# Patient Record
Sex: Female | Born: 1945 | Race: Black or African American | Hispanic: No | Marital: Single | Smoking: Former smoker
Health system: Southern US, Community
[De-identification: ages and names within clinical notes are randomized; demographics above are authoritative.]

## PROBLEM LIST (undated history)

## (undated) DIAGNOSIS — F0390 Unspecified dementia without behavioral disturbance: Secondary | ICD-10-CM

## (undated) DIAGNOSIS — N200 Calculus of kidney: Secondary | ICD-10-CM

## (undated) DIAGNOSIS — F32A Depression, unspecified: Secondary | ICD-10-CM

## (undated) DIAGNOSIS — K259 Gastric ulcer, unspecified as acute or chronic, without hemorrhage or perforation: Secondary | ICD-10-CM

## (undated) DIAGNOSIS — G8929 Other chronic pain: Secondary | ICD-10-CM

## (undated) DIAGNOSIS — R519 Headache, unspecified: Secondary | ICD-10-CM

## (undated) DIAGNOSIS — G4730 Sleep apnea, unspecified: Secondary | ICD-10-CM

## (undated) DIAGNOSIS — F514 Sleep terrors [night terrors]: Secondary | ICD-10-CM

## (undated) DIAGNOSIS — F329 Major depressive disorder, single episode, unspecified: Secondary | ICD-10-CM

## (undated) HISTORY — PX: REPLACEMENT TOTAL KNEE: SUR1224

## (undated) HISTORY — PX: JOINT REPLACEMENT: SHX530

## (undated) HISTORY — PX: OTHER SURGICAL HISTORY: SHX169

## (undated) HISTORY — DX: Calculus of kidney: N20.0

## (undated) HISTORY — DX: Gastric ulcer, unspecified as acute or chronic, without hemorrhage or perforation: K25.9

## (undated) HISTORY — DX: Sleep terrors (night terrors): F51.4

## (undated) HISTORY — DX: Depression, unspecified: F32.A

## (undated) HISTORY — DX: Sleep apnea, unspecified: G47.30

## (undated) HISTORY — DX: Other chronic pain: G89.29

## (undated) HISTORY — DX: Headache, unspecified: R51.9

---
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
## 2003-04-17 ENCOUNTER — Ambulatory Visit (HOSPITAL_COMMUNITY): Payer: Self-pay | Admitting: INTERNAL MEDICINE

## 2011-10-01 DIAGNOSIS — R079 Chest pain, unspecified: Secondary | ICD-10-CM

## 2011-10-01 DIAGNOSIS — G43009 Migraine without aura, not intractable, without status migrainosus: Secondary | ICD-10-CM

## 2017-10-09 ENCOUNTER — Encounter (HOSPITAL_BASED_OUTPATIENT_CLINIC_OR_DEPARTMENT_OTHER): Payer: Self-pay | Admitting: Internal Medicine

## 2017-10-09 NOTE — Progress Notes (Signed)
FORWARDED TO GRIFFITH

## 2017-10-22 ENCOUNTER — Telehealth (HOSPITAL_BASED_OUTPATIENT_CLINIC_OR_DEPARTMENT_OTHER): Payer: Self-pay | Admitting: Internal Medicine

## 2017-11-24 ENCOUNTER — Encounter (HOSPITAL_BASED_OUTPATIENT_CLINIC_OR_DEPARTMENT_OTHER): Payer: Self-pay | Admitting: Internal Medicine

## 2017-11-24 NOTE — Progress Notes (Signed)
RSC SENT TO REFERRING OFFICE

## 2017-12-27 DIAGNOSIS — F0390 Unspecified dementia without behavioral disturbance: Secondary | ICD-10-CM

## 2018-12-07 ENCOUNTER — Inpatient Hospital Stay (HOSPITAL_COMMUNITY): Payer: Medicare Other

## 2018-12-07 ENCOUNTER — Other Ambulatory Visit: Payer: Self-pay

## 2018-12-07 ENCOUNTER — Inpatient Hospital Stay (HOSPITAL_COMMUNITY)
Admission: EM | Admit: 2018-12-07 | Discharge: 2018-12-09 | DRG: 661 | Disposition: A | Discharge status: Home / Self Care | Payer: Medicare Other | Attending: Internal Medicine | Admitting: Internal Medicine

## 2018-12-07 ENCOUNTER — Emergency Department (HOSPITAL_COMMUNITY): Payer: Medicare Other

## 2018-12-07 ENCOUNTER — Inpatient Hospital Stay (HOSPITAL_COMMUNITY): Payer: Medicare Other | Admitting: Certified Registered Nurse Anesthetist

## 2018-12-07 ENCOUNTER — Encounter (HOSPITAL_COMMUNITY)
Admission: EM | Disposition: A | Discharge status: Home / Self Care | Payer: Self-pay | Source: Home / Self Care | Attending: Internal Medicine

## 2018-12-07 ENCOUNTER — Inpatient Hospital Stay: Admit: 2018-12-07 | Payer: Medicare Other | Admitting: Urology

## 2018-12-07 ENCOUNTER — Encounter (HOSPITAL_COMMUNITY): Payer: Self-pay | Admitting: Emergency Medicine

## 2018-12-07 DIAGNOSIS — R0602 Shortness of breath: Secondary | ICD-10-CM | POA: Diagnosis present

## 2018-12-07 DIAGNOSIS — F0390 Unspecified dementia without behavioral disturbance: Secondary | ICD-10-CM | POA: Diagnosis present

## 2018-12-07 DIAGNOSIS — E119 Type 2 diabetes mellitus without complications: Secondary | ICD-10-CM | POA: Diagnosis present

## 2018-12-07 DIAGNOSIS — N200 Calculus of kidney: Secondary | ICD-10-CM

## 2018-12-07 DIAGNOSIS — N390 Urinary tract infection, site not specified: Secondary | ICD-10-CM

## 2018-12-07 DIAGNOSIS — N1330 Unspecified hydronephrosis: Secondary | ICD-10-CM | POA: Diagnosis not present

## 2018-12-07 DIAGNOSIS — N132 Hydronephrosis with renal and ureteral calculous obstruction: Secondary | ICD-10-CM | POA: Diagnosis present

## 2018-12-07 HISTORY — PX: CYSTOSCOPY WITH STENT PLACEMENT: SHX5790

## 2018-12-07 LAB — COMPREHENSIVE METABOLIC PANEL
ALT: 10 U/L (ref 0–44)
AST: 20 U/L (ref 15–41)
Albumin: 4.2 g/dL (ref 3.5–5.0)
Alkaline Phosphatase: 86 U/L (ref 38–126)
Anion gap: 13 (ref 5–15)
BUN: 8 mg/dL (ref 8–23)
CO2: 21 mmol/L — ABNORMAL LOW (ref 22–32)
Calcium: 9.5 mg/dL (ref 8.9–10.3)
Chloride: 105 mmol/L (ref 98–111)
Creatinine, Ser: 0.94 mg/dL (ref 0.44–1.00)
GFR calc non Af Amer: >60 mL/min (ref >60)
Glucose, Bld: 133 mg/dL — ABNORMAL HIGH (ref 70–99)
Potassium: 3.7 mmol/L (ref 3.5–5.1)
Sodium: 139 mmol/L (ref 135–145)
Total Bilirubin: 0.8 mg/dL (ref 0.3–1.2)
Total Protein: 7.7 g/dL (ref 6.5–8.1)

## 2018-12-07 LAB — GLUCOSE, CAPILLARY: GLUCOSE-CAPILLARY: 111 mg/dL — AB (ref 70–99)

## 2018-12-07 LAB — URINALYSIS, ROUTINE W REFLEX MICROSCOPIC
Bilirubin Urine: NEGATIVE
Glucose, UA: NEGATIVE mg/dL
Ketones, ur: 5 mg/dL — AB
Nitrite: NEGATIVE
Protein, ur: NEGATIVE mg/dL
RBC / HPF: >50 RBC/hpf — ABNORMAL HIGH (ref 0–5)
Specific Gravity, Urine: 1.014 (ref 1.005–1.030)
pH: 8 (ref 5.0–8.0)

## 2018-12-07 LAB — CBC
HCT: 41 % (ref 36.0–46.0)
Hemoglobin: 13.1 g/dL (ref 12.0–15.0)
MCH: 29.2 pg (ref 26.0–34.0)
MCHC: 32 g/dL (ref 30.0–36.0)
MCV: 91.3 fL (ref 80.0–100.0)
Platelets: 209 10*3/uL (ref 150–400)
RBC: 4.49 MIL/uL (ref 3.87–5.11)
RDW: 14.5 % (ref 11.5–15.5)
WBC: 5.3 10*3/uL (ref 4.0–10.5)
nRBC: 0 % (ref 0.0–0.2)

## 2018-12-07 LAB — I-STAT TROPONIN, ED: Troponin i, poc: 0 ng/mL (ref 0.00–0.08)

## 2018-12-07 LAB — LIPASE, BLOOD: Lipase: 37 U/L (ref 11–51)

## 2018-12-07 SURGERY — CYSTOSCOPY, WITH STENT INSERTION
Anesthesia: General | Site: Ureter | Laterality: Right

## 2018-12-07 MED ORDER — SODIUM CHLORIDE 0.9 % IV SOLN
INTRAVENOUS | Status: DC
  Administered 2018-12-08 (×2): via INTRAVENOUS

## 2018-12-07 MED ORDER — FENTANYL CITRATE (PF) 100 MCG/2ML IJ SOLN
INTRAMUSCULAR | Status: AC
  Filled 2018-12-07: qty 2

## 2018-12-07 MED ORDER — LIDOCAINE 2% (20 MG/ML) 5 ML SYRINGE
INTRAMUSCULAR | Status: DC | PRN
  Administered 2018-12-07: 100 mg via INTRAVENOUS

## 2018-12-07 MED ORDER — ONDANSETRON HCL 4 MG PO TABS: 4.0000 mg | ORAL_TABLET | Freq: Four times a day (QID) | ORAL | Status: DC | PRN

## 2018-12-07 MED ORDER — HYDROMORPHONE HCL 1 MG/ML IJ SOLN: 0.5000 mg | INTRAMUSCULAR | Status: DC | PRN

## 2018-12-07 MED ORDER — IOHEXOL 300 MG/ML  SOLN
INTRAMUSCULAR | Status: DC | PRN
  Administered 2018-12-07: 7 mL via URETHRAL

## 2018-12-07 MED ORDER — SODIUM CHLORIDE 0.9 % IV SOLN
2.0000 g | Freq: Once | INTRAVENOUS | Status: AC
  Administered 2018-12-07: 2 g via INTRAVENOUS
  Filled 2018-12-07: qty 20

## 2018-12-07 MED ORDER — OXYCODONE-ACETAMINOPHEN 5-325 MG PO TABS: 1.0000 | ORAL_TABLET | Freq: Four times a day (QID) | ORAL | Status: DC | PRN

## 2018-12-07 MED ORDER — ONDANSETRON 4 MG PO TBDP
4.0000 mg | ORAL_TABLET | Freq: Once | ORAL | Status: AC
  Administered 2018-12-07: 4 mg via ORAL
  Filled 2018-12-07: qty 1

## 2018-12-07 MED ORDER — PHENAZOPYRIDINE HCL 200 MG PO TABS: 200.0000 mg | ORAL_TABLET | Freq: Three times a day (TID) | ORAL | 0 refills | Status: DC | PRN

## 2018-12-07 MED ORDER — FENTANYL CITRATE (PF) 100 MCG/2ML IJ SOLN
INTRAMUSCULAR | Status: DC | PRN
  Administered 2018-12-07: 50 ug via INTRAVENOUS

## 2018-12-07 MED ORDER — ONDANSETRON HCL 4 MG/2ML IJ SOLN
INTRAMUSCULAR | Status: DC | PRN
  Administered 2018-12-07: 4 mg via INTRAVENOUS

## 2018-12-07 MED ORDER — PROPOFOL 10 MG/ML IV BOLUS
INTRAVENOUS | Status: DC | PRN
  Administered 2018-12-07: 130 mg via INTRAVENOUS

## 2018-12-07 MED ORDER — DEXAMETHASONE SODIUM PHOSPHATE 10 MG/ML IJ SOLN
INTRAMUSCULAR | Status: DC | PRN
  Administered 2018-12-07: 10 mg via INTRAVENOUS

## 2018-12-07 MED ORDER — FENTANYL CITRATE (PF) 100 MCG/2ML IJ SOLN
50.0000 ug | Freq: Once | INTRAMUSCULAR | Status: AC
  Administered 2018-12-07: 50 ug via SUBCUTANEOUS

## 2018-12-07 MED ORDER — IOHEXOL 300 MG/ML  SOLN
100.0000 mL | Freq: Once | INTRAMUSCULAR | Status: AC | PRN
  Administered 2018-12-07: 100 mL via INTRAVENOUS

## 2018-12-07 MED ORDER — HYDROMORPHONE HCL 1 MG/ML IJ SOLN
0.5000 mg | Freq: Once | INTRAMUSCULAR | Status: AC
  Administered 2018-12-07: 0.5 mg via INTRAVENOUS
  Filled 2018-12-07: qty 1

## 2018-12-07 MED ORDER — SODIUM CHLORIDE 0.9 % IV BOLUS
1000.0000 mL | Freq: Once | INTRAVENOUS | Status: AC
  Administered 2018-12-07: 1000 mL via INTRAVENOUS

## 2018-12-07 MED ORDER — OXYCODONE-ACETAMINOPHEN 5-325 MG PO TABS
1.0000 | ORAL_TABLET | ORAL | Status: DC | PRN
  Administered 2018-12-07: 1 via ORAL
  Filled 2018-12-07: qty 1

## 2018-12-07 MED ORDER — FENTANYL CITRATE (PF) 100 MCG/2ML IJ SOLN
25.0000 ug | Freq: Once | INTRAMUSCULAR | Status: DC
  Filled 2018-12-07: qty 2

## 2018-12-07 MED ORDER — DEXAMETHASONE SODIUM PHOSPHATE 10 MG/ML IJ SOLN
INTRAMUSCULAR | Status: AC
  Filled 2018-12-07: qty 1

## 2018-12-07 MED ORDER — LIDOCAINE 2% (20 MG/ML) 5 ML SYRINGE
INTRAMUSCULAR | Status: AC
  Filled 2018-12-07: qty 5

## 2018-12-07 MED ORDER — PROPOFOL 10 MG/ML IV BOLUS
INTRAVENOUS | Status: AC
  Filled 2018-12-07: qty 20

## 2018-12-07 MED ORDER — SODIUM CHLORIDE 0.9% FLUSH
3.0000 mL | Freq: Once | INTRAVENOUS | Status: AC
  Administered 2018-12-07: 3 mL via INTRAVENOUS

## 2018-12-07 MED ORDER — KETOROLAC TROMETHAMINE 30 MG/ML IJ SOLN
INTRAMUSCULAR | Status: AC
  Filled 2018-12-07: qty 1

## 2018-12-07 MED ORDER — ONDANSETRON HCL 4 MG/2ML IJ SOLN: 4.0000 mg | Freq: Four times a day (QID) | INTRAMUSCULAR | Status: DC | PRN

## 2018-12-07 MED ORDER — TRAMADOL HCL 50 MG PO TABS: 50.0000 mg | ORAL_TABLET | Freq: Four times a day (QID) | ORAL | 0 refills | Status: DC | PRN

## 2018-12-07 MED ORDER — ACETAMINOPHEN 650 MG RE SUPP: 650.0000 mg | Freq: Four times a day (QID) | RECTAL | Status: DC | PRN

## 2018-12-07 MED ORDER — ONDANSETRON HCL 4 MG/2ML IJ SOLN
INTRAMUSCULAR | Status: AC
  Filled 2018-12-07: qty 2

## 2018-12-07 MED ORDER — SODIUM CHLORIDE 0.9 % IR SOLN
Status: DC | PRN
  Administered 2018-12-07: 3000 mL

## 2018-12-07 MED ORDER — LACTATED RINGERS IV SOLN
INTRAVENOUS | Status: DC | PRN
  Administered 2018-12-07: 20:00:00 via INTRAVENOUS

## 2018-12-07 MED ORDER — ACETAMINOPHEN 325 MG PO TABS: 650.0000 mg | ORAL_TABLET | Freq: Four times a day (QID) | ORAL | Status: DC | PRN

## 2018-12-07 SURGICAL SUPPLY — 11 items
BAG URO CATCHER STRL LF (MISCELLANEOUS) ×2 IMPLANT
CATH URET 5FR 28IN OPEN ENDED (CATHETERS) ×2 IMPLANT
CLOTH BEACON ORANGE TIMEOUT ST (SAFETY) ×2 IMPLANT
COVER WAND RF STERILE (DRAPES) IMPLANT
GLOVE BIOGEL M STRL SZ7.5 (GLOVE) ×2 IMPLANT
GOWN STRL REUS W/TWL LRG LVL3 (GOWN DISPOSABLE) ×4 IMPLANT
GUIDEWIRE STR DUAL SENSOR (WIRE) ×2 IMPLANT
MANIFOLD NEPTUNE II (INSTRUMENTS) ×2 IMPLANT
PACK CYSTO (CUSTOM PROCEDURE TRAY) ×2 IMPLANT
STENT URET 6FRX24 CONTOUR (STENTS) ×2 IMPLANT
TUBING CONNECTING 10 (TUBING) IMPLANT

## 2018-12-07 NOTE — H&P (Signed)
History and Physical    Monica Erickson EHO:[id number] DOB: Apr 05, 1946 DOA: 12/07/2018  PCP: Patient, No Pcp Per  Patient coming from: Home.  I have personally briefly reviewed patient's old medical records available.   Chief Complaint: Right flank pain.  HPI: Monica Erickson is a 73 y.o. female with medical history significant of advanced dementia not on any treatment, no significant medical problems presenting to the emergency room with 2 days of severe right flank pain.  Patient is poor historian.  I discussed with her daughter for further history.  According to them, patient does not have any history of renal stones.  She does not have any history of abdominal pain.  Patient started complaining of right flank pain about 2 days ago, gradually worsening, severe, dull and occasionally radiating to right groin.  Patient states occasional burning of urine.  No fever or chills.  Patient was brought to the ER by her daughter today. No reported fever.  No reported nausea vomiting or any other symptoms. ED Course: Patient is hemodynamically stable.  Her blood pressures are slightly elevated.  WBC count is normal.  Patient is afebrile.  A CT scan of the abdomen and pelvis shows multiple right ureteric stone with severe hydronephrosis and inflammation on the right side.  Case was discussed with urology.  Patient was given a dose of Rocephin 2 g.  Patient is started on IV fluids.  She will be transferred to [facility] for cystoscopy and stent placement tonight.  Review of Systems: As per HPI otherwise 10 point review of systems negative.  Somehow limited with advanced dementia.   Past Medical History:  Diagnosis Date  . Diabetes mellitus without complication (HCC)     History reviewed. No pertinent surgical history.   reports that she has never smoked. She does not have any smokeless tobacco history on file. She reports that she does not drink alcohol or use drugs.  No Known Allergies  History reviewed.  No pertinent family history.  No family history of kidney stones, renal malignancies.   Prior to Admission medications   Not on File  Patient does not take any medicine at home.  Physical Exam: Vitals:   12/07/18 1315 12/07/18 1400 12/07/18 1415 12/07/18 1430  BP:  140/83 127/70 (!) 146/75  Pulse: 90 79 77 80  Resp: 20 18 19 19   Temp:      TempSrc:      SpO2: 100% 100% 94% 100%  Weight:      Height:        Constitutional: NAD, calm, comfortable Vitals:   12/07/18 1315 12/07/18 1400 12/07/18 1415 12/07/18 1430  BP:  140/83 127/70 (!) 146/75  Pulse: 90 79 77 80  Resp: 20 18 19 19   Temp:      TempSrc:      SpO2: 100% 100% 94% 100%  Weight:      Height:       Eyes: PERRL, lids and conjunctivae normal ENMT: Mucous membranes are moist. Posterior pharynx clear of any exudate or lesions.Normal dentition.  Neck: normal, supple, no masses, no thyromegaly Respiratory: clear to auscultation bilaterally, no wheezing, no crackles. Normal respiratory effort. No accessory muscle use.  Cardiovascular: Regular rate and rhythm, no murmurs / rubs / gallops. No extremity edema. 2+ pedal pulses. No carotid bruits.  Abdomen: Patient does have diffuse tenderness along the right upper quadrant and right flank with no rigidity or guarding.  No hepatosplenomegaly. Bowel sounds positive.  Musculoskeletal: no clubbing / cyanosis. No  joint deformity upper and lower extremities. Good ROM, no contractures. Normal muscle tone.  Skin: no rashes, lesions, ulcers. No induration Neurologic: CN 2-12 grossly intact. Sensation intact, DTR normal. Strength 5/5 in all 4.  Psychiatric: Normal judgment and insight. Alert and oriented x 1.  Anxious.    Labs on Admission: I have personally reviewed following labs and imaging studies  CBC: Recent Labs  Lab 12/07/18 1030  WBC 5.3  HGB 13.1  HCT 41.0  MCV 91.3  PLT 209   Basic Metabolic Panel: Recent Labs  Lab 12/07/18 1030  NA 139  K 3.7  CL 105    CO2 21*  GLUCOSE 133*  BUN 8  CREATININE 0.94  CALCIUM 9.5   GFR: Estimated Creatinine Clearance: 53.4 mL/min (by C-G formula based on SCr of 0.94 mg/dL). Liver Function Tests: Recent Labs  Lab 12/07/18 1030  AST 20  ALT 10  ALKPHOS 86  BILITOT 0.8  PROT 7.7  ALBUMIN 4.2   Recent Labs  Lab 12/07/18 1030  LIPASE 37   No results for input(s): AMMONIA in the last 168 hours. Coagulation Profile: No results for input(s): INR, PROTIME in the last 168 hours. Cardiac Enzymes: No results for input(s): CKTOTAL, CKMB, CKMBINDEX, TROPONINI in the last 168 hours. BNP (last 3 results) No results for input(s): PROBNP in the last 8760 hours. HbA1C: No results for input(s): HGBA1C in the last 72 hours. CBG: No results for input(s): GLUCAP in the last 168 hours. Lipid Profile: No results for input(s): CHOL, HDL, LDLCALC, TRIG, CHOLHDL, LDLDIRECT in the last 72 hours. Thyroid Function Tests: No results for input(s): TSH, T4TOTAL, FREET4, T3FREE, THYROIDAB in the last 72 hours. Anemia Panel: No results for input(s): VITAMINB12, FOLATE, FERRITIN, TIBC, IRON, RETICCTPCT in the last 72 hours. Urine analysis:    Component Value Date/Time   COLORURINE YELLOW 12/07/2018 1306   APPEARANCEUR HAZY (A) 12/07/2018 1306   LABSPEC 1.014 12/07/2018 1306   PHURINE 8.0 12/07/2018 1306   GLUCOSEU NEGATIVE 12/07/2018 1306   HGBUR MODERATE (A) 12/07/2018 1306   BILIRUBINUR NEGATIVE 12/07/2018 1306   KETONESUR 5 (A) 12/07/2018 1306   PROTEINUR NEGATIVE 12/07/2018 1306   NITRITE NEGATIVE 12/07/2018 1306   LEUKOCYTESUR TRACE (A) 12/07/2018 1306    Radiological Exams on Admission: Dg Chest 2 View  Result Date: 12/07/2018 CLINICAL DATA:  Severe RIGHT upper quadrant pain. Shortness of breath. EXAM: CHEST - 2 VIEW COMPARISON:  None. FINDINGS: Cardiac silhouette is mildly enlarged. Mediastinal silhouette is normal. Mild bronchitic changes. No pleural effusion or focal consolidation. LEFT lower lung  zone subpleural scarring. No pneumothorax. Soft tissue planes and included osseous structures are non suspicious. IMPRESSION: 1. Mild cardiomegaly. 2. Mild bronchitic changes without focal consolidation. Electronically Signed   By: Awilda Metro M.D.   On: 12/07/2018 13:46   Ct Abdomen Pelvis W Contrast  Result Date: 12/07/2018 CLINICAL DATA:  73 year old female with right flank and right lower quadrant pain. Initial encounter. EXAM: CT ABDOMEN AND PELVIS WITH CONTRAST TECHNIQUE: Multidetector CT imaging of the abdomen and pelvis was performed using the standard protocol following bolus administration of intravenous contrast. CONTRAST:  OMNIPAQUE IOHEXOL 300 MG/ML  SOLN COMPARISON:  None. FINDINGS: Lower chest: Basilar scarring/atelectasis. Heart size top-normal slightly enlarged. Slightly asymmetric breast parenchyma more notable on left. Hepatobiliary: Slightly enlarged liver spanning over 18.8 cm. No worrisome hepatic lesion noted. No calcified gallstone. Motion degradation limits evaluation for detection of gallbladder inflammation. Pancreas: No worrisome pancreatic mass or primary pancreatic inflammation. Spleen: No  splenic mass or enlargement. Splenule incidentally noted. Adrenals/Urinary Tract: 3 mm right ureteral vesicle obstructing stone. Additionally, 6 mm and 5 mm stone within the distal right ureter 6.2 cm proximal to the ureteral vesicle junction. Marked right-sided hydroureteronephrosis. Extravasation of urine surrounding the right kidney which appears diffusely edematous with delayed nephrogram. No worrisome renal or adrenal mass. Noncontrast filled imaging of the urinary bladder without mass identified. Stomach/Bowel: Diverticulosis most notable descending colon and sigmoid colon. No evidence of diverticulitis. Appendix not well delineated however no right lower lobe primary bowel inflammatory process noted. No obvious small bowel or gastric abnormality. Vascular/Lymphatic:  Atherosclerotic changes aorta without abdominal aortic aneurysm. No large vessel occlusion. Scattered normal size lymph nodes increased in number upper abdominal/peripancreatic region of indeterminate etiology. Reproductive: Appearance of prior hysterectomy. No worrisome adnexal lesion. Other: No free air or bowel containing hernia. Small fat containing umbilical hernia. Musculoskeletal: Mild scoliosis with superimposed degenerative changes most notable T11-12. IMPRESSION: 1. 3 mm right ureteral vesicle obstructing stone. Additionally, 6 mm and 5 mm stone within the distal right ureter 6.2 cm proximal to the ureteral vesicle junction. Marked right-sided hydroureteronephrosis. Extravasation of urine surrounding the right kidney which appears diffusely edematous with delayed nephrogram. 2. Diverticulosis most notable descending colon and sigmoid colon. No evidence of diverticulitis. Appendix not well delineated however no right lower lobe primary bowel inflammatory process noted. 3. Scattered normal size lymph nodes increased in number upper abdominal/peripancreatic region of indeterminate etiology. 4.  Aortic Atherosclerosis (ICD10-I70.0). 5. Slightly enlarged liver spanning over 18.8 cm. 6. Exam is motion degraded slightly limiting evaluation particularly in the upper abdomen. These results were called by telephone at the time of interpretation on 12/07/2018 at 3:05 pm to Dr. Kennis CarinaMICHAEL BERO , who verbally acknowledged these results. Electronically Signed   By: Lacy DuverneySteven  Olson M.D.   On: 12/07/2018 15:08    EKG: Independently reviewed. Normal sinus rhythm.  Low voltage EKG.  No acute ST-T wave changes.  Assessment/Plan Principal Problem:   Stone, kidney Active Problems:   Dementia without behavioral disturbance (HCC)   Obstructive ureteric stones with severe hydronephrosis: Agree with admission given severity of symptoms. Patient was given Rocephin 2 g after getting urine cultures and blood cultures.  I  discussed case with urology, they do not suggest to continue antibiotics. Continue IV fluids.  Keep n.p.o.  Adequate IV and oral opiates for pain relief. [facility].  Dementia without behavioral disturbances: Patient currently will controlled behavior.  She is not on any treatment.  Fall prevention.  Delirium precaution.  DVT prophylaxis: SCDs presurgery. Code Status: Full code. Family Communication: Discussed with daughter over the phone. Disposition Plan: Home in 24 to 48 hours. Consults called: Urology. Admission status: Inpatient.   Dorcas CarrowKuber Jeanene Mena MD [facility] Pager [phone]  If 7PM-7AM, please contact night-coverage [email or web address] Password [email or web address]  12/07/2018, 5:44 PM

## 2018-12-07 NOTE — ED Notes (Signed)
Please call dtr with updates [phone] crystal

## 2018-12-07 NOTE — ED Notes (Signed)
Pt keeps removing BP cuff. Stating it is too tight. Educated pt on reason for BP checks. Pt continues to removes cuff

## 2018-12-07 NOTE — Anesthesia Preprocedure Evaluation (Addendum)
Anesthesia Evaluation  Patient identified by MRN, date of birth, ID band Patient awake    Reviewed: Allergy & Precautions, H&P , NPO status , Patient's Chart, lab work & pertinent test results  Airway Mallampati: II  TM Distance: >3 FB Neck ROM: Full    Dental no notable dental hx. (+) Upper Dentures, Lower Dentures, Dental Advisory Given   Pulmonary neg pulmonary ROS,    Pulmonary exam normal breath sounds clear to auscultation       Cardiovascular negative cardio ROS   Rhythm:Regular Rate:Normal     Neuro/Psych Dementia negative neurological ROS     GI/Hepatic negative GI ROS, Neg liver ROS,   Endo/Other  diabetes  Renal/GU Renal disease  negative genitourinary   Musculoskeletal   Abdominal   Peds  Hematology negative hematology ROS (+)   Anesthesia Other Findings   Reproductive/Obstetrics negative OB ROS                            Anesthesia Physical Anesthesia Plan  ASA: II  Anesthesia Plan: General   Post-op Pain Management:    Induction: Intravenous  PONV Risk Score and Plan: 4 or greater and Ondansetron, Dexamethasone and Treatment may vary due to age or medical condition  Airway Management Planned: LMA  Additional Equipment:   Intra-op Plan:   Post-operative Plan: Extubation in OR  Informed Consent: I have reviewed the patients History and Physical, chart, labs and discussed the procedure including the risks, benefits and alternatives for the proposed anesthesia with the patient or authorized representative who has indicated his/her understanding and acceptance.     Dental advisory given  Plan Discussed with: CRNA  Anesthesia Plan Comments:         Anesthesia Quick Evaluation

## 2018-12-07 NOTE — Discharge Instructions (Signed)
DISCHARGE INSTRUCTIONS FOR KIDNEY STONE/URETERAL STENT   MEDICATIONS:  1. Resume all your other meds from home - except do not take any extra narcotic pain meds that you may have at home.    ACTIVITY:  1. No strenuous activity x 1week  2. No driving while on narcotic pain medications  3. Drink plenty of water  4. Continue to walk at home - you can still get blood clots when you are at home, so keep active, but don't over do it.  5. May return to work/school tomorrow or when you feel ready   BATHING:  1. You can shower and we recommend daily showers  2. You have a string coming from your urethra: The stent string is attached to your ureteral stent. Do not pull on this.   SIGNS/SYMPTOMS TO CALL:  Please call us if you have a fever greater than 101.5, uncontrolled nausea/vomiting, uncontrolled pain, dizziness, unable to urinate, bloody urine, chest pain, shortness of breath, leg swelling, leg pain, redness around wound, drainage from wound, or any other concerns or questions.   You can reach us at [phone].   FOLLOW-UP:  1. You will be contacted with a follow-up surgery appointment.

## 2018-12-07 NOTE — ED Triage Notes (Signed)
Onset 2-3 days ago developed general abdominal pain radiating to right flank pain. Pain 10/10 sharp tearful during triage. Denies nausea, emesis, diarrhea.

## 2018-12-07 NOTE — ED Provider Notes (Signed)
[facility] Emergency Department Provider Note MRN:  [id number]  Arrival date & time: 12/08/18     Chief Complaint   Abdominal Pain and Flank Pain   History of Present Illness   Monica Erickson is a 73 y.o. year-old female with a history of dementia, diabetes presenting to the ED with chief complaint of abdominal pain.  Pain is located in the right flank and right lower quadrant, began 2 to 3 days ago, initially mild to moderate, became severe today.  Described as 10 out of 10 in severity, sharp.  Denies recent fever, no vomiting, no diarrhea.  During the severe pain, also experienced shortness of breath, a few moments of sharp central chest pain that quickly resolved.  Denies history of kidney stone.  Review of Systems  A complete 10 system review of systems was obtained and all systems are negative except as noted in the HPI and PMH.   Patient's Health History    Past Medical History:  Diagnosis Date  . Diabetes mellitus without complication [facility](HCC)     Past Surgical History:  Procedure Laterality Date  . CYSTOSCOPY WITH STENT PLACEMENT Right 12/07/2018   Procedure: CYSTOSCOPY WITH STENT PLACEMENT;  Surgeon: Crist FatHerrick, Benjamin W, MD;  Location: [facility];  Service: Urology;  Laterality: Right;    History reviewed. No pertinent family history.  Social History   Socioeconomic History  . Marital status: Single    Spouse name: Not on file  . Number of children: Not on file  . Years of education: Not on file  . Highest education level: Not on file  Occupational History  . Not on file  Social Needs  . Financial resource strain: Not on file  . Food insecurity:    Worry: Not on file    Inability: Not on file  . Transportation needs:    Medical: Not on file    Non-medical: Not on file  Tobacco Use  . Smoking status: Never Smoker  Substance and Sexual Activity  . Alcohol use: Never    Frequency: Never  . Drug use: Never  . Sexual activity: Not on file  Lifestyle    . Physical activity:    Days per week: Not on file    Minutes per session: Not on file  . Stress: Not on file  Relationships  . Social connections:    Talks on phone: Not on file    Gets together: Not on file    Attends religious service: Not on file    Active member of club or organization: Not on file    Attends meetings of clubs or organizations: Not on file    Relationship status: Not on file  . Intimate partner violence:    Fear of current or ex partner: Not on file    Emotionally abused: Not on file    Physically abused: Not on file    Forced sexual activity: Not on file  Other Topics Concern  . Not on file  Social History Narrative  . Not on file     Physical Exam  Vital Signs and Nursing Notes reviewed Vitals:   12/07/18 2146 12/08/18 0355  BP: (!) 156/90 115/88  Pulse: (!) 54 63  Resp: 18 18  Temp: 99 F (37.2 C) 98.5 F (36.9 C)  SpO2: 99% 96%    CONSTITUTIONAL: Well-appearing, NAD NEURO:  Alert and oriented x 3, no focal deficits EYES:  eyes equal and reactive ENT/NECK:  no LAD, no JVD CARDIO: Regular  rate, well-perfused, normal S1 and S2 PULM:  CTAB no wheezing or rhonchi GI/GU:  normal bowel sounds, non-distended, mild tenderness palpation to the right lower quadrant MSK/SPINE:  No gross deformities, no edema SKIN:  no rash, atraumatic PSYCH:  Appropriate speech and behavior  Diagnostic and Interventional Summary    EKG Interpretation  Date/Time:  Tuesday December 07 2018 12:16:36 EST Ventricular Rate:  59 PR Interval:    QRS Duration: 105 QT Interval:  441 QTC Calculation: 437 R Axis:   72 Text Interpretation:  Sinus rhythm Low voltage, precordial leads Consider anterior infarct Confirmed by Kennis Carina [phone]) on 12/07/2018 12:47:10 PM      Labs Reviewed  COMPREHENSIVE METABOLIC PANEL - Abnormal; Notable for the following components:      Result Value   CO2 21 (*)    Glucose, Bld 133 (*)    All other components within normal limits   URINALYSIS, ROUTINE W REFLEX MICROSCOPIC - Abnormal; Notable for the following components:   APPearance HAZY (*)    Hgb urine dipstick MODERATE (*)    Ketones, ur 5 (*)    Leukocytes, UA TRACE (*)    RBC / HPF >50 (*)    Bacteria, UA FEW (*)    All other components within normal limits  GLUCOSE, CAPILLARY - Abnormal; Notable for the following components:   Glucose-Capillary 111 (*)    All other components within normal limits  GLUCOSE, CAPILLARY - Abnormal; Notable for the following components:   Glucose-Capillary 167 (*)    All other components within normal limits  LIPASE, BLOOD  CBC  BASIC METABOLIC PANEL  CBC  I-STAT TROPONIN, ED    DG C-Arm 1-60 Min-No Report  Final Result    CT ABDOMEN PELVIS W CONTRAST  Final Result    DG Chest 2 View  Final Result      Medications  0.9 %  sodium chloride infusion ( Intravenous New Bag/Given 12/08/18 0225)  acetaminophen (TYLENOL) tablet 650 mg ( Oral MAR Unhold 12/07/18 2140)    Or  acetaminophen (TYLENOL) suppository 650 mg ( Rectal MAR Unhold 12/07/18 2140)  ondansetron (ZOFRAN) tablet 4 mg ( Oral MAR Unhold 12/07/18 2140)    Or  ondansetron (ZOFRAN) injection 4 mg ( Intravenous MAR Unhold 12/07/18 2140)  oxyCODONE-acetaminophen (PERCOCET/ROXICET) 5-325 MG per tablet 1 tablet (has no administration in time range)  HYDROmorphone (DILAUDID) injection 0.5 mg (has no administration in time range)  sodium chloride flush (NS) 0.9 % injection 3 mL (3 mLs Intravenous Given 12/07/18 1313)  sodium chloride 0.9 % bolus 1,000 mL (0 mLs Intravenous Stopped 12/07/18 1458)  fentaNYL (SUBLIMAZE) injection 50 mcg (50 mcg Subcutaneous Given 12/07/18 1235)  ondansetron (ZOFRAN-ODT) disintegrating tablet 4 mg (4 mg Oral Given 12/07/18 1237)  iohexol (OMNIPAQUE) 300 MG/ML solution 100 mL (100 mLs Intravenous Contrast Given 12/07/18 1433)  cefTRIAXone (ROCEPHIN) 2 g in sodium chloride 0.9 % 100 mL IVPB (0 g Intravenous Stopped 12/07/18 1645)  HYDROmorphone  (DILAUDID) injection 0.5 mg (0.5 mg Intravenous Given 12/07/18 1454)  HYDROmorphone (DILAUDID) injection 0.5 mg (0.5 mg Intravenous Given 12/07/18 1850)     Procedures Critical Care Critical Care Documentation Critical care time provided by me (excluding procedures): 38 minutes  Condition necessitating critical care: Infected kidney stone with severe hydronephrosis and urine extravasation  Components of critical care management: reviewing of prior records, laboratory and imaging interpretation, frequent re-examination and reassessment of vital signs, administration of IV fluids, IV antibiotics, discussion with consulting services.    ED  Course and Medical Decision Making  I have reviewed the triage vital signs and the nursing notes.  Pertinent labs & imaging results that were available during my care of the patient were reviewed by me and considered in my medical decision making (see below for details).  Considering pyelonephritis versus kidney stone versus appendicitis versus right-sided diverticulitis in this 73 year old female with worsening right-sided abdominal/flank pain.  Labs, CT pending.  CT reveals obstructing kidney stone, extensive hydronephrosis, UA concerning for infection.  Urology consulted for possible stent or nephrostomy tube placement, admitted to hospital service for further care.  Elmer Sow M. Pilar PlateBero, MD [facility] [email or web address]  Final Clinical Impressions(s) / ED Diagnoses     ICD-10-CM   1. Kidney stone N20.0   2. SOB (shortness of breath) R06.02 DG Chest 2 View    DG Chest 2 View  3. Stone, kidney N20.0   4. Hydronephrosis, unspecified hydronephrosis type N13.30   5. Lower urinary tract infectious disease N39.0     ED Discharge Orders         Ordered    traMADol (ULTRAM) 50 MG tablet  Every 6 hours PRN     12/07/18 2134    phenazopyridine (PYRIDIUM) 200 MG tablet  3 times daily PRN     12/07/18 2134               Sabas SousBero,  M, MD 12/08/18 [id number]

## 2018-12-07 NOTE — Anesthesia Postprocedure Evaluation (Signed)
Anesthesia Post Note  Patient: Donni Borde  Procedure(s) Performed: CYSTOSCOPY WITH STENT PLACEMENT (Right Ureter)     Patient location during evaluation: PACU Anesthesia Type: General Level of consciousness: awake and alert Pain management: pain level controlled Vital Signs Assessment: post-procedure vital signs reviewed and stable Respiratory status: spontaneous breathing, nonlabored ventilation and respiratory function stable Cardiovascular status: blood pressure returned to baseline and stable Postop Assessment: no apparent nausea or vomiting Anesthetic complications: no    Last Vitals:  Vitals:   12/07/18 2125 12/07/18 2146  BP: (!) 151/78 (!) 156/90  Pulse: 69 (!) 54  Resp: 19 18  Temp: 37 C 37.2 C  SpO2: 94% 99%    Last Pain:  Vitals:   12/07/18 2146  TempSrc: Oral  PainSc:                  Thi Sisemore,W. EDMOND

## 2018-12-07 NOTE — ED Notes (Signed)
Report given to pam

## 2018-12-07 NOTE — Progress Notes (Signed)
The patient is status post right ureteral stent placement for obstructing stones.  The case was unremarkable, the patient was severely obstructed, but there was no purulence or evidence of infection behind the stones.  The patient awoke from the procedure and felt remarkably better.  Our plan is to have the patient follow-up in 7 to 10 days for completion ureteroscopy, and removal of all her stones.  It is unclear to me that the patient actually had an infection, I think that the CT scan is a reflection of severe high-grade obstruction.  There is no other objective information that would suggest infection.  Would consider treating the patient with broad-spectrum oral antibiotics for a week, but do not feel strongly that the patient needs to be in the hospital until her cultures result.

## 2018-12-07 NOTE — Transfer of Care (Signed)
Immediate Anesthesia Transfer of Care Note  Patient: Monica Erickson  Procedure(s) Performed: CYSTOSCOPY WITH STENT PLACEMENT (Right Ureter)  Patient Location: PACU  Anesthesia Type:General  Level of Consciousness: drowsy and patient cooperative  Airway & Oxygen Therapy: Patient Spontanous Breathing and Patient connected to face mask oxygen  Post-op Assessment: Report given to RN and Post -op Vital signs reviewed and stable  Post vital signs: Reviewed and stable  Last Vitals:  Vitals Value Taken Time  BP 138/81 12/07/2018  8:45 PM  Temp    Pulse 62 12/07/2018  8:47 PM  Resp 9 12/07/2018  8:47 PM  SpO2 100 % 12/07/2018  8:47 PM  Vitals shown include unvalidated device data.  Last Pain:  Vitals:   12/07/18 1908  TempSrc:   PainSc: 6          Complications: No apparent anesthesia complications

## 2018-12-07 NOTE — Op Note (Signed)
Preoperative diagnosis:  1. Obstructing right ureteral stones   Postoperative diagnosis:  1. same   Procedure:  1. Cystoscopy 2. right ureteral stent placement 3. right retrograde pyelography with interpretation   Surgeon: Crist Fat, MD  Anesthesia: General  Complications: None  Intraoperative findings:  right retrograde pyelography demonstrated a filling defect within the right ureter consistent with the patient's known calculus without other abnormalities.  EBL: Minimal  Specimens: None  Indication: Monica Erickson is a 73 y.o. patient with 3 right ureteral stones with associated obstruction, hydroureteronephrosis, and uncontrolled pain.  There was also some question of whether the patient had an infection. After reviewing the management options for treatment, he elected to proceed with the above surgical procedure(s). We have discussed the potential benefits and risks of the procedure, side effects of the proposed treatment, the likelihood of the patient achieving the goals of the procedure, and any potential problems that might occur during the procedure or recuperation. Informed consent has been obtained.  Description of procedure:  The patient was taken to the operating room and general anesthesia was induced.  The patient was placed in the dorsal lithotomy position, prepped and draped in the usual sterile fashion, and preoperative antibiotics were administered. A preoperative time-out was performed.   Cystourethroscopy was performed.  The patient's urethra was examined and was norma. The bladder was then systematically examined in its entirety. There was no evidence for any bladder tumors, stones, or other mucosal pathology.    Attention then turned to the rightureteral orifice and a ureteral catheter was used to intubate the ureteral orifice.  Omnipaque contrast was injected through the ureteral catheter and a retrograde pyelogram was performed with findings as dictated  above.  A 0.38 sensor guidewire was then advanced up the right ureter into the renal pelvis under fluoroscopic guidance.  The wire was then backloaded through the cystoscope and a ureteral stent was advance over the wire using Seldinger technique.  The stent was positioned appropriately under fluoroscopic and cystoscopic guidance.  The wire was then removed with an adequate stent curl noted in the renal pelvis as well as in the bladder.  The bladder was then emptied and the procedure ended.  The patient appeared to tolerate the procedure well and without complications.  The patient was able to be awakened and transferred to the recovery unit in satisfactory condition.    Crist Fat, M.D.

## 2018-12-07 NOTE — Consult Note (Signed)
I have been asked to see the patient by Dr. Kennis Carina, for evaluation and management of right ureteral obstructing stones.  History of present illness: 73 year old female who presented to the emergency department with ongoing right-sided pain.  The pain is been ongoing for 2 to 3 days.  She was having associated nausea and vomiting.  The pain was 10/10 associated with the diarrhea.  She was not having any fevers.  She does have a history of dementia, and her daughters taken care of her.  In the emergency department a CT scan was obtained demonstrating a 3 mm stone at the right UVJ as well as a 5 and a 6 mm stone in the mid ureter on the right with severe hydroureteronephrosis and what appears like a forniceal rupture.  The patient urine analysis was consistent with contamination, did not appear to be infected.  The patient's white blood cell count was within normal limits and there was no evidence of fever.  At the time of my interview of the patient she was unable to answer most questions because of the medication she had been given in association with her baseline dementia.  Her daughters were not present.  Review of systems: A 12 point comprehensive review of systems was obtained and is negative unless otherwise stated in the history of present illness.  Patient Active Problem List   Diagnosis Date Noted  . Stone, kidney 12/07/2018  . Dementia without behavioral disturbance (HCC) 12/07/2018    No current facility-administered medications on file prior to encounter.    No current outpatient medications on file prior to encounter.    Past Medical History:  Diagnosis Date  . Diabetes mellitus without complication (HCC)     History reviewed. No pertinent surgical history.  Social History   Tobacco Use  . Smoking status: Never Smoker  Substance Use Topics  . Alcohol use: Never    Frequency: Never  . Drug use: Never    History reviewed. No pertinent family  history.  PE: Vitals:   12/07/18 1315 12/07/18 1400 12/07/18 1415 12/07/18 1430  BP:  140/83 127/70 (!) 146/75  Pulse: 90 79 77 80  Resp: 20 18 19 19   Temp:      TempSrc:      SpO2: 100% 100% 94% 100%  Weight:      Height:       Patient appears to be in no acute distress  patient is alert and oriented x3 Atraumatic normocephalic head No cervical or supraclavicular lymphadenopathy appreciated No increased work of breathing, no audible wheezes/rhonchi Regular sinus rhythm/rate Abdomen is soft, nondistended, severe right CVA tenderness and right lower abdominal pain  Lower extremities are symmetric without appreciable edema Grossly neurologically intact No identifiable skin lesions  Recent Labs    12/07/18 1030  WBC 5.3  HGB 13.1  HCT 41.0   Recent Labs    12/07/18 1030  NA 139  K 3.7  CL 105  CO2 21*  GLUCOSE 133*  BUN 8  CREATININE 0.94  CALCIUM 9.5   No results for input(s): LABPT, INR in the last 72 hours. No results for input(s): LABURIN in the last 72 hours. No results found for this or any previous visit.  Imaging: CT scan: I have independently reviewed the scan which demonstrates a 3 mm stone at the right UVJ as well as a 5 and a 6 mm stone in the mid ureter on the right with severe hydroureteronephrosis and what appears like a forniceal rupture.  Imp: Several obstructing right mid ureteral stones and lower UVJ stone with associated forniceal rupture and severe hydroureteronephrosis.  Based on the patient's clinical information I do not think she has any evidence of infection.  She certainly is in quite a bit of pain.  Stone burden is enough that she is unlikely to pass the stones in the near future.  Recommendations: Plan is to transfer the patient to [facility] long then take the patient urgently to the operating room for cystoscopy and right ureteral stent placement.  Once the patient has recovered from the forniceal rupture and possible infection we will  plan to remove her stone in the next 7 to 10 days.  Thank you for involving me in this patient's care, Please page with any further questions or concerns. Crist Fat

## 2018-12-07 NOTE — Anesthesia Procedure Notes (Signed)
Procedure Name: LMA Insertion Date/Time: 12/07/2018 8:20 PM Performed by: Epimenio Sarin, CRNA Pre-anesthesia Checklist: Patient identified, Emergency Drugs available, Suction available, Patient being monitored and Timeout performed Patient Re-evaluated:Patient Re-evaluated prior to induction Oxygen Delivery Method: Circle system utilized Preoxygenation: Pre-oxygenation with 100% oxygen Induction Type: IV induction LMA: LMA inserted LMA Size: 4.0 Number of attempts: 1 Dental Injury: Teeth and Oropharynx as per pre-operative assessment

## 2018-12-08 ENCOUNTER — Other Ambulatory Visit: Payer: Self-pay

## 2018-12-08 ENCOUNTER — Encounter (HOSPITAL_COMMUNITY): Payer: Self-pay | Admitting: Urology

## 2018-12-08 DIAGNOSIS — N1330 Unspecified hydronephrosis: Secondary | ICD-10-CM

## 2018-12-08 LAB — GLUCOSE, CAPILLARY: Glucose-Capillary: 167 mg/dL — ABNORMAL HIGH (ref 70–99)

## 2018-12-08 MED ORDER — TRAZODONE HCL 50 MG PO TABS
25.0000 mg | ORAL_TABLET | Freq: Once | ORAL | Status: AC
  Administered 2018-12-08: 25 mg via ORAL
  Filled 2018-12-08: qty 1

## 2018-12-08 MED ORDER — SODIUM CHLORIDE 0.9 % IV SOLN
1.0000 g | Freq: Once | INTRAVENOUS | Status: AC
  Administered 2018-12-08: 1 g via INTRAVENOUS
  Filled 2018-12-08: qty 1

## 2018-12-08 NOTE — Progress Notes (Signed)
PROGRESS NOTE    Monica Erickson  ZOX:[id number] DOB: 10/09/1946 DOA: 12/07/2018 PCP: Patient, No Pcp Per    Brief Narrative: Monica SaltLoretta Lacewell is a 73 y.o. female with medical history significant of advanced dementia not on any treatment, no significant medical problems presenting to the emergency room with 2 days of severe right flank pain.  A CT scan of the abdomen and pelvis shows multiple right ureteric stone with severe hydronephrosis and inflammation on the right side.  Case was discussed with urology.  Patient was given a dose of Rocephin 2 g.  Patient is started on IV fluids.  [facility] cyst[facility] and stent placement.  She is s/p right ureteral stent for obstructing stones she will need follow-up with urology for next 7 to 10 days for completion ureteroscopy and remove overall of all her stones.  Urology recommended broad-spectrum active biotics for 1 week.  Assessment & Plan:   Principal Problem:   Stone, kidney Active Problems:   Dementia without behavioral disturbance (HCC)   Severe hydronephrosis secondary to obstruction from ureteric stones on the right S/p right  ureteral stent by urology on 12/07/2018 Continue with Rocephin and IV fluids. Continue with pain medication. Will discharge tomorrow.   DVT prophylaxis: SCDs Code Status: Full code Family Communication: Discussed with daughter over the phone Disposition Plan: (Possible discharge tomorrow  Consultants:   Urology  Procedures: Status post ureteral stent by urology Antimicrobials: Rocephin  Subjective: Pain not well controlled, spasms on the right side  Objective: Vitals:   12/07/18 2115 12/07/18 2125 12/07/18 2146 12/08/18 0355  BP: (!) 149/73 (!) 151/78 (!) 156/90 115/88  Pulse: 64 69 (!) 54 63  Resp: 17 19 18 18   Temp:  98.6 F (37 C) 99 F (37.2 C) 98.5 F (36.9 C)  TempSrc:   Oral Oral  SpO2: 97% 94% 99% 96%  Weight:   87.7 kg   Height:        Intake/Output Summary (Last  24 hours) at 12/08/2018 1400 Last data filed at 12/07/2018 2040 Gross per 24 hour  Intake 1650 ml  Output 0 ml  Net 1650 ml   Filed Weights   12/07/18 1007 12/07/18 2146  Weight: 72.6 kg 87.7 kg    Examination:  General exam: Appears calm and comfortable  Respiratory system: Clear to auscultation. Respiratory effort normal. Cardiovascular system: S1 & S2 heard, RRR. No JVD, murmurs, Gastrointestinal system: Abdomen is nondistended, soft and nontender. No organomegaly or masses felt. Normal bowel sounds heard. Central nervous system: Alert and oriented. No focal neurological deficits. Extremities: Symmetric 5 x 5 power. Skin: No rashes, lesions or ulcers Psychiatry: . Mood & affect appropriate.     Data Reviewed: I have personally reviewed following labs and imaging studies  CBC: Recent Labs  Lab 12/07/18 1030  WBC 5.3  HGB 13.1  HCT 41.0  MCV 91.3  PLT 209   Basic Metabolic Panel: Recent Labs  Lab 12/07/18 1030  NA 139  K 3.7  CL 105  CO2 21*  GLUCOSE 133*  BUN 8  CREATININE 0.94  CALCIUM 9.5   GFR: Estimated Creatinine Clearance: 62.4 mL/min (by C-G formula based on SCr of 0.94 mg/dL). Liver Function Tests: Recent Labs  Lab 12/07/18 1030  AST 20  ALT 10  ALKPHOS 86  BILITOT 0.8  PROT 7.7  ALBUMIN 4.2   Recent Labs  Lab 12/07/18 1030  LIPASE 37   No results for input(s): AMMONIA in the last 168  hours. Coagulation Profile: No results for input(s): INR, PROTIME in the last 168 hours. Cardiac Enzymes: No results for input(s): CKTOTAL, CKMB, CKMBINDEX, TROPONINI in the last 168 hours. BNP (last 3 results) No results for input(s): PROBNP in the last 8760 hours. HbA1C: No results for input(s): HGBA1C in the last 72 hours. CBG: Recent Labs  Lab 12/07/18 2050 12/08/18 0000  GLUCAP 111* 167*   Lipid Profile: No results for input(s): CHOL, HDL, LDLCALC, TRIG, CHOLHDL, LDLDIRECT in the last 72 hours. Thyroid Function Tests: No results for  input(s): TSH, T4TOTAL, FREET4, T3FREE, THYROIDAB in the last 72 hours. Anemia Panel: No results for input(s): VITAMINB12, FOLATE, FERRITIN, TIBC, IRON, RETICCTPCT in the last 72 hours. Sepsis Labs: No results for input(s): PROCALCITON, LATICACIDVEN in the last 168 hours.  No results found for this or any previous visit (from the past 240 hour(s)).       Radiology Studies: Dg Chest 2 View  Result Date: 12/07/2018 CLINICAL DATA:  Severe RIGHT upper quadrant pain. Shortness of breath. EXAM: CHEST - 2 VIEW COMPARISON:  None. FINDINGS: Cardiac silhouette is mildly enlarged. Mediastinal silhouette is normal. Mild bronchitic changes. No pleural effusion or focal consolidation. LEFT lower lung zone subpleural scarring. No pneumothorax. Soft tissue planes and included osseous structures are non suspicious. IMPRESSION: 1. Mild cardiomegaly. 2. Mild bronchitic changes without focal consolidation. Electronically Signed   By: Awilda Metroourtnay  Bloomer M.D.   On: 12/07/2018 13:46   Ct Abdomen Pelvis W Contrast  Result Date: 12/07/2018 CLINICAL DATA:  73 year old female with right flank and right lower quadrant pain. Initial encounter. EXAM: CT ABDOMEN AND PELVIS WITH CONTRAST TECHNIQUE: Multidetector CT imaging of the abdomen and pelvis was performed using the standard protocol following bolus administration of intravenous contrast. CONTRAST:  100mL OMNIPAQUE IOHEXOL 300 MG/ML  SOLN COMPARISON:  None. FINDINGS: Lower chest: Basilar scarring/atelectasis. Heart size top-normal slightly enlarged. Slightly asymmetric breast parenchyma more notable on left. Hepatobiliary: Slightly enlarged liver spanning over 18.8 cm. No worrisome hepatic lesion noted. No calcified gallstone. Motion degradation limits evaluation for detection of gallbladder inflammation. Pancreas: No worrisome pancreatic mass or primary pancreatic inflammation. Spleen: No splenic mass or enlargement. Splenule incidentally noted. Adrenals/Urinary Tract: 3  mm right ureteral vesicle obstructing stone. Additionally, 6 mm and 5 mm stone within the distal right ureter 6.2 cm proximal to the ureteral vesicle junction. Marked right-sided hydroureteronephrosis. Extravasation of urine surrounding the right kidney which appears diffusely edematous with delayed nephrogram. No worrisome renal or adrenal mass. Noncontrast filled imaging of the urinary bladder without mass identified. Stomach/Bowel: Diverticulosis most notable descending colon and sigmoid colon. No evidence of diverticulitis. Appendix not well delineated however no right lower lobe primary bowel inflammatory process noted. No obvious small bowel or gastric abnormality. Vascular/Lymphatic: Atherosclerotic changes aorta without abdominal aortic aneurysm. No large vessel occlusion. Scattered normal size lymph nodes increased in number upper abdominal/peripancreatic region of indeterminate etiology. Reproductive: Appearance of prior hysterectomy. No worrisome adnexal lesion. Other: No free air or bowel containing hernia. Small fat containing umbilical hernia. Musculoskeletal: Mild scoliosis with superimposed degenerative changes most notable T11-12. IMPRESSION: 1. 3 mm right ureteral vesicle obstructing stone. Additionally, 6 mm and 5 mm stone within the distal right ureter 6.2 cm proximal to the ureteral vesicle junction. Marked right-sided hydroureteronephrosis. Extravasation of urine surrounding the right kidney which appears diffusely edematous with delayed nephrogram. 2. Diverticulosis most notable descending colon and sigmoid colon. No evidence of diverticulitis. Appendix not well delineated however no right lower lobe primary bowel inflammatory process  noted. 3. Scattered normal size lymph nodes increased in number upper abdominal/peripancreatic region of indeterminate etiology. 4.  Aortic Atherosclerosis (ICD10-I70.0). 5. Slightly enlarged liver spanning over 18.8 cm. 6. Exam is motion degraded slightly  limiting evaluation particularly in the upper abdomen. These results were called by telephone at the time of interpretation on 12/07/2018 at 3:05 pm to Dr. Kennis Carina , who verbally acknowledged these results. Electronically Signed   By: Lacy Duverney M.D.   On: 12/07/2018 15:08   Dg C-arm 1-60 Min-no Report  Result Date: 12/07/2018 Fluoroscopy was utilized by the requesting physician.  No radiographic interpretation.        Scheduled Meds: Continuous Infusions: . sodium chloride 125 mL/hr at 12/08/18 1106     LOS: 1 day    Time spent: 25 minutes    Kathlen Mody, MD [facility] Pager [phone] If 7PM-7AM, please contact night-coverage [email or web address] Password [email or web address] 12/08/2018, 2:00 PM

## 2018-12-09 MED ORDER — AMOXICILLIN-POT CLAVULANATE 875-125 MG PO TABS: 1.0000 | ORAL_TABLET | Freq: Two times a day (BID) | ORAL | 0 refills | Status: AC

## 2018-12-10 NOTE — Discharge Summary (Signed)
Physician Discharge Summary  Monica Erickson:[id number] DOB: 1946-07-18 DOA: 12/07/2018  PCP: Patient, No Pcp Per  Admit date: 12/07/2018 Discharge date: 12/09/2018  Admitted From: Home Disposition:  Home  Recommendations for Outpatient Follow-up:  1. Follow up with PCP in 1-2 weeks 2. Please obtain BMP/CBC in one week 3. Please follow up with Urology.   Discharge Condition: stable.  CODE STATUS:full code.  Diet recommendation: Heart Healthy    Brief/Interim Summary: Monica Erickson a 73 y.o.femalewith medical history significant ofadvanced dementia not on any treatment, no significant medical problems presenting to the emergency room with 2 days of severe right flank pain. A CT scan of the abdomen and pelvis shows multiple right ureteric stone with severe hydronephrosis and inflammation on the right side. Case was discussed with urology. Patient was given a dose of Rocephin 2 g. Patient is started on IV fluids. She WAS transferred to [facility] long for cystoscopy and stent placement.  She is s/p right ureteral stent for obstructing stones she will need follow-up with urology for next 7 to 10 days for completion ureteroscopy and remove overall of all her stones.  Urology recommended broad-spectrum active biotics for 1 week.  Discharge Diagnoses:  Principal Problem:   Stone, kidney Active Problems:   Dementia without behavioral disturbance (HCC)  Severe hydronephrosis secondary to obstruction from ureteric stones on the right S/p right  ureteral stent by urology on 12/07/2018 Discharge on oral antibiotics.  Follow up with Urology as recommended in one patient.   Discharge Instructions  Discharge Instructions    Diet - low sodium heart healthy   Complete by:  As directed    Discharge instructions   Complete by:  As directed    Please follow up with Urology as recommended.     Allergies as of 12/09/2018   No Known Allergies     Medication List    TAKE these medications    amoxicillin-clavulanate 875-125 MG tablet Commonly known as:  AUGMENTIN Take 1 tablet by mouth every 12 (twelve) hours for 7 days.   phenazopyridine 200 MG tablet Commonly known as:  PYRIDIUM Take 1 tablet (200 mg total) by mouth 3 (three) times daily as needed for pain.   traMADol 50 MG tablet Commonly known as:  ULTRAM Take 1-2 tablets (50-100 mg total) by mouth every 6 (six) hours as needed for moderate pain.      Follow-up Information    Crist Fat, MD In 1 week.   Specialty:  Urology Why:  Will contact patient with f/u surgery date. Contact information: [address] [place] [address] [phone]          No Known Allergies  Consultations:  Urology.    Procedures/Studies: Dg Chest 2 View  Result Date: 12/07/2018 CLINICAL DATA:  Severe RIGHT upper quadrant pain. Shortness of breath. EXAM: CHEST - 2 VIEW COMPARISON:  None. FINDINGS: Cardiac silhouette is mildly enlarged. Mediastinal silhouette is normal. Mild bronchitic changes. No pleural effusion or focal consolidation. LEFT lower lung zone subpleural scarring. No pneumothorax. Soft tissue planes and included osseous structures are non suspicious. IMPRESSION: 1. Mild cardiomegaly. 2. Mild bronchitic changes without focal consolidation. Electronically Signed   By: Awilda Metro M.D.   On: 12/07/2018 13:46   Ct Abdomen Pelvis W Contrast  Result Date: 12/07/2018 CLINICAL DATA:  73 year old female with right flank and right lower quadrant pain. Initial encounter. EXAM: CT ABDOMEN AND PELVIS WITH CONTRAST TECHNIQUE: Multidetector CT imaging of the abdomen and pelvis was performed using  the standard protocol following bolus administration of intravenous contrast. CONTRAST:  100mL OMNIPAQUE IOHEXOL 300 MG/ML  SOLN COMPARISON:  None. FINDINGS: Lower chest: Basilar scarring/atelectasis. Heart size top-normal slightly enlarged. Slightly asymmetric breast parenchyma more notable on left. Hepatobiliary: Slightly  enlarged liver spanning over 18.8 cm. No worrisome hepatic lesion noted. No calcified gallstone. Motion degradation limits evaluation for detection of gallbladder inflammation. Pancreas: No worrisome pancreatic mass or primary pancreatic inflammation. Spleen: No splenic mass or enlargement. Splenule incidentally noted. Adrenals/Urinary Tract: 3 mm right ureteral vesicle obstructing stone. Additionally, 6 mm and 5 mm stone within the distal right ureter 6.2 cm proximal to the ureteral vesicle junction. Marked right-sided hydroureteronephrosis. Extravasation of urine surrounding the right kidney which appears diffusely edematous with delayed nephrogram. No worrisome renal or adrenal mass. Noncontrast filled imaging of the urinary bladder without mass identified. Stomach/Bowel: Diverticulosis most notable descending colon and sigmoid colon. No evidence of diverticulitis. Appendix not well delineated however no right lower lobe primary bowel inflammatory process noted. No obvious small bowel or gastric abnormality. Vascular/Lymphatic: Atherosclerotic changes aorta without abdominal aortic aneurysm. No large vessel occlusion. Scattered normal size lymph nodes increased in number upper abdominal/peripancreatic region of indeterminate etiology. Reproductive: Appearance of prior hysterectomy. No worrisome adnexal lesion. Other: No free air or bowel containing hernia. Small fat containing umbilical hernia. Musculoskeletal: Mild scoliosis with superimposed degenerative changes most notable T11-12. IMPRESSION: 1. 3 mm right ureteral vesicle obstructing stone. Additionally, 6 mm and 5 mm stone within the distal right ureter 6.2 cm proximal to the ureteral vesicle junction. Marked right-sided hydroureteronephrosis. Extravasation of urine surrounding the right kidney which appears diffusely edematous with delayed nephrogram. 2. Diverticulosis most notable descending colon and sigmoid colon. No evidence of diverticulitis. Appendix  not well delineated however no right lower lobe primary bowel inflammatory process noted. 3. Scattered normal size lymph nodes increased in number upper abdominal/peripancreatic region of indeterminate etiology. 4.  Aortic Atherosclerosis (ICD10-I70.0). 5. Slightly enlarged liver spanning over 18.8 cm. 6. Exam is motion degraded slightly limiting evaluation particularly in the upper abdomen. These results were called by telephone at the time of interpretation on 12/07/2018 at 3:05 pm to Dr. Kennis CarinaMICHAEL BERO , who verbally acknowledged these results. Electronically Signed   By: Lacy DuverneySteven  Olson M.D.   On: 12/07/2018 15:08   Dg C-arm 1-60 Min-no Report  Result Date: 12/07/2018 Fluoroscopy was utilized by the requesting physician.  No radiographic interpretation.     Subjective: No new complaints.   Discharge Exam: Vitals:   12/09/18 0643 12/09/18 1000  BP: (!) 155/87 (!) 149/85  Pulse: 71 (!) 52  Resp: 18 18  Temp: 98.4 F (36.9 C) 98.8 F (37.1 C)  SpO2: 100% 100%   Vitals:   12/08/18 1448 12/08/18 2105 12/09/18 0643 12/09/18 1000  BP: 131/70 (!) 161/92 (!) 155/87 (!) 149/85  Pulse: 76 72 71 (!) 52  Resp: (!) 22 18 18 18   Temp: 99 F (37.2 C) 99.3 F (37.4 C) 98.4 F (36.9 C) 98.8 F (37.1 C)  TempSrc: Oral Oral Oral Oral  SpO2: 99% 98% 100% 100%  Weight:      Height:        General: Pt is alert, awake, not in acute distress Cardiovascular: RRR, S1/S2 +, no rubs, no gallops Respiratory: CTA bilaterally, no wheezing, no rhonchi Abdominal: Soft, NT, ND, bowel sounds + Extremities: no edema, no cyanosis    The results of significant diagnostics from this hospitalization (including imaging, microbiology, ancillary and laboratory) are listed below for  reference.     Microbiology: No results found for this or any previous visit (from the past 240 hour(s)).   Labs: BNP (last 3 results) No results for input(s): BNP in the last 8760 hours. Basic Metabolic Panel: Recent Labs   Lab 12/07/18 1030  NA 139  K 3.7  CL 105  CO2 21*  GLUCOSE 133*  BUN 8  CREATININE 0.94  CALCIUM 9.5   Liver Function Tests: Recent Labs  Lab 12/07/18 1030  AST 20  ALT 10  ALKPHOS 86  BILITOT 0.8  PROT 7.7  ALBUMIN 4.2   Recent Labs  Lab 12/07/18 1030  LIPASE 37   No results for input(s): AMMONIA in the last 168 hours. CBC: Recent Labs  Lab 12/07/18 1030  WBC 5.3  HGB 13.1  HCT 41.0  MCV 91.3  PLT 209   Cardiac Enzymes: No results for input(s): CKTOTAL, CKMB, CKMBINDEX, TROPONINI in the last 168 hours. BNP: Invalid input(s): POCBNP CBG: Recent Labs  Lab 12/07/18 2050 12/08/18 0000  GLUCAP 111* 167*   D-Dimer No results for input(s): DDIMER in the last 72 hours. Hgb A1c No results for input(s): HGBA1C in the last 72 hours. Lipid Profile No results for input(s): CHOL, HDL, LDLCALC, TRIG, CHOLHDL, LDLDIRECT in the last 72 hours. Thyroid function studies No results for input(s): TSH, T4TOTAL, T3FREE, THYROIDAB in the last 72 hours.  Invalid input(s): FREET3 Anemia work up No results for input(s): VITAMINB12, FOLATE, FERRITIN, TIBC, IRON, RETICCTPCT in the last 72 hours. Urinalysis    Component Value Date/Time   COLORURINE YELLOW 12/07/2018 1306   APPEARANCEUR HAZY (A) 12/07/2018 1306   LABSPEC 1.014 12/07/2018 1306   PHURINE 8.0 12/07/2018 1306   GLUCOSEU NEGATIVE 12/07/2018 1306   HGBUR MODERATE (A) 12/07/2018 1306   BILIRUBINUR NEGATIVE 12/07/2018 1306   KETONESUR 5 (A) 12/07/2018 1306   PROTEINUR NEGATIVE 12/07/2018 1306   NITRITE NEGATIVE 12/07/2018 1306   LEUKOCYTESUR TRACE (A) 12/07/2018 1306   Sepsis Labs Invalid input(s): PROCALCITONIN,  WBC,  LACTICIDVEN Microbiology No results found for this or any previous visit (from the past 240 hour(s)).   Time coordinating discharge: 31 minutes  SIGNED:   Kathlen ModyVijaya Raeshawn Tafolla, MD  [facility] 12/10/2018, 8:02 AM Pager   If 7PM-7AM, please contact  night-coverage [email or web address] Password [email or web address]

## 2018-12-20 ENCOUNTER — Other Ambulatory Visit: Payer: Self-pay | Admitting: Urology

## 2018-12-27 ENCOUNTER — Other Ambulatory Visit: Payer: Self-pay

## 2018-12-27 ENCOUNTER — Encounter (HOSPITAL_BASED_OUTPATIENT_CLINIC_OR_DEPARTMENT_OTHER): Payer: Self-pay | Admitting: *Deleted

## 2018-12-27 NOTE — Progress Notes (Signed)
SPOKE WITH PATIENT DAUGHTER Monica Erickson DUE TO PATIENT WITH ALZHEIMERS NPO AFTER MIDNIGHT, ARRIVE 12-31-2018 530AM [facility]  TRAMADOL PRN, PYRIDIUM PRN DAUGHTER Monica STEVENSN OR DAUGHTER CRYSTAL Erickson DRIVER AND SIGNS CONSENTS DUE TO PATIENT WITH ALZHEIMERS, NO FORMAL POA PER DAUGHTER Monica Erickson RECORDS ON CHART/EPIC: EKG 12-07-18, CHEST XRAY 12-07-18 HAS SURGERY ORDERS IN Epic NEEDS BMET

## 2018-12-30 NOTE — Anesthesia Preprocedure Evaluation (Addendum)
Anesthesia Evaluation  Patient identified by MRN, date of birth, ID band Patient awake and Patient confused    Reviewed: Allergy & Precautions, NPO status , Patient's Chart, lab work & pertinent test results  Airway Mallampati: II  TM Distance: >3 FB Neck ROM: Full    Dental  (+) Upper Dentures, Lower Dentures   Pulmonary neg pulmonary ROS,    Pulmonary exam normal breath sounds clear to auscultation       Cardiovascular negative cardio ROS Normal cardiovascular exam Rhythm:Regular Rate:Normal     Neuro/Psych PSYCHIATRIC DISORDERS Dementia ALZHEIMERS negative neurological ROS     GI/Hepatic negative GI ROS, Neg liver ROS,   Endo/Other  negative endocrine ROS  Renal/GU negative Renal ROS     Musculoskeletal negative musculoskeletal ROS (+)   Abdominal   Peds  Hematology negative hematology ROS (+)   Anesthesia Other Findings RIGHT URETERAL STONES    Reproductive/Obstetrics                            Anesthesia Physical Anesthesia Plan  ASA: II  Anesthesia Plan: General   Post-op Pain Management:    Induction: Intravenous  PONV Risk Score and Plan: 4 or greater and Dexamethasone, Ondansetron and Treatment may vary due to age or medical condition  Airway Management Planned: LMA  Additional Equipment:   Intra-op Plan:   Post-operative Plan: Extubation in OR  Informed Consent: I have reviewed the patients History and Physical, chart, labs and discussed the procedure including the risks, benefits and alternatives for the proposed anesthesia with the patient or authorized representative who has indicated his/her understanding and acceptance.       Plan Discussed with: CRNA  Anesthesia Plan Comments:        Anesthesia Quick Evaluation

## 2018-12-31 ENCOUNTER — Ambulatory Visit (HOSPITAL_BASED_OUTPATIENT_CLINIC_OR_DEPARTMENT_OTHER): Payer: Medicare Other | Admitting: Anesthesiology

## 2018-12-31 ENCOUNTER — Ambulatory Visit (HOSPITAL_BASED_OUTPATIENT_CLINIC_OR_DEPARTMENT_OTHER)
Admission: RE | Admit: 2018-12-31 | Discharge: 2018-12-31 | Disposition: A | Discharge status: Home / Self Care | Payer: Medicare Other | Attending: Urology | Admitting: Urology

## 2018-12-31 ENCOUNTER — Encounter (HOSPITAL_BASED_OUTPATIENT_CLINIC_OR_DEPARTMENT_OTHER)
Admission: RE | Disposition: A | Discharge status: Home / Self Care | Payer: Self-pay | Source: Home / Self Care | Attending: Urology

## 2018-12-31 ENCOUNTER — Encounter (HOSPITAL_BASED_OUTPATIENT_CLINIC_OR_DEPARTMENT_OTHER): Payer: Self-pay

## 2018-12-31 DIAGNOSIS — F0280 Dementia in other diseases classified elsewhere without behavioral disturbance: Secondary | ICD-10-CM | POA: Diagnosis not present

## 2018-12-31 DIAGNOSIS — N132 Hydronephrosis with renal and ureteral calculous obstruction: Secondary | ICD-10-CM | POA: Diagnosis present

## 2018-12-31 DIAGNOSIS — E119 Type 2 diabetes mellitus without complications: Secondary | ICD-10-CM | POA: Diagnosis not present

## 2018-12-31 DIAGNOSIS — G309 Alzheimer's disease, unspecified: Secondary | ICD-10-CM | POA: Diagnosis not present

## 2018-12-31 DIAGNOSIS — I878 Other specified disorders of veins: Secondary | ICD-10-CM | POA: Diagnosis not present

## 2018-12-31 DIAGNOSIS — N200 Calculus of kidney: Secondary | ICD-10-CM

## 2018-12-31 HISTORY — DX: Unspecified dementia, unspecified severity, without behavioral disturbance, psychotic disturbance, mood disturbance, and anxiety: F03.90

## 2018-12-31 HISTORY — PX: CYSTOSCOPY/URETEROSCOPY/HOLMIUM LASER/STENT PLACEMENT: SHX6546

## 2018-12-31 LAB — BASIC METABOLIC PANEL
ANION GAP: 9 (ref 5–15)
BUN: 12 mg/dL (ref 8–23)
CO2: 24 mmol/L (ref 22–32)
Calcium: 9.3 mg/dL (ref 8.9–10.3)
Chloride: 105 mmol/L (ref 98–111)
Creatinine, Ser: 0.85 mg/dL (ref 0.44–1.00)
GFR calc Af Amer: >60 mL/min (ref >60)
GFR calc non Af Amer: >60 mL/min (ref >60)
Glucose, Bld: 100 mg/dL — ABNORMAL HIGH (ref 70–99)
POTASSIUM: 3.7 mmol/L (ref 3.5–5.1)
Sodium: 138 mmol/L (ref 135–145)

## 2018-12-31 SURGERY — CYSTOSCOPY/URETEROSCOPY/HOLMIUM LASER/STENT PLACEMENT
Anesthesia: General | Site: Ureter | Laterality: Right

## 2018-12-31 MED ORDER — KETOROLAC TROMETHAMINE 30 MG/ML IJ SOLN
INTRAMUSCULAR | Status: AC
  Filled 2018-12-31: qty 1

## 2018-12-31 MED ORDER — FENTANYL CITRATE (PF) 100 MCG/2ML IJ SOLN
25.0000 ug | INTRAMUSCULAR | Status: DC | PRN
  Filled 2018-12-31: qty 1

## 2018-12-31 MED ORDER — ACETAMINOPHEN 500 MG PO TABS
ORAL_TABLET | ORAL | Status: AC
  Filled 2018-12-31: qty 2

## 2018-12-31 MED ORDER — DEXAMETHASONE SODIUM PHOSPHATE 4 MG/ML IJ SOLN
INTRAMUSCULAR | Status: DC | PRN
  Administered 2018-12-31: 5 mg via INTRAVENOUS

## 2018-12-31 MED ORDER — ONDANSETRON HCL 4 MG/2ML IJ SOLN
INTRAMUSCULAR | Status: DC | PRN
  Administered 2018-12-31: 4 mg via INTRAVENOUS

## 2018-12-31 MED ORDER — LACTATED RINGERS IV SOLN
INTRAVENOUS | Status: DC
  Administered 2018-12-31 (×2): via INTRAVENOUS
  Filled 2018-12-31: qty 1000

## 2018-12-31 MED ORDER — FENTANYL CITRATE (PF) 100 MCG/2ML IJ SOLN
INTRAMUSCULAR | Status: DC | PRN
  Administered 2018-12-31 (×4): 25 ug via INTRAVENOUS

## 2018-12-31 MED ORDER — ONDANSETRON HCL 4 MG/2ML IJ SOLN
INTRAMUSCULAR | Status: AC
  Filled 2018-12-31: qty 2

## 2018-12-31 MED ORDER — BELLADONNA ALKALOIDS-OPIUM 16.2-60 MG RE SUPP
RECTAL | Status: AC
  Filled 2018-12-31: qty 1

## 2018-12-31 MED ORDER — TRAMADOL HCL 50 MG PO TABS: 50.0000 mg | ORAL_TABLET | Freq: Four times a day (QID) | ORAL | 0 refills | Status: DC | PRN

## 2018-12-31 MED ORDER — PHENAZOPYRIDINE HCL 200 MG PO TABS: 200.0000 mg | ORAL_TABLET | Freq: Three times a day (TID) | ORAL | 0 refills | Status: DC | PRN

## 2018-12-31 MED ORDER — DEXAMETHASONE SODIUM PHOSPHATE 10 MG/ML IJ SOLN
INTRAMUSCULAR | Status: AC
  Filled 2018-12-31: qty 1

## 2018-12-31 MED ORDER — ACETAMINOPHEN 500 MG PO TABS
1000.0000 mg | ORAL_TABLET | Freq: Once | ORAL | Status: AC
  Administered 2018-12-31: 1000 mg via ORAL
  Filled 2018-12-31: qty 2

## 2018-12-31 MED ORDER — ONDANSETRON HCL 4 MG/2ML IJ SOLN
4.0000 mg | Freq: Once | INTRAMUSCULAR | Status: DC | PRN
  Filled 2018-12-31: qty 2

## 2018-12-31 MED ORDER — KETOROLAC TROMETHAMINE 15 MG/ML IJ SOLN
15.0000 mg | Freq: Once | INTRAMUSCULAR | Status: DC | PRN
  Filled 2018-12-31: qty 1

## 2018-12-31 MED ORDER — IOHEXOL 300 MG/ML  SOLN
INTRAMUSCULAR | Status: DC | PRN
  Administered 2018-12-31: 20 mL

## 2018-12-31 MED ORDER — SODIUM CHLORIDE 0.9 % IR SOLN
Status: DC | PRN
  Administered 2018-12-31: 1 via INTRAVESICAL

## 2018-12-31 MED ORDER — LIDOCAINE 2% (20 MG/ML) 5 ML SYRINGE
INTRAMUSCULAR | Status: AC
  Filled 2018-12-31: qty 5

## 2018-12-31 MED ORDER — GENTAMICIN SULFATE 40 MG/ML IJ SOLN
440.0000 mg | INTRAVENOUS | Status: AC
  Administered 2018-12-31: 444 mg via INTRAVENOUS
  Filled 2018-12-31 (×2): qty 11

## 2018-12-31 MED ORDER — KETOROLAC TROMETHAMINE 30 MG/ML IJ SOLN
INTRAMUSCULAR | Status: DC | PRN
  Administered 2018-12-31: 30 mg via INTRAVENOUS

## 2018-12-31 MED ORDER — PROPOFOL 10 MG/ML IV BOLUS
INTRAVENOUS | Status: AC
  Filled 2018-12-31: qty 40

## 2018-12-31 MED ORDER — BELLADONNA ALKALOIDS-OPIUM 16.2-60 MG RE SUPP
RECTAL | Status: DC | PRN
  Administered 2018-12-31: 1 via RECTAL

## 2018-12-31 MED ORDER — FENTANYL CITRATE (PF) 100 MCG/2ML IJ SOLN
INTRAMUSCULAR | Status: AC
  Filled 2018-12-31: qty 2

## 2018-12-31 MED ORDER — PROPOFOL 10 MG/ML IV BOLUS
INTRAVENOUS | Status: DC | PRN
  Administered 2018-12-31: 100 mg via INTRAVENOUS
  Administered 2018-12-31: 50 mg via INTRAVENOUS

## 2018-12-31 MED ORDER — LIDOCAINE HCL (CARDIAC) PF 100 MG/5ML IV SOSY
PREFILLED_SYRINGE | INTRAVENOUS | Status: DC | PRN
  Administered 2018-12-31: 40 mg via INTRAVENOUS

## 2018-12-31 MED ORDER — GENTAMICIN SULFATE 40 MG/ML IJ SOLN
240.0000 mg | INTRAVENOUS | Status: DC
  Filled 2018-12-31: qty 6

## 2018-12-31 SURGICAL SUPPLY — 23 items
BAG DRAIN URO-CYSTO SKYTR STRL (DRAIN) ×3 IMPLANT
BASKET LASER NITINOL 1.9FR (BASKET) IMPLANT
BASKET STONE 1.7 NGAGE (UROLOGICAL SUPPLIES) ×3 IMPLANT
CATH URET 5FR 28IN OPEN ENDED (CATHETERS) ×3 IMPLANT
CATH URET DUAL LUMEN 6-10FR 50 (CATHETERS) IMPLANT
CLOTH BEACON ORANGE TIMEOUT ST (SAFETY) ×3 IMPLANT
EXTRACTOR STONE 1.7FRX115CM (UROLOGICAL SUPPLIES) IMPLANT
FIBER LASER TRAC TIP (UROLOGICAL SUPPLIES) ×3 IMPLANT
GLOVE BIO SURGEON STRL SZ7.5 (GLOVE) ×3 IMPLANT
GOWN STRL REUS W/TWL XL LVL3 (GOWN DISPOSABLE) ×3 IMPLANT
GUIDEWIRE ANG ZIPWIRE 038X150 (WIRE) IMPLANT
GUIDEWIRE STR DUAL SENSOR (WIRE) ×3 IMPLANT
INFUSOR MANOMETER BAG 3000ML (MISCELLANEOUS) IMPLANT
IV NS IRRIG 3000ML ARTHROMATIC (IV SOLUTION) ×6 IMPLANT
KIT TURNOVER CYSTO (KITS) ×3 IMPLANT
MANIFOLD NEPTUNE II (INSTRUMENTS) ×3 IMPLANT
NS IRRIG 500ML POUR BTL (IV SOLUTION) ×3 IMPLANT
PACK CYSTO (CUSTOM PROCEDURE TRAY) ×3 IMPLANT
SHEATH URET ACCESS 12FR/35CM (UROLOGICAL SUPPLIES) ×3 IMPLANT
STENT URET 6FRX26 CONTOUR (STENTS) ×3 IMPLANT
TUBE CONNECTING 12'X1/4 (SUCTIONS) ×1
TUBE CONNECTING 12X1/4 (SUCTIONS) ×2 IMPLANT
TUBING UROLOGY SET (TUBING) ×3 IMPLANT

## 2018-12-31 NOTE — Anesthesia Postprocedure Evaluation (Signed)
Anesthesia Post Note  Patient: Monica Erickson  Procedure(s) Performed: CYSTOSCOPY RIGHT URETEROSCOPY/HOLMIUM LASER RIGHT STENT EXGHANGE (Right Ureter)     Patient location during evaluation: PACU Anesthesia Type: General Level of consciousness: awake and alert Pain management: pain level controlled Vital Signs Assessment: post-procedure vital signs reviewed and stable Respiratory status: spontaneous breathing, nonlabored ventilation, respiratory function stable and patient connected to nasal cannula oxygen Cardiovascular status: blood pressure returned to baseline and stable Postop Assessment: no apparent nausea or vomiting Anesthetic complications: no    Last Vitals:  Vitals:   12/31/18 0930 12/31/18 1020  BP: 120/65 137/66  Pulse: (!) 50 68  Resp: 11 12  Temp:  36.8 C  SpO2: 100% 100%    Last Pain:  Vitals:   12/31/18 1020  TempSrc: Oral  PainSc: 0-No pain                 Ryan P Ellender

## 2018-12-31 NOTE — Interval H&P Note (Signed)
History and Physical Interval Note:  12/31/2018 7:37 AM  Monica Erickson  has presented today for surgery, with the diagnosis of RIGHT URETERAL STONES  The various methods of treatment have been discussed with the patient and family. After consideration of risks, benefits and other options for treatment, the patient has consented to  Procedure(s): CYSTOSCOPY RIGHT URETEROSCOPY/HOLMIUM LASER RIGHT STENT EXGHANGE (Right) as a surgical intervention .  The patient's history has been reviewed, patient examined, no change in status, stable for surgery.  I have reviewed the patient's chart and labs.  Questions were answered to the patient's satisfaction.     Crist Fat

## 2018-12-31 NOTE — Op Note (Signed)
Preoperative diagnosis:  1. Right obstructing ureteral stones  Postoperative diagnosis:  1. Passed right distal ureteral stone, iliac vein phlebolith  Procedure: 1. Cystoscopy, right retrograde pyelogram with interpretation 2. Right ureteroscopy, diagnostic 3. Right ureteral stent exchange  Surgeon: Crist Fat, MD  Anesthesia: General  Complications: None  Intraoperative findings: The right retrograde pyelogram was performed with a 5 [place] open-ended ureteral catheter and was noted for no filling defects within the right ureter and no significant hydroureteronephrosis.  There was some mild filling defects within the renal pelvis.  Ureteroscopy demonstrated no stones within the right ureter.  Flexible ureteroscopy demonstrated old resolving clot within the right renal pelvis but no significant or hard fragments.  EBL: Minimal  Specimens: None  Indication: Monica Erickson is a 73 y.o. patient presented to the emergency department with several weeks of right sided flank pain.  She had a urine analysis that was concerning for infection.  A CT scan demonstrated a right UVJ stone with calcification in the pelvis on the right side that was also called as a obstructing ureteral stone.  She was subsequently taken to the operating room urgently and a right ureteral stent was placed.  She follows up today for completion ureteroscopy and stone removal..  After reviewing the management options for treatment, he elected to proceed with the above surgical procedure(s). We have discussed the potential benefits and risks of the procedure, side effects of the proposed treatment, the likelihood of the patient achieving the goals of the procedure, and any potential problems that might occur during the procedure or recuperation. Informed consent has been obtained.  Description of procedure:  The patient was taken to the operating room and general anesthesia was induced.  The patient was placed in the  dorsal lithotomy position, prepped and draped in the usual sterile fashion, and preoperative antibiotics were administered. A preoperative time-out was performed.   21 French 30 degrees cystoscope was gently passed to the patient's urethra and the bladder under visual guidance.  The stent emanating from the patient's right ureteral orifice was grasped with a stent grasper and brought to the urethral meatus under visual guidance.  Stent was then wired with a 0.038 sensor wire and advanced up to the renal pelvis removing the stent over the wire.  The wire was then exchanged for a 5 [place] mini ureteral catheter using 10 cc of Omnipaque contrast a retrograde pyelogram was performed with the above findings.  The wire was then replaced and the 5 [place] open-ended catheter removed.  A 4/6 French semirigid ureteroscope was then advanced to the patient's urethra and into the right ureter under visual guidance.  This was advanced all the way up to the renal pelvis encountering no significant stones.  I then advanced a second wire through the rigid ureteroscope and into the renal pelvis.  I backed the scope out over the wire again, encountering no stones.  I then passed this 12/14 [place] ureteral access sheath under fluoroscopic guidance up to the proximal ureter removing the inner portion of the sheath as well as a wire.  Using the flexible ureteroscope I advanced this through the sheath and into the renal pelvis.  Pyeloscopy was then performed noting significant clot fragments within the renal pelvis and midpole calyces.  There was several clots that appeared as stones, and using a laser I fragmented these clots that were easily broken up and quite soft.  I then tried to remove these fragments with the basket unsuccessfully.  I subsequently  aspirated these with a syringe and they were all aspirated and the renal pelvis cleared.  I then instilled 10 cc of Omnipaque contrast through the scope and with fluoroscopic guidance  navigated the renal pelvis noting no significant additional stone fragments or clot.  I then slowly backed out the scope removing the access sheath simultaneously noting no significant ureteral trauma.  I then backloaded the wire through the cystoscope and using the Seldinger technique advanced the stent over the wire and into the right renal pelvis under fluoroscopic guidance.  Stent was noted to curl nicely in the right upper pole prior to removing the wire.  The bladder was subsequently emptied.  A B&O suppositories placed in the patient's rectum.  She was subsequently extubated return the PACU in stable condition.  Disposition: The stent tether was cut, and no string was left.  She will follow-up with me in clinic in 10 days to have the stent removed.  Crist Fat, M.D.

## 2018-12-31 NOTE — H&P (Signed)
History of present illness: 73 year old female who presented to the emergency department with ongoing right-sided pain.  The pain is been ongoing for 2 to 3 days.  She was having associated nausea and vomiting.  The pain was 10/10 associated with the diarrhea.  She was not having any fevers.  She does have a history of dementia, and her daughters taken care of her.  In the emergency department a CT scan was obtained demonstrating a 3 mm stone at the right UVJ as well as a 5 and a 6 mm stone in the mid ureter on the right with severe hydroureteronephrosis and what appears like a forniceal rupture.  The patient urine analysis was consistent with contamination, did not appear to be infected.  The patient's white blood cell count was within normal limits and there was no evidence of fever.  At the time of my interview of the patient she was unable to answer most questions because of the medication she had been given in association with her baseline dementia.  Her daughters were not present.  Review of systems: A 12 point comprehensive review of systems was obtained and is negative unless otherwise stated in the history of present illness.      Patient Active Problem List   Diagnosis Date Noted  . Stone, kidney 12/07/2018  . Dementia without behavioral disturbance (HCC) 12/07/2018    No current facility-administered medications on file prior to encounter.    No current outpatient medications on file prior to encounter.        Past Medical History:  Diagnosis Date  . Diabetes mellitus without complication (HCC)     History reviewed. No pertinent surgical history.  Social History        Tobacco Use  . Smoking status: Never Smoker  Substance Use Topics  . Alcohol use: Never    Frequency: Never  . Drug use: Never    History reviewed. No pertinent family history.  PE:       Vitals:   12/07/18 1315 12/07/18 1400 12/07/18 1415 12/07/18 1430  BP:  140/83 127/70  (!) 146/75  Pulse: 90 79 77 80  Resp: 20 18 19 19   Temp:      TempSrc:      SpO2: 100% 100% 94% 100%  Weight:      Height:       Patient appears to be in no acute distress  patient is alert and oriented x3 Atraumatic normocephalic head No cervical or supraclavicular lymphadenopathy appreciated No increased work of breathing, no audible wheezes/rhonchi Regular sinus rhythm/rate Abdomen is soft, nondistended, severe right CVA tenderness and right lower abdominal pain  Lower extremities are symmetric without appreciable edema Grossly neurologically intact No identifiable skin lesions  RecentLabs(last2labs)     Recent Labs    12/07/18 1030  WBC 5.3  HGB 13.1  HCT 41.0     RecentLabs(last2labs)  Recent Labs    12/07/18 1030  NA 139  K 3.7  CL 105  CO2 21*  GLUCOSE 133*  BUN 8  CREATININE 0.94  CALCIUM 9.5     RecentLabs(last2labs)  No results for input(s): LABPT, INR in the last 72 hours.   RecentLabs(last2labs)  No results for input(s): LABURIN in the last 72 hours.   No results found for this or any previous visit.  Imaging: CT scan: I have independently reviewed the scan which demonstrates a 3 mm stone at the right UVJ as well as a 5 and a 6 mm stone in  the mid ureter on the right with severe hydroureteronephrosis and what appears like a forniceal rupture.    Imp: Several obstructing right mid ureteral stones and lower UVJ stone with associated forniceal rupture and severe hydroureteronephrosis.  Based on the patient's clinical information I do not think she has any evidence of infection.  She certainly is in quite a bit of pain.  Stone burden is enough that she is unlikely to pass the stones in the near future.  Recommendations: Plan is to transfer the patient to [facility] long then take the patient urgently to the operating room for cystoscopy and right ureteral stent placement.  Once the patient has recovered from the  forniceal rupture and possible infection we will plan to remove her stone in the next 7 to 10 days.

## 2018-12-31 NOTE — Discharge Instructions (Signed)
DISCHARGE INSTRUCTIONS FOR KIDNEY STONE/URETERAL STENT   MEDICATIONS:  1.  Resume all your other meds from home - except do not take any extra narcotic pain meds that you may have at home.  2. Pyridium is to help with the burning/stinging when you urinate. 3. Tramadol is for moderate/severe pain, otherwise taking upto 1000 mg every 6 hours of plainTylenol will help treat your pain.   ACTIVITY:  1. No strenuous activity x 1week  2. No driving while on narcotic pain medications  3. Drink plenty of water  4. Continue to walk at home - you can still get blood clots when you are at home, so keep active, but don't over do it.  5. May return to work/school tomorrow or when you feel ready   BATHING:  1. You can shower and we recommend daily showers   SIGNS/SYMPTOMS TO CALL:  Please call us if you have a fever greater than 101.5, uncontrolled nausea/vomiting, uncontrolled pain, dizziness, unable to urinate, bloody urine, chest pain, shortness of breath, leg swelling, leg pain, redness around wound, drainage from wound, or any other concerns or questions.   You can reach [place] at [phone].   FOLLOW-UP:  1. You have an appointment for stent removal in 10 days.      Post Anesthesia Home Care Instructions  Activity: Get plenty of rest for the remainder of the day. A responsible individual must stay with you for 24 hours following the procedure.  For the next 24 hours, DO NOT: -Drive a car -Advertising copywriter -Drink alcoholic beverages -Take any medication unless instructed by your physician -Make any legal decisions or sign important papers.  Meals: Start with liquid foods such as gelatin or soup. Progress to regular foods as tolerated. Avoid greasy, spicy, heavy foods. If nausea and/or vomiting occur, drink only clear liquids until the nausea and/or vomiting subsides. Call your physician if vomiting continues.  Special Instructions/Symptoms: Your throat may feel dry or sore from the  anesthesia or the breathing tube placed in your throat during surgery. If this causes discomfort, gargle with warm salt water. The discomfort should disappear within 24 hours.  If you had a scopolamine patch placed behind your ear for the management of post- operative nausea and/or vomiting:  1. The medication in the patch is effective for 72 hours, after which it should be removed.  Wrap patch in a tissue and discard in the trash. Wash hands thoroughly with soap and water. 2. You may remove the patch earlier than 72 hours if you experience unpleasant side effects which may include dry mouth, dizziness or visual disturbances. 3. Avoid touching the patch. Wash your hands with soap and water after contact with the patch.

## 2018-12-31 NOTE — Transfer of Care (Signed)
Immediate Anesthesia Transfer of Care Note  Patient: Monica Erickson  Procedure(s) Performed: Procedure(s) (LRB): CYSTOSCOPY RIGHT URETEROSCOPY/HOLMIUM LASER RIGHT STENT EXGHANGE (Right)  Patient Location: PACU  Anesthesia Type: General  Level of Consciousness: awake, sedated, patient cooperative and responds to stimulation  Airway & Oxygen Therapy: Patient Spontanous Breathing and Patient connected to [place] oxygen  Post-op Assessment: Report given to PACU RN, Post -op Vital signs reviewed and stable and Patient moving all extremities  Post vital signs: Reviewed and stable  Complications: No apparent anesthesia complications

## 2018-12-31 NOTE — Anesthesia Procedure Notes (Signed)
Procedure Name: LMA Insertion Date/Time: 12/31/2018 7:49 AM Performed by: Jessica Priest, CRNA Pre-anesthesia Checklist: Patient identified, Emergency Drugs available, Suction available and Patient being monitored Patient Re-evaluated:Patient Re-evaluated prior to induction Oxygen Delivery Method: Circle system utilized Preoxygenation: Pre-oxygenation with 100% oxygen Induction Type: IV induction Ventilation: Mask ventilation without difficulty LMA: LMA inserted LMA Size: 4.0 Number of attempts: 1 Airway Equipment and Method: Bite block Placement Confirmation: positive ETCO2 and breath sounds checked- equal and bilateral Tube secured with: Tape Dental Injury: Teeth and Oropharynx as per pre-operative assessment

## 2019-01-03 ENCOUNTER — Encounter (HOSPITAL_BASED_OUTPATIENT_CLINIC_OR_DEPARTMENT_OTHER): Payer: Self-pay | Admitting: Urology

## 2019-01-31 ENCOUNTER — Emergency Department (HOSPITAL_COMMUNITY): Payer: Medicare Other

## 2019-01-31 ENCOUNTER — Other Ambulatory Visit: Payer: Self-pay

## 2019-01-31 ENCOUNTER — Emergency Department (HOSPITAL_COMMUNITY)
Admission: EM | Admit: 2019-01-31 | Discharge: 2019-02-01 | Disposition: A | Discharge status: Home / Self Care | Payer: Medicare Other | Attending: Emergency Medicine | Admitting: Emergency Medicine

## 2019-01-31 ENCOUNTER — Encounter (HOSPITAL_COMMUNITY): Payer: Self-pay

## 2019-01-31 DIAGNOSIS — R1013 Epigastric pain: Secondary | ICD-10-CM | POA: Insufficient documentation

## 2019-01-31 DIAGNOSIS — F0390 Unspecified dementia without behavioral disturbance: Secondary | ICD-10-CM | POA: Diagnosis not present

## 2019-01-31 DIAGNOSIS — Z79899 Other long term (current) drug therapy: Secondary | ICD-10-CM | POA: Diagnosis not present

## 2019-01-31 LAB — BASIC METABOLIC PANEL
Anion gap: 10 (ref 5–15)
BUN: 7 mg/dL — ABNORMAL LOW (ref 8–23)
CO2: 22 mmol/L (ref 22–32)
Calcium: 9.1 mg/dL (ref 8.9–10.3)
Chloride: 107 mmol/L (ref 98–111)
Creatinine, Ser: 0.96 mg/dL (ref 0.44–1.00)
GFR calc Af Amer: >60 mL/min (ref >60)
GFR calc non Af Amer: 60 mL/min — ABNORMAL LOW (ref >60)
Glucose, Bld: 113 mg/dL — ABNORMAL HIGH (ref 70–99)
Potassium: 3.7 mmol/L (ref 3.5–5.1)
Sodium: 139 mmol/L (ref 135–145)

## 2019-01-31 LAB — CBC
HCT: 36.2 % (ref 36.0–46.0)
Hemoglobin: 11.4 g/dL — ABNORMAL LOW (ref 12.0–15.0)
MCH: 28.3 pg (ref 26.0–34.0)
MCHC: 31.5 g/dL (ref 30.0–36.0)
MCV: 89.8 fL (ref 80.0–100.0)
Platelets: 218 10*3/uL (ref 150–400)
RBC: 4.03 MIL/uL (ref 3.87–5.11)
RDW: 14.5 % (ref 11.5–15.5)
WBC: 4.9 10*3/uL (ref 4.0–10.5)
nRBC: 0 % (ref 0.0–0.2)

## 2019-01-31 LAB — I-STAT TROPONIN, ED: Troponin i, poc: 0 ng/mL (ref 0.00–0.08)

## 2019-01-31 LAB — HEPATIC FUNCTION PANEL
ALT: 8 U/L (ref 0–44)
AST: 16 U/L (ref 15–41)
Albumin: 3.7 g/dL (ref 3.5–5.0)
Alkaline Phosphatase: 79 U/L (ref 38–126)
Bilirubin, Direct: <0.1 mg/dL (ref 0.0–0.2)
Total Bilirubin: 0.5 mg/dL (ref 0.3–1.2)
Total Protein: 7.4 g/dL (ref 6.5–8.1)

## 2019-01-31 LAB — LIPASE, BLOOD: Lipase: 37 U/L (ref 11–51)

## 2019-01-31 MED ORDER — SODIUM CHLORIDE 0.9 % IV SOLN
INTRAVENOUS | Status: DC
  Administered 2019-01-31: 22:00:00 via INTRAVENOUS

## 2019-01-31 MED ORDER — MORPHINE SULFATE (PF) 4 MG/ML IV SOLN
4.0000 mg | Freq: Once | INTRAVENOUS | Status: DC
  Filled 2019-01-31: qty 1

## 2019-01-31 MED ORDER — IOHEXOL 300 MG/ML  SOLN
100.0000 mL | Freq: Once | INTRAMUSCULAR | Status: AC | PRN
  Administered 2019-01-31: 100 mL via INTRAVENOUS

## 2019-01-31 MED ORDER — FAMOTIDINE 20 MG IN NS 100 ML IVPB
20.0000 mg | Freq: Once | INTRAVENOUS | Status: AC
  Administered 2019-01-31: 20 mg via INTRAVENOUS
  Filled 2019-01-31: qty 100

## 2019-01-31 NOTE — ED Notes (Signed)
Attempted IV x2, unsuccessful.  

## 2019-01-31 NOTE — ED Triage Notes (Signed)
Patient c/o rib pain, right side. Denies cough, and injury. States it hurts to take a deep breath

## 2019-01-31 NOTE — ED Notes (Addendum)
Daughter ([name]) [phone].  Daughter stated pt has dementia and becomes very confused.

## 2019-01-31 NOTE — ED Provider Notes (Signed)
[facility] EMERGENCY DEPARTMENT Provider Note   CSN: [id number] Arrival date & time: 01/31/19  1912    History   Chief Complaint Chief Complaint  Patient presents with   Chest Pain    (right lower rib)    HPI Monica Erickson is a 73 y.o. female.  HPI   73 year old female with epigastric pain.  Onset this afternoon.  Describes sharp.  Does not radiate.  Sometimes worse with deep breathing.  No cough.  No fevers or chills.  No unusual leg pain or swelling.  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   Dementia without behavioral disturbance (HCC) 12/07/2018    Past Surgical History:  Procedure Laterality Date   CYSTOSCOPY WITH STENT PLACEMENT Right 12/07/2018   Procedure: CYSTOSCOPY WITH STENT PLACEMENT;  Surgeon: Crist Fat, MD;  Location: [facility];  Service: Urology;  Laterality: Right;   CYSTOSCOPY/URETEROSCOPY/HOLMIUM LASER/STENT PLACEMENT Right 12/31/2018   Procedure: CYSTOSCOPY RIGHT URETEROSCOPY/HOLMIUM LASER RIGHT Lucretia Roers;  Surgeon: Crist Fat, MD;  Location: [facility];  Service: Urology;  Laterality: Right;   JOINT REPLACEMENT  5 YRS AGO   KNEE REPLACEMENT     OB History   No obstetric history on file.      Home Medications    Prior to Admission medications   Medication Sig Start Date End Date Taking? Authorizing Provider  amoxicillin-clavulanate (AUGMENTIN) 875-125 MG tablet Take 1 tablet by mouth 2 (two) times daily.    [provider]  phenazopyridine (PYRIDIUM) 200 MG tablet Take 1 tablet (200 mg total) by mouth 3 (three) times daily as needed for pain. 12/31/18   Crist Fat, MD  traMADol (ULTRAM) 50 MG tablet Take 1-2 tablets (50-100 mg total) by mouth every 6 (six) hours as needed for moderate pain. 12/31/18   Crist Fat, MD    Family  History No family history on file.  Social History Social History   Tobacco Use   Smoking status: Never Smoker   Smokeless tobacco: Never Used  Substance Use Topics   Alcohol use: Never    Frequency: Never   Drug use: Never     Allergies   Patient has no known allergies.   Review of Systems Review of Systems All systems reviewed and negative, other than as noted in HPI.   Physical Exam Updated Vital Signs BP 135/88    Pulse 73    Temp 98.1 F (36.7 C) (Oral)    Resp 18    Ht 5\' 11"  (1.803 m)    Wt 90.7 kg    SpO2 100%    BMI 27.89 kg/m   Physical Exam Vitals signs and nursing note reviewed.  Constitutional:      General: She is not in acute distress.    Appearance: She is well-developed.  HENT:     Head: Normocephalic and atraumatic.  Eyes:     General:        Right eye: No discharge.        Left eye: No discharge.     Conjunctiva/sclera: Conjunctivae normal.  Neck:     Musculoskeletal: Neck supple.  Cardiovascular:     Rate and Rhythm: Normal rate and regular rhythm.     Heart sounds: Normal heart sounds. No murmur. No friction rub. No gallop.   Pulmonary:  Effort: Pulmonary effort is normal. No respiratory distress.     Breath sounds: Normal breath sounds.  Abdominal:     General: There is no distension.     Palpations: Abdomen is soft.     Tenderness: There is abdominal tenderness.     Comments: Epigastric tenderness without rebound or guarding.  No distention.  Musculoskeletal:        General: No tenderness.  Skin:    General: Skin is warm and dry.  Neurological:     Mental Status: She is alert.  Psychiatric:        Behavior: Behavior normal.        Thought Content: Thought content normal.      ED Treatments / Results  Labs (all labs ordered are listed, but only abnormal results are displayed) Labs Reviewed  BASIC METABOLIC PANEL - Abnormal; Notable for the following components:      Result Value   Glucose, Bld 113 (*)    BUN 7  (*)    GFR calc non Af Amer 60 (*)    All other components within normal limits  CBC - Abnormal; Notable for the following components:   Hemoglobin 11.4 (*)    All other components within normal limits  LIPASE, BLOOD  HEPATIC FUNCTION PANEL  I-STAT TROPONIN, ED    EKG EKG Interpretation  Date/Time:  Monday January 31 2019 18:22:49 EDT Ventricular Rate:  62 PR Interval:  176 QRS Duration: 76 QT Interval:  384 QTC Calculation: 389 R Axis:   55 Text Interpretation:  Normal sinus rhythm Possible Anterior infarct , age undetermined Abnormal ECG Confirmed by Raeford Razor [id number]) on 01/31/2019 9:34:02 PM Also confirmed by Raeford Razor [phone]), editor Barbette Hair [phone])  on 02/01/2019 6:57:14 AM   Radiology Dg Chest 2 View  Result Date: 01/31/2019 CLINICAL DATA:  Chest pain.  Right rib pain. EXAM: CHEST - 2 VIEW COMPARISON:  12/07/2018 FINDINGS: Moderate lower thoracic spondylosis. Midline trachea. Normal heart size. Atherosclerosis in the transverse aorta. Right hemidiaphragm elevation is mild. No pleural effusion or pneumothorax. Left lower lobe scarring laterally. IMPRESSION: No acute cardiopulmonary disease. Aortic Atherosclerosis (ICD10-I70.0). Electronically Signed   By: Jeronimo Greaves M.D.   On: 01/31/2019 19:55   Ct Abdomen Pelvis W Contrast  Result Date: 01/31/2019 CLINICAL DATA:  73 year old female with left side abdominal pain for 2 days. EXAM: CT ABDOMEN AND PELVIS WITH CONTRAST TECHNIQUE: Multidetector CT imaging of the abdomen and pelvis was performed using the standard protocol following bolus administration of intravenous contrast. CONTRAST:  OMNIPAQUE IOHEXOL 300 MG/ML  SOLN COMPARISON:  CT Abdomen and Pelvis 12/07/2018. FINDINGS: Lower chest: Mild bibasilar atelectasis. Stable cardiomegaly. No pericardial or pleural effusion. Hepatobiliary: Mild motion artifact. Negative liver and gallbladder. Pancreas: Negative. Spleen: Stable and negative spleen. Adrenals/Urinary  Tract: Normal adrenal glands. Resolved right hydronephrosis and perinephric edema. Today there is symmetric renal enhancement and contrast excretion. No definite nephrolithiasis. Proximal ureters are within normal limits. Bilateral pelvic phleboliths, but no convincing ureteral calculus. Diminutive and unremarkable urinary bladder. Stomach/Bowel: Retained stool in the rectum similar to the prior CT. Severe diverticulosis of the sigmoid colon with no active inflammation identified. Severe diverticulosis continues to the splenic flexure and involves the distal half of the transverse colon. No active inflammation identified. Mild motion artifact at the level of the hepatic flexure. Mild retained stool in the proximal colon. No large bowel wall thickening identified. 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 Negative terminal ileum. Diminutive or  absent appendix. No dilated small bowel. Negative stomach aside from possible small hiatal hernia. No free air, free fluid. Vascular/Lymphatic: Mild Aortoiliac calcified atherosclerosis. Major arterial structures are patent. Portal venous system is patent. No lymphadenopathy. Reproductive: Surgically absent uterus. Diminutive or absent ovaries. Other: No pelvic free fluid. Musculoskeletal: No acute osseous abnormality identified. IMPRESSION: 1. No acute or inflammatory process identified in the abdomen or pelvis. There is Severe diverticulosis from the latter half of the transverse colon through the sigmoid colon, but no active inflammation identified. 2. Resolved right side obstructive uropathy since January. 3. Mild cardiomegaly. Electronically Signed   By: Odessa Fleming M.D.   On: 01/31/2019 22:36    Procedures Procedures (including critical care time)  Medications Ordered in ED Medications - No data to display   Initial Impression / Assessment and Plan / ED Course  I have reviewed the triage vital signs and the nursing notes.  Pertinent labs &  imaging results that were available during my care of the patient were reviewed by me and considered in my medical decision making (see chart for details).        73 year old female with what she calls chest pain but points to her epigastric region and she is focally tender there on exam.  Gastritis, peptic ulcer disease, biliary colic, cholelithiasis, cholecystitis, cholangitis, hepatitis, renal colic, urinary tract infection, colitis, constipation, gastroenteritis, atypical ACS, mesenteric ischemia all considered among other etiologies in the patient's differential diagnosis.   ED work-up pretty unremarkable.  Atypical since her ACS.  Doubt PE, dissection of the emergent process.  Work-up pretty unremarkable.  Lipase normal.  CT abdomen pelvis without acute abnormality.   It has been determined that no acute conditions requiring further emergency intervention are present at this time. The patient has been advised of the diagnosis and plan. I reviewed any labs and imaging including any potential incidental findings. I have reviewed nursing notes and appropriate previous records. We have discussed signs and symptoms that warrant return to the ED and they are listed in the discharge instructions.      Final Clinical Impressions(s) / ED Diagnoses   Final diagnoses:  Epigastric pain    ED Discharge Orders    None       Raeford Razor, MD 02/01/19 2106

## 2019-01-31 NOTE — ED Notes (Signed)
Spoke with pt's daughter, she will come pick pt up.

## 2019-02-01 NOTE — ED Notes (Addendum)
Waste of 4mg  IV Morphine with [place], Charity fundraiser. Pt already discharged out of system. This note written at [phone] 02/01/19

## 2019-02-01 NOTE — ED Notes (Signed)
Patient verbalizes understanding of discharge instructions. Opportunity for questioning and answers were provided. Armband removed by staff, pt discharged from ED, ambulatory to lobby with daughter.

## 2019-11-11 DIAGNOSIS — Z789 Other specified health status: Secondary | ICD-10-CM

## 2019-11-11 HISTORY — DX: Other specified health status: Z78.9

## 2019-12-03 ENCOUNTER — Encounter (HOSPITAL_COMMUNITY): Payer: Self-pay | Admitting: *Deleted

## 2019-12-03 ENCOUNTER — Other Ambulatory Visit: Payer: Self-pay

## 2019-12-03 ENCOUNTER — Emergency Department (HOSPITAL_COMMUNITY): Payer: Medicare Other

## 2019-12-03 ENCOUNTER — Emergency Department (HOSPITAL_COMMUNITY)
Admission: EM | Admit: 2019-12-03 | Discharge: 2019-12-03 | Disposition: A | Discharge status: Home / Self Care | Payer: Medicare Other | Attending: Emergency Medicine | Admitting: Emergency Medicine

## 2019-12-03 DIAGNOSIS — G309 Alzheimer's disease, unspecified: Secondary | ICD-10-CM | POA: Diagnosis not present

## 2019-12-03 DIAGNOSIS — R109 Unspecified abdominal pain: Secondary | ICD-10-CM | POA: Diagnosis present

## 2019-12-03 DIAGNOSIS — Z79899 Other long term (current) drug therapy: Secondary | ICD-10-CM | POA: Diagnosis not present

## 2019-12-03 DIAGNOSIS — R1084 Generalized abdominal pain: Secondary | ICD-10-CM

## 2019-12-03 LAB — COMPREHENSIVE METABOLIC PANEL
ALT: 7 U/L (ref 0–44)
AST: 14 U/L — ABNORMAL LOW (ref 15–41)
Albumin: 3.9 g/dL (ref 3.5–5.0)
Alkaline Phosphatase: 68 U/L (ref 38–126)
Anion gap: 11 (ref 5–15)
BUN: 6 mg/dL — ABNORMAL LOW (ref 8–23)
CO2: 22 mmol/L (ref 22–32)
Calcium: 9.4 mg/dL (ref 8.9–10.3)
Chloride: 106 mmol/L (ref 98–111)
Creatinine, Ser: 0.73 mg/dL (ref 0.44–1.00)
GFR calc Af Amer: >60 mL/min (ref >60)
GFR calc non Af Amer: >60 mL/min (ref >60)
Glucose, Bld: 101 mg/dL — ABNORMAL HIGH (ref 70–99)
Potassium: 3.6 mmol/L (ref 3.5–5.1)
Sodium: 139 mmol/L (ref 135–145)
Total Bilirubin: 1.4 mg/dL — ABNORMAL HIGH (ref 0.3–1.2)
Total Protein: 7.1 g/dL (ref 6.5–8.1)

## 2019-12-03 LAB — URINALYSIS, ROUTINE W REFLEX MICROSCOPIC
Bacteria, UA: NONE SEEN
Bilirubin Urine: NEGATIVE
Glucose, UA: NEGATIVE mg/dL
Hgb urine dipstick: NEGATIVE
Ketones, ur: 20 mg/dL — AB
Leukocytes,Ua: NEGATIVE
Nitrite: NEGATIVE
Protein, ur: 100 mg/dL — AB
Specific Gravity, Urine: 1.029 (ref 1.005–1.030)
pH: 5 (ref 5.0–8.0)

## 2019-12-03 LAB — CBC
HCT: 40.1 % (ref 36.0–46.0)
Hemoglobin: 13.2 g/dL (ref 12.0–15.0)
MCH: 29.4 pg (ref 26.0–34.0)
MCHC: 32.9 g/dL (ref 30.0–36.0)
MCV: 89.3 fL (ref 80.0–100.0)
Platelets: 215 10*3/uL (ref 150–400)
RBC: 4.49 MIL/uL (ref 3.87–5.11)
RDW: 15.3 % (ref 11.5–15.5)
WBC: 6.1 10*3/uL (ref 4.0–10.5)
nRBC: 0 % (ref 0.0–0.2)

## 2019-12-03 LAB — LIPASE, BLOOD: Lipase: 22 U/L (ref 11–51)

## 2019-12-03 MED ORDER — SODIUM CHLORIDE 0.9% FLUSH: 3.0000 mL | Freq: Once | INTRAVENOUS | Status: DC

## 2019-12-03 NOTE — ED Notes (Signed)
Called daughter to give update and advise about discharge. Daughter enroute to pick up pt. approx 15 min out

## 2019-12-03 NOTE — Discharge Instructions (Signed)
Please make sure Monica Erickson has follow up with your Primary Care Physician. If she does not have a Primary Care Physician she should establish care with one soon to have follow up for her abdominal pain.

## 2019-12-03 NOTE — ED Triage Notes (Addendum)
The  Pt is c/o abd pain for 2-3 days no bm for 2 days the pt has dementia and her daughter answers questions for her.  Confused and disoriented

## 2019-12-03 NOTE — ED Notes (Signed)
Pt to ct 

## 2019-12-03 NOTE — ED Provider Notes (Signed)
[facility] EMERGENCY DEPARTMENT Provider Note   CSN: [id number] Arrival date & time: 12/03/19  1649    History Chief Complaint  Patient presents with   Abdominal Pain    Monica Erickson is a 74 y.o. female.  HPI  Monica Erickson is a 73y/o female with PMH of alzheimers dementia, nephrolithasis, and TKR who presents today for abdominal pain. Patient is only oriented to person. Her daughter is present in the room with her and is the primary historian s patient has difficulty answering questions about her health. Per daughter this began 3 days ago. Her mom began complaining of stomach pain and would bend over and wince in pain. They have tried offering her favorite foods but she declines them. She has been able to drink Ensure over the past 3 days and drink but will not take anything else. She has not had a bowel movement in 3 days. Per daughter she has perhaps had some nausea with excessive belching but vomiting. No diarrhea or fever. Her mental status today is at baseline.    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   Dementia without behavioral disturbance (HCC) 12/07/2018    Past Surgical History:  Procedure Laterality Date   CYSTOSCOPY WITH STENT PLACEMENT Right 12/07/2018   Procedure: CYSTOSCOPY WITH STENT PLACEMENT;  Surgeon: Crist Fat, MD;  Location: [facility];  Service: Urology;  Laterality: Right;   CYSTOSCOPY/URETEROSCOPY/HOLMIUM LASER/STENT PLACEMENT Right 12/31/2018   Procedure: CYSTOSCOPY RIGHT URETEROSCOPY/HOLMIUM LASER RIGHT Monica Erickson;  Surgeon: Crist Fat, MD;  Location: [facility];  Service: Urology;  Laterality: Right;   JOINT REPLACEMENT  5 YRS AGO   KNEE REPLACEMENT     OB History   No obstetric history on file.     No family history on file.  Social History   Tobacco Use    Smoking status: Never Smoker   Smokeless tobacco: Never Used  Substance Use Topics   Alcohol use: Never   Drug use: Never    Home Medications Prior to Admission medications   Medication Sig Start Date End Date Taking? Authorizing Provider  amoxicillin-clavulanate (AUGMENTIN) 875-125 MG tablet Take 1 tablet by mouth 2 (two) times daily.    [provider]  phenazopyridine (PYRIDIUM) 200 MG tablet Take 1 tablet (200 mg total) by mouth 3 (three) times daily as needed for pain. 12/31/18   Crist Fat, MD  traMADol (ULTRAM) 50 MG tablet Take 1-2 tablets (50-100 mg total) by mouth every 6 (six) hours as needed for moderate pain. 12/31/18   Crist Fat, MD    Allergies    Patient has no known allergies.  Review of Systems   Review of Systems  Constitutional: Positive for appetite change. Negative for activity change, diaphoresis, fatigue and fever.  HENT: Negative for congestion, rhinorrhea, sinus pressure, sinus pain and sore throat.   Respiratory: Negative for cough, chest tightness, shortness of breath and wheezing.   Cardiovascular: Negative for chest pain, palpitations and leg swelling.  Gastrointestinal: Positive for abdominal pain, constipation and nausea. Negative for abdominal distention, diarrhea, rectal pain and vomiting.  Genitourinary: Negative for decreased urine volume, dysuria, frequency and urgency.  Skin: Negative for rash.    Physical Exam Updated Vital Signs BP (!) 149/90 (BP Location: Right Arm)    Pulse 67  Temp 99.9 F (37.7 C) (Oral)    Resp 14    Ht 5\' 10"  (1.778 m)    Wt 86.2 kg    SpO2 99%    BMI 27.26 kg/m   Physical Exam Vitals and nursing note reviewed.  Constitutional:      General: She is not in acute distress.    Appearance: She is not ill-appearing.  HENT:     Head: Normocephalic and atraumatic.  Eyes:     Extraocular Movements: Extraocular movements intact.     Pupils: Pupils are equal, round, and reactive to light.    Cardiovascular:     Rate and Rhythm: Normal rate and regular rhythm.     Heart sounds: Normal heart sounds. No murmur.  Pulmonary:     Effort: Pulmonary effort is normal.     Breath sounds: Normal breath sounds. No wheezing, rhonchi or rales.  Abdominal:     General: Abdomen is flat. Bowel sounds are normal. There is no distension.     Palpations: Abdomen is soft.     Tenderness: There is no abdominal tenderness.  Skin:    General: Skin is warm.     Findings: No erythema or rash.  Neurological:     General: No focal deficit present.     Mental Status: She is alert.     ED Results / Procedures / Treatments   Labs (all labs ordered are listed, but only abnormal results are displayed) Labs Reviewed  COMPREHENSIVE METABOLIC PANEL - Abnormal; Notable for the following components:      Result Value   Glucose, Bld 101 (*)    BUN 6 (*)    AST 14 (*)    Total Bilirubin 1.4 (*)    All other components within normal limits  URINALYSIS, ROUTINE W REFLEX MICROSCOPIC - Abnormal; Notable for the following components:   Color, Urine AMBER (*)    APPearance HAZY (*)    Ketones, ur 20 (*)    Protein, ur 100 (*)    All other components within normal limits  LIPASE, BLOOD  CBC    EKG None  Radiology CT ABDOMEN PELVIS WO CONTRAST  Result Date: 12/03/2019 CLINICAL DATA:  Abdominal pain. EXAM: CT ABDOMEN AND PELVIS WITHOUT CONTRAST TECHNIQUE: Multidetector CT imaging of the abdomen and pelvis was performed following the standard protocol without IV contrast. COMPARISON:  January 31, 2019 FINDINGS: Lower chest: There is mild cardiomegaly. Hepatobiliary: No focal liver abnormality is seen. No gallstones, gallbladder wall thickening, or biliary dilatation. Pancreas: Unremarkable. No pancreatic ductal dilatation or surrounding inflammatory changes. Spleen: Normal in size without focal abnormality. Adrenals/Urinary Tract: Adrenal glands are unremarkable. Kidneys are normal, without renal calculi,  focal lesion, or hydronephrosis. Bladder is unremarkable. Stomach/Bowel: Stomach is within normal limits. The appendix is not clearly identified. No evidence of bowel wall thickening, distention, or inflammatory changes. Noninflamed diverticula are seen throughout the large bowel. Vascular/Lymphatic: Mild aortic atherosclerosis. No enlarged abdominal or pelvic lymph nodes. Reproductive: Status post hysterectomy. No adnexal masses. Other: No abdominal wall hernia or abnormality. No abdominopelvic ascites. Musculoskeletal: Multilevel degenerative changes seen throughout the lumbar spine. IMPRESSION: 1. Mild, stable cardiomegaly. 2. Colonic diverticulosis. Electronically Signed   By: February 02, 2019 M.D.   On: 12/03/2019 22:36   DG Abdomen 1 View  Result Date: 12/03/2019 CLINICAL DATA:  Abdominal pain. EXAM: ABDOMEN - 1 VIEW COMPARISON:  None. FINDINGS: The bowel gas pattern is normal. No radio-opaque calculi or other significant radiographic abnormality are seen. Subcentimeter phleboliths are  noted within the lower pelvis. IMPRESSION: Negative. Electronically Signed   By: Virgina Norfolk M.D.   On: 12/03/2019 17:49    Procedures Procedures (including critical care time)  Medications Ordered in ED Medications  sodium chloride flush (NS) 0.9 % injection 3 mL (3 mLs Intravenous Not Given 12/03/19 2058)    ED Course  I have reviewed the triage vital signs and the nursing notes.  Pertinent labs & imaging results that were available during my care of the patient were reviewed by me and considered in my medical decision making (see chart for details).  Monica Erickson is a 74y/o female with PMH of dementia and previous nephrolithasis that presented to the ED with 3 days of abdominal pain. Differential for her was broad including colitis, UTI, pancreatitis, cholelithiasis, ischemic bowel, and aortic aneurism.  Her history and presentation and physical exam were all reassuring making complication from  aortic aneurism unlikely. Given she had no rebound tenderness or guarding on exam and seemed relatively comfortable bowel ischemia is unlikely. Her initial work up was significant normal Lipase, CMP, and CBC returned. Her U/A showed no nitrites or leukocytes. She had a KUB performed was negative for any acute abnormality. Given her previous workup was all negative for any acute pathology A CT scan of her abdomen was performed and was significant for mild stable cardiomegaly and colonic diverticulosis. No acute pathology.    MDM Rules/Calculators/A&P                     Monica Erickson was evaluated in Emergency Department on 12/03/2019 for the symptoms described in the history of present illness. She was evaluated in the context of the global COVID-19 pandemic, which necessitated consideration that the patient might be at risk for infection with the SARS-CoV-2 virus that causes COVID-19. Institutional protocols and algorithms that pertain to the evaluation of patients at risk for COVID-19 are in a state of rapid change based on information released by regulatory bodies including the CDC and federal and state organizations. These policies and algorithms were followed during the patient's care in the ED  Final Clinical Impression(s) / ED Diagnoses Final diagnoses:  Generalized abdominal pain    Rx / DC Orders ED Discharge Orders    None       Nuala Alpha, DO 12/03/19 2251    Elnora Morrison, MD 12/03/19 2341

## 2019-12-03 NOTE — ED Notes (Signed)
Spoke with daughter (caregiver) and she verbalized  Understanding of d/c instructions and follow up care. She had no additional questions at this time.

## 2019-12-03 NOTE — ED Provider Notes (Signed)
ATTENDING SUPERVISORY NOTE I have personally viewed the imaging studies performed. I have personally seen and examined the patient, and discussed the plan of care with the resident.  I have reviewed the documentation of the resident and agree.  No diagnosis found.  Ultrasound ED Peripheral IV (Provider)  Date/Time: 12/03/2019 9:06 PM Performed by: Blane Ohara, MD Authorized by: Blane Ohara, MD   Procedure details:    Indications: multiple failed IV attempts     Skin Prep: chlorhexidine gluconate     Location:  Right AC   Angiocath:  20 G   Bedside Ultrasound Guided: Yes     Images: archived     Patient tolerated procedure without complications: Yes        Blane Ohara, MD 12/03/19 [phone]

## 2019-12-06 ENCOUNTER — Emergency Department (HOSPITAL_COMMUNITY): Payer: Medicare Other

## 2019-12-06 ENCOUNTER — Other Ambulatory Visit: Payer: Self-pay

## 2019-12-06 ENCOUNTER — Emergency Department (HOSPITAL_COMMUNITY)
Admission: EM | Admit: 2019-12-06 | Discharge: 2019-12-06 | Disposition: A | Discharge status: Home / Self Care | Payer: Medicare Other | Attending: Emergency Medicine | Admitting: Emergency Medicine

## 2019-12-06 ENCOUNTER — Encounter (HOSPITAL_COMMUNITY): Payer: Self-pay

## 2019-12-06 DIAGNOSIS — R630 Anorexia: Secondary | ICD-10-CM | POA: Diagnosis not present

## 2019-12-06 DIAGNOSIS — F0390 Unspecified dementia without behavioral disturbance: Secondary | ICD-10-CM | POA: Diagnosis not present

## 2019-12-06 DIAGNOSIS — R109 Unspecified abdominal pain: Secondary | ICD-10-CM | POA: Diagnosis present

## 2019-12-06 DIAGNOSIS — Z20822 Contact with and (suspected) exposure to covid-19: Secondary | ICD-10-CM | POA: Diagnosis not present

## 2019-12-06 DIAGNOSIS — E876 Hypokalemia: Secondary | ICD-10-CM

## 2019-12-06 LAB — COMPREHENSIVE METABOLIC PANEL
ALT: 8 U/L (ref 0–44)
AST: 15 U/L (ref 15–41)
Albumin: 4 g/dL (ref 3.5–5.0)
Alkaline Phosphatase: 68 U/L (ref 38–126)
Anion gap: 13 (ref 5–15)
BUN: 10 mg/dL (ref 8–23)
CO2: 20 mmol/L — ABNORMAL LOW (ref 22–32)
Calcium: 9.5 mg/dL (ref 8.9–10.3)
Chloride: 104 mmol/L (ref 98–111)
Creatinine, Ser: 0.94 mg/dL (ref 0.44–1.00)
GFR calc Af Amer: >60 mL/min (ref >60)
GFR calc non Af Amer: >60 mL/min (ref >60)
Glucose, Bld: 127 mg/dL — ABNORMAL HIGH (ref 70–99)
Potassium: 3.1 mmol/L — ABNORMAL LOW (ref 3.5–5.1)
Sodium: 137 mmol/L (ref 135–145)
Total Bilirubin: 1.4 mg/dL — ABNORMAL HIGH (ref 0.3–1.2)
Total Protein: 7.3 g/dL (ref 6.5–8.1)

## 2019-12-06 LAB — URINALYSIS, ROUTINE W REFLEX MICROSCOPIC
Bilirubin Urine: NEGATIVE
Glucose, UA: NEGATIVE mg/dL
Hgb urine dipstick: NEGATIVE
Ketones, ur: 80 mg/dL — AB
Leukocytes,Ua: NEGATIVE
Nitrite: NEGATIVE
Protein, ur: 30 mg/dL — AB
Specific Gravity, Urine: 1.024 (ref 1.005–1.030)
pH: 5 (ref 5.0–8.0)

## 2019-12-06 LAB — RESPIRATORY PANEL BY RT PCR (FLU A&B, COVID)
Influenza A by PCR: NEGATIVE
Influenza B by PCR: NEGATIVE
SARS Coronavirus 2 by RT PCR: NEGATIVE

## 2019-12-06 LAB — CBC
HCT: 40.4 % (ref 36.0–46.0)
Hemoglobin: 13.8 g/dL (ref 12.0–15.0)
MCH: 29.9 pg (ref 26.0–34.0)
MCHC: 34.2 g/dL (ref 30.0–36.0)
MCV: 87.6 fL (ref 80.0–100.0)
Platelets: 258 10*3/uL (ref 150–400)
RBC: 4.61 MIL/uL (ref 3.87–5.11)
RDW: 15 % (ref 11.5–15.5)
WBC: 5.7 10*3/uL (ref 4.0–10.5)
nRBC: 0 % (ref 0.0–0.2)

## 2019-12-06 LAB — TROPONIN I (HIGH SENSITIVITY): Troponin I (High Sensitivity): 8 ng/L (ref <18)

## 2019-12-06 LAB — LIPASE, BLOOD: Lipase: 25 U/L (ref 11–51)

## 2019-12-06 LAB — MAGNESIUM: Magnesium: 1.8 mg/dL (ref 1.7–2.4)

## 2019-12-06 MED ORDER — SODIUM CHLORIDE 0.9 % IV BOLUS: 500.0000 mL | Freq: Once | INTRAVENOUS | Status: DC

## 2019-12-06 MED ORDER — HYDRALAZINE HCL 20 MG/ML IJ SOLN
5.0000 mg | Freq: Once | INTRAMUSCULAR | Status: AC
  Administered 2019-12-06: 5 mg via INTRAVENOUS
  Filled 2019-12-06: qty 1

## 2019-12-06 MED ORDER — POTASSIUM CHLORIDE ER 10 MEQ PO TBCR: 30.0000 meq | EXTENDED_RELEASE_TABLET | Freq: Every day | ORAL | 0 refills | Status: DC

## 2019-12-06 MED ORDER — POTASSIUM CHLORIDE CRYS ER 20 MEQ PO TBCR
40.0000 meq | EXTENDED_RELEASE_TABLET | Freq: Once | ORAL | Status: AC
  Administered 2019-12-06: 40 meq via ORAL
  Filled 2019-12-06: qty 2

## 2019-12-06 MED ORDER — HALOPERIDOL LACTATE 5 MG/ML IJ SOLN
2.0000 mg | Freq: Once | INTRAMUSCULAR | Status: AC
  Administered 2019-12-06: 22:00:00 2 mg via INTRAVENOUS
  Filled 2019-12-06: qty 1

## 2019-12-06 MED ORDER — MAGNESIUM CHLORIDE 64 MG PO TBEC
1.0000 | DELAYED_RELEASE_TABLET | Freq: Once | ORAL | Status: DC
  Filled 2019-12-06: qty 1

## 2019-12-06 MED ORDER — LORAZEPAM 2 MG/ML IJ SOLN
1.0000 mg | Freq: Once | INTRAMUSCULAR | Status: AC
  Administered 2019-12-06: 22:00:00 1 mg via INTRAVENOUS
  Filled 2019-12-06: qty 1

## 2019-12-06 MED ORDER — LORAZEPAM 2 MG/ML IJ SOLN
1.0000 mg | Freq: Once | INTRAMUSCULAR | Status: AC
  Administered 2019-12-06: 1 mg via INTRAVENOUS
  Filled 2019-12-06: qty 1

## 2019-12-06 MED ORDER — SODIUM CHLORIDE 0.9 % IV BOLUS: 1000.0000 mL | Freq: Once | INTRAVENOUS | Status: DC

## 2019-12-06 NOTE — ED Notes (Signed)
Pt's daughter verbalized understanding of discharge instructions. Follow up care reviewed. PT's daughter had no further questions at this time.

## 2019-12-06 NOTE — Discharge Instructions (Signed)
Have your primary care provider recheck your potassium level in 1 week. Take the potassium pills as directed. Return to the ED for injuries or falls, chest pain, shortness of breath.

## 2019-12-06 NOTE — ED Notes (Signed)
Patient transported to CT 

## 2019-12-06 NOTE — ED Triage Notes (Signed)
Pt reports continued abd pain, pt not eating like normal due to the pain, no vomiting. Pt alert, oriented to self. Daughter waiting in car.

## 2019-12-06 NOTE — ED Provider Notes (Signed)
[facility] EMERGENCY DEPARTMENT Provider Note   CSN: [id number] Arrival date & time: 12/06/19  1635     History Chief Complaint  Patient presents with  . Abdominal Pain    Monica Erickson is a 74 y.o. female with a past medical history of dementia, kidney stones presenting to the ED with a chief complaint of abdominal pain.  Majority of history is provided by the daughter over the phone.  States that patient has continued decreased appetite, complains of abdominal pain and fatigue since 12/02/2019.  She was seen and evaluated here in the ED on 12/03/2019 with negative CT scan, lab work, urinalysis and was discharged home.  Patient does have an appointment with her psychiatrist in 3 days as well as her PCP in 2 weeks but daughter brought her back to the emergency department today because she was "complaining of pain in her stomach still."  She is unsure if her mother is "malnourished" and is unsure why she has been losing weight since her kidney stone surgery approximately 1 year ago.  She denies any diarrhea, vomiting, sick contacts with similar symptoms, fevers, injuries or falls.  HPI     Past Medical History:  Diagnosis Date  . 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 Stone, kidney 12/07/2018  . Dementia without behavioral disturbance ([place]) 12/07/2018    Past Surgical History:  Procedure Laterality Date  . CYSTOSCOPY WITH STENT PLACEMENT Right 12/07/2018   Procedure: CYSTOSCOPY WITH STENT PLACEMENT;  Surgeon: Ardis Hughs, MD;  Location: [facility];  Service: Urology;  Laterality: Right;  . CYSTOSCOPY/URETEROSCOPY/HOLMIUM LASER/STENT PLACEMENT Right 12/31/2018   Procedure: CYSTOSCOPY RIGHT URETEROSCOPY/HOLMIUM LASER RIGHT Thyra Breed;  Surgeon: Ardis Hughs, MD;  Location: [facility];  Service: Urology;  Laterality: Right;  . JOINT REPLACEMENT  5 YRS AGO   KNEE REPLACEMENT      OB History   No obstetric history on file.     No family history on file.  Social History   Tobacco Use  . Smoking status: Never Smoker  . Smokeless tobacco: Never Used  Substance Use Topics  . Alcohol use: Never  . Drug use: Never    Home Medications Prior to Admission medications   Medication Sig Start Date End Date Taking? Authorizing Provider  amoxicillin-clavulanate (AUGMENTIN) 875-125 MG tablet Take 1 tablet by mouth 2 (two) times daily.    [provider]  phenazopyridine (PYRIDIUM) 200 MG tablet Take 1 tablet (200 mg total) by mouth 3 (three) times daily as needed for pain. 12/31/18   Ardis Hughs, MD  potassium chloride (KLOR-CON) 10 MEQ tablet Take 3 tablets (30 mEq total) by mouth daily for 3 days. 12/06/19 12/09/19  Laylamarie Meuser, PA-C  traMADol (ULTRAM) 50 MG tablet Take 1-2 tablets (50-100 mg total) by mouth every 6 (six) hours as needed for moderate pain. 12/31/18   Ardis Hughs, MD    Allergies    Patient has no known allergies.  Review of Systems   Review of Systems  Unable to perform ROS: Dementia  Constitutional: Positive for appetite change.  Gastrointestinal: Positive for abdominal pain.    Physical Exam Updated Vital Signs BP (!) 165/98   Pulse 79   Temp 99.2 F (37.3 C) (Oral)   Resp (!) 26   SpO2 99%   Physical Exam Vitals and nursing note reviewed.  Constitutional:  General: She is not in acute distress.    Appearance: She is well-developed.  HENT:     Head: Normocephalic and atraumatic.     Nose: Nose normal.  Eyes:     General: No scleral icterus.       Left eye: No discharge.     Conjunctiva/sclera: Conjunctivae normal.  Cardiovascular:     Rate and Rhythm: Normal rate and regular rhythm.     Heart sounds: Normal heart sounds. No murmur. No friction rub. No gallop.   Pulmonary:     Effort: Pulmonary effort is normal. No respiratory distress.     Breath sounds: Normal breath sounds.  Abdominal:      General: Bowel sounds are normal. There is no distension.     Palpations: Abdomen is soft.     Tenderness: There is no abdominal tenderness. There is no guarding.  Musculoskeletal:        General: Normal range of motion.     Cervical back: Normal range of motion and neck supple.  Skin:    General: Skin is warm and dry.     Findings: No rash.  Neurological:     Mental Status: She is alert.     Motor: No abnormal muscle tone.     Coordination: Coordination normal.     ED Results / Procedures / Treatments   Labs (all labs ordered are listed, but only abnormal results are displayed) Labs Reviewed  COMPREHENSIVE METABOLIC PANEL - Abnormal; Notable for the following components:      Result Value   Potassium 3.1 (*)    CO2 20 (*)    Glucose, Bld 127 (*)    Total Bilirubin 1.4 (*)    All other components within normal limits  URINALYSIS, ROUTINE W REFLEX MICROSCOPIC - Abnormal; Notable for the following components:   Ketones, ur 80 (*)    Protein, ur 30 (*)    Bacteria, UA RARE (*)    All other components within normal limits  RESPIRATORY PANEL BY RT PCR (FLU A&B, COVID)  LIPASE, BLOOD  CBC  MAGNESIUM  TROPONIN I (HIGH SENSITIVITY)    EKG EKG Interpretation  Date/Time:  Tuesday December 06 2019 18:33:57 EST Ventricular Rate:  78 PR Interval:    QRS Duration: 84 QT Interval:  390 QTC Calculation: 445 R Axis:   40 Text Interpretation: Ectopic atrial rhythm Anterior infarct, old No significant change since last tracing Confirmed by Richardean Canal [phone]) on 12/06/2019 7:44:52 PM   Radiology DG Chest 2 View  Result Date: 12/06/2019 CLINICAL DATA:  74 year old female with epigastric abdominal pain, loss of appetite. EXAM: CHEST - 2 VIEW COMPARISON:  Chest radiographs 01/31/2019 and earlier. Recent CT Abdomen and Pelvis 12/03/2019. FINDINGS: Upright AP and lateral views of the chest. Tortuous descending thoracic aorta. Stable cardiac size and mediastinal contours. Visualized  tracheal air column is within normal limits. Both lungs appear clear. No pneumothorax or pleural effusion. No acute osseous abnormality identified. Negative visible bowel gas pattern. IMPRESSION: No acute cardiopulmonary abnormality. Electronically Signed   By: Odessa Fleming M.D.   On: 12/06/2019 18:23   CT Head Wo Contrast  Result Date: 12/06/2019 CLINICAL DATA:  Transient ischemic attack. EXAM: CT HEAD WITHOUT CONTRAST TECHNIQUE: Contiguous axial images were obtained from the base of the skull through the vertex without intravenous contrast. COMPARISON:  None. FINDINGS: Brain: There is mild to moderate severity cerebral atrophy with widening of the extra-axial spaces and ventricular dilatation. There are areas of decreased attenuation  within the white matter tracts of the supratentorial brain, consistent with microvascular disease changes. There is mild symmetric bilateral basal ganglia calcification. Vascular: No hyperdense vessel or unexpected calcification. Skull: Normal. Negative for fracture or focal lesion. Sinuses/Orbits: No acute finding. Other: None. IMPRESSION: 1. No acute intracranial pathology. 2. 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 Electronically Signed   By: Aram Candela M.D.   On: 12/06/2019 22:54    Procedures Procedures (including critical care time)  Medications Ordered in ED Medications  sodium chloride 0.9 % bolus 500 mL (has no administration in time range)  potassium chloride SA (KLOR-CON) CR tablet 40 mEq (40 mEq Oral Given 12/06/19 1834)  hydrALAZINE (APRESOLINE) injection 5 mg (5 mg Intravenous Given 12/06/19 2123)  LORazepam (ATIVAN) injection 1 mg (1 mg Intravenous Given 12/06/19 2121)  LORazepam (ATIVAN) injection 1 mg (1 mg Intravenous Given 12/06/19 2219)  haloperidol lactate (HALDOL) injection 2 mg (2 mg Intravenous Given 12/06/19 2220)    ED Course  I have reviewed the triage vital signs and the  nursing notes.  Pertinent labs & imaging results that were available during my care of the patient were reviewed by me and considered in my medical decision making (see chart for details).    MDM Rules/Calculators/A&P                      74 year old female presents to ED for ongoing abdominal pain and decreased appetite.  She was seen and evaluated on 12/03/2019 with similar symptoms, reassuring work-up and was discharged home.  She is scheduled for a follow-up appointment with her psychiatrist and PCP but daughter is concerned due to her ongoing complaints of abdominal pain.  Patient has a history of dementia so unable to obtain history from her.  Work-up here shows potassium of 3.1 which was repleted orally.  Magnesium is normal.  Troponin is normal.  Respiratory panel is negative for flu and Covid.  CBC and lipase are unremarkable.  EKG is unchanged from priors.  Urinalysis with ketonuria but no signs of infection.  Chest x-Davee is unremarkable. CT head unremarkable. Will discharge home with PCP f/u and PO potassium. Daughter to pick patient up. I have updated her on the plan.  Patient is hemodynamically stable, in NAD, and able to ambulate in the ED. Evaluation does not show pathology that would require ongoing emergent intervention or inpatient treatment. I explained the diagnosis to the patient. Pain has been managed and has no complaints prior to discharge. Patient is comfortable with above plan and is stable for discharge at this time. All questions were answered prior to disposition. Strict return precautions for returning to the ED were discussed. Encouraged follow up with PCP.   An After Visit Summary was printed and given to the patient.   Portions of this note were generated with Scientist, clinical (histocompatibility and immunogenetics). Dictation errors may occur despite best attempts at proofreading.  Final Clinical Impression(s) / ED Diagnoses Final diagnoses:  Hypokalemia  Decreased appetite    Rx / DC  Orders ED Discharge Orders         Ordered    potassium chloride (KLOR-CON) 10 MEQ tablet  Daily     12/06/19 2254           Dietrich Pates, PA-C 12/06/19 2306    Charlynne Pander, MD 12/08/19 1020

## 2019-12-07 ENCOUNTER — Other Ambulatory Visit: Payer: Self-pay

## 2019-12-10 ENCOUNTER — Ambulatory Visit (HOSPITAL_COMMUNITY): Payer: Self-pay | Admitting: Psychiatry

## 2019-12-15 ENCOUNTER — Encounter: Payer: Self-pay | Admitting: Nurse Practitioner

## 2019-12-15 ENCOUNTER — Other Ambulatory Visit: Payer: Self-pay

## 2019-12-15 ENCOUNTER — Ambulatory Visit (INDEPENDENT_AMBULATORY_CARE_PROVIDER_SITE_OTHER): Payer: Medicare Other | Admitting: Nurse Practitioner

## 2019-12-15 VITALS — BP 113/66 | HR 88 | Temp 98.1°F | Ht 70.0 in | Wt 156.4 lb

## 2019-12-15 DIAGNOSIS — Z136 Encounter for screening for cardiovascular disorders: Secondary | ICD-10-CM

## 2019-12-15 DIAGNOSIS — Z13820 Encounter for screening for osteoporosis: Secondary | ICD-10-CM | POA: Diagnosis not present

## 2019-12-15 DIAGNOSIS — R7309 Other abnormal glucose: Secondary | ICD-10-CM | POA: Diagnosis not present

## 2019-12-15 DIAGNOSIS — Z131 Encounter for screening for diabetes mellitus: Secondary | ICD-10-CM | POA: Diagnosis not present

## 2019-12-15 DIAGNOSIS — Z0000 Encounter for general adult medical examination without abnormal findings: Secondary | ICD-10-CM | POA: Diagnosis not present

## 2019-12-15 DIAGNOSIS — R109 Unspecified abdominal pain: Secondary | ICD-10-CM

## 2019-12-15 DIAGNOSIS — F0280 Dementia in other diseases classified elsewhere without behavioral disturbance: Secondary | ICD-10-CM

## 2019-12-15 DIAGNOSIS — H9201 Otalgia, right ear: Secondary | ICD-10-CM

## 2019-12-15 DIAGNOSIS — G8929 Other chronic pain: Secondary | ICD-10-CM

## 2019-12-15 LAB — POCT GLYCOSYLATED HEMOGLOBIN (HGB A1C): Hemoglobin A1C: 5.4 % (ref 4.0–5.6)

## 2019-12-15 LAB — GLUCOSE, POCT (MANUAL RESULT ENTRY): POC Glucose: 102 mg/dl — AB (ref 70–99)

## 2019-12-15 NOTE — Progress Notes (Signed)
Integrated Behavioral Health Referral Note  Reason for Referral: Monica Erickson is a 74 y.o. female  Pt was referred by NP, Thad Ranger for: outpatient memory care resources   Pt reports the following concerns: daughter would like dementia care resources  Assessment: Patient referred by NP during PCP visit. Patient accompanied by daughter, Monica Erickson. Monica Erickson reports she would like some resources for adult day facility for patient, as well as a home health aide.   Plan: 1. Addressed today: Provided daughter with information on [facility] caregiver support groups and on [facility]. Confirmed with NP that patient being referred for home health services.  2. Referral: NP referred for home health  3. Follow up: as needed  Abigail Butts, LCSW [facility] [phone]

## 2019-12-15 NOTE — Progress Notes (Signed)
Subjective:    Monica Erickson is a 74 y.o. female who presents for a  new patient exam.  She is in today with her daughter.  She is a previous resident [place].  She has been living with her daughter since 2018.  Has a current diagnosis of dementia.  She was seen by neurology on yesterday and prescribed Namenda 10 mg.  She has not started the medication yet.  Her daughter admits that she is going to have some additional neurological testing for further evaluation of her dementia.  Daughter admits that she has night terrors that seem to be getting worse.  She admits that she does cry more at night.  She is not a wander.  She does like to collect things. Her daughter is very concerned about Monica Erickson's current condition.  She recently returned to her home after being with her grandson for 3 months in [place].  Her daughter feels that she may not have been engaged with the family as much while she was there.  She feels like since her return there has been a decline in her eating..  She is complaining of abdominal pain and right ear pain.  This has been evaluated and the findings were negative.  They have started supplementing with Ensure Her daughter has also noticed that she gets nervous easily.  She tends to shake all over if her environment is not what she desires it to be.  She has noticed that this causes her to complain of having to void with having some diarrhea.   ROS Review of Systems  Constitutional: Positive for appetite change.  HENT: Positive for ear pain.   Eyes: Negative.   Respiratory: Negative.   Cardiovascular: Negative.   Gastrointestinal: Positive for abdominal pain.  Endocrine: Negative.   Genitourinary: Negative.   Musculoskeletal: Negative.   Skin: Negative.   Allergic/Immunologic: Negative.   Neurological: Negative.   Hematological: Negative.   Psychiatric/Behavioral: Positive for behavioral problems.      Objective:    Physical Exam  Constitutional: She appears  well-developed and well-nourished.  HENT:  Head: Normocephalic.  Eyes: Pupils are equal, round, and reactive to light.  Cardiovascular: Normal rate, regular rhythm and normal heart sounds.  Pulmonary/Chest: Effort normal and breath sounds normal.  Abdominal: Soft. Bowel sounds are normal.  Musculoskeletal:        General: Normal range of motion.     Cervical back: Normal range of motion and neck supple.  Neurological: She is alert.  Oriented to person  Skin: Skin is warm and dry.    BP 113/66   Pulse 88   Temp 98.1 F (36.7 C)   Ht 5\' 10"  (1.778 m)   Wt 156 lb 6.4 oz (70.9 kg)   SpO2 100%   BMI 22.44 kg/m  Wt Readings from Last 3 Encounters:  12/15/19 156 lb 6.4 oz (70.9 kg)  12/03/19 190 lb (86.2 kg)  01/31/19 200 lb (90.7 kg)     Health Maintenance Due  Topic Date Due  . Hepatitis C Screening  12/06/1945  . TETANUS/TDAP  06/11/1966  . MAMMOGRAM  06/11/1997  . COLONOSCOPY  06/11/1997  . DEXA SCAN  06/11/2012  . PNA vac Low Risk Adult (1 of 2 - PCV13) 06/11/2012  . INFLUENZA VACCINE  06/11/2019    There are no preventive care reminders to display for this patient.  No results found for: TSH Lab Results  Component Value Date   WBC 5.7 12/06/2019   HGB  13.8 12/06/2019   HCT 40.4 12/06/2019   MCV 87.6 12/06/2019   PLT 258 12/06/2019   Lab Results  Component Value Date   NA 137 12/06/2019   K 3.1 (L) 12/06/2019   CO2 20 (L) 12/06/2019   GLUCOSE 127 (H) 12/06/2019   BUN 10 12/06/2019   CREATININE 0.94 12/06/2019   BILITOT 1.4 (H) 12/06/2019   ALKPHOS 68 12/06/2019   AST 15 12/06/2019   ALT 8 12/06/2019   PROT 7.3 12/06/2019   ALBUMIN 4.0 12/06/2019   CALCIUM 9.5 12/06/2019   ANIONGAP 13 12/06/2019   No results found for: CHOL No results found for: HDL No results found for: LDLCALC No results found for: TRIG No results found for: CHOLHDL Lab Results  Component Value Date   HGBA1C 5.4 12/15/2019      Assessment & Plan:   Problem List Items  Addressed This Visit      High   Dementia without behavioral disturbance (HCC) (Chronic)   Relevant Orders   Ambulatory referral to [place]    Other Visit Diagnoses    Healthcare maintenance    -  Primary   Labs pending   Relevant Orders   POCT urinalysis dipstick   POCT glucose (manual entry) (Completed)   Encounter for Medicare annual wellness exam       Relevant Orders   POCT urinalysis dipstick   Screening for diabetes mellitus       Relevant Orders   Basic Metabolic Panel w/GFR   Screening for hypertension       Relevant Orders   Hepatic function panel   Lipid panel   Screening for osteoporosis       Chronic abdominal pain       Relevant Orders   Ambulatory referral to Gastroenterology   Elevated glucose level       Relevant Orders   POCT glycosylated hemoglobin (Hb A1C) (Completed)   Ear pain, right       Relevant Medications   Homeopathic Products (EAR PAIN RELIEF HOMEOPATHIC) SOLN      Meds ordered this encounter  Medications  . Homeopathic Products (EAR PAIN RELIEF HOMEOPATHIC) SOLN    Sig: Place 2 drops in ear(s) 4 (four) times daily as needed.    Dispense:  10 mL    Refill:  0    Order Specific Question:   Supervising Provider    Answer:   Tresa Garter [[id number]]    Follow-up: Return for medical record release.    Vevelyn Francois, NP

## 2019-12-15 NOTE — Patient Instructions (Addendum)
Bone Density Test The bone density test uses a special type of X-Tagliaferri to measure the amount of calcium and other minerals in your bones. It can measure bone density in the hip and the spine. The test procedure is similar to having a regular X-Naim. This test may also be called:  Bone densitometry.  Bone mineral density test.  Dual-energy X-Mesina absorptiometry (DEXA). You may have this test to:  Diagnose a condition that causes weak or thin bones (osteoporosis).  Screen you for osteoporosis.  Predict your risk for a broken bone (fracture).  Determine how well your osteoporosis treatment is working. Tell a health care provider about:  Any allergies you have.  All medicines you are taking, including vitamins, herbs, eye drops, creams, and over-the-counter medicines.  Any problems you or family members have had with anesthetic medicines.  Any blood disorders you have.  Any surgeries you have had.  Any medical conditions you have.  Whether you are pregnant or may be pregnant.  Any medical tests you have had within the past 14 days that used contrast material. What are the risks? Generally, this is a safe procedure. However, it does expose you to a small amount of radiation, which can slightly increase your cancer risk. What happens before the procedure?  Do not take any calcium supplements starting 24 hours before your test.  Remove all metal jewelry, eyeglasses, dental appliances, and any other metal objects. What happens during the procedure?   You will lie down on an exam table. There will be an X-Merolla generator below you and an imaging device above you.  Other devices, such as boxes or braces, may be used to position your body properly for the scan.  The machine will slowly scan your body. You will need to keep still.  The images will show up on a screen in the room. Images will be examined by a specialist after your test is done. The procedure may vary among health  care providers and hospitals. What happens after the procedure?  It is up to you to get your test results. Ask your health care provider, or the department that is doing the test, when your results will be ready. Summary  A bone density test is an imaging test that uses a type of X-Cai to measure the amount of calcium and other minerals in your bones.  The test may be used to diagnose or screen you for a condition that causes weak or thin bones (osteoporosis), predict your risk for a broken bone (fracture), or determine how well your osteoporosis treatment is working.  Do not take any calcium supplements starting 24 hours before your test.  Ask your health care provider, or the department that is doing the test, when your results will be ready. This information is not intended to replace advice given to you by your health care provider. Make sure you discuss any questions you have with your health care provider. Document Revised: 11/12/2017 Document Reviewed: 08/31/2017 Elsevier Patient Education  [place].   Colonoscopy, Adult A colonoscopy is a procedure to look at the entire large intestine. This procedure is done using a long, thin, flexible tube that has a camera on the end. You may have a colonoscopy:  As a part of normal colorectal screening.  If you have certain symptoms, such as: ? A low number of red blood cells in your blood (anemia). ? Diarrhea that does not go away. ? Pain in your abdomen. ? Blood in  your stool. A colonoscopy can help screen for and diagnose medical problems, including:  Tumors.  Extra tissue that grows where mucus forms (polyps).  Inflammation.  Areas of bleeding. Tell your health care provider about:  Any allergies you have.  All medicines you are taking, including vitamins, herbs, eye drops, creams, and over-the-counter medicines.  Any problems you or family members have had with anesthetic medicines.  Any blood disorders you  have.  Any surgeries you have had.  Any medical conditions you have.  Any problems you have had with having bowel movements.  Whether you are pregnant or may be pregnant. What are the risks? Generally, this is a safe procedure. However, problems may occur, including:  Bleeding.  Damage to your intestine.  Allergic reactions to medicines given during the procedure.  Infection. This is rare. What happens before the procedure? Eating and drinking restrictions Follow instructions from your health care provider about eating or drinking restrictions, which may include:  A few days before the procedure: ? Follow a low-fiber diet. ? Avoid nuts, seeds, dried fruit, raw fruits, and vegetables.  1-3 days before the procedure: ? Eat only gelatin dessert or ice pops. ? Drink only clear liquids, such as water, clear juice, clear broth or bouillon, black coffee or tea, or clear soft drinks or sports drinks. ? Avoid liquids that contain red or purple dye.  The day of the procedure: ? Do not eat solid foods. You may continue to drink clear liquids until up to 2 hours before the procedure. ? Do not eat or drink anything starting 2 hours before the procedure, or within the time period that your health care provider recommends. Bowel prep If you were prescribed a bowel prep to take by mouth (orally) to clean out your colon:  Take it as told by your health care provider. Starting the day before your procedure, you will need to drink a large amount of liquid medicine. The liquid will cause you to have many bowel movements of loose stool until your stool becomes almost clear or light green.  If your skin or the opening between the buttocks (anus) gets irritated from diarrhea, you may relieve the irritation using: ? Wipes with medicine in them, such as adult wet wipes with aloe and vitamin E. ? A product to soothe skin, such as petroleum jelly.  If you vomit while drinking the bowel prep: ? Take  a break for up to 60 minutes. ? Begin the bowel prep again. ? Call your health care provider if you keep vomiting or you cannot take the bowel prep without vomiting.  To clean out your colon, you may also be given: ? Laxative medicines. These help you have a bowel movement. ? Instructions for enema use. An enema is liquid medicine injected into your rectum. Medicines Ask your health care provider about:  Changing or stopping your regular medicines or supplements. This is especially important if you are taking iron supplements, diabetes medicines, or blood thinners.  Taking medicines such as aspirin and ibuprofen. These medicines can thin your blood. Do not take these medicines unless your health care provider tells you to take them.  Taking over-the-counter medicines, vitamins, herbs, and supplements. General instructions  Ask your health care provider what steps will be taken to help prevent infection. These may include washing skin with a germ-killing soap.  Plan to have someone take you home from the hospital or clinic. What happens during the procedure?   An IV will be inserted  into one of your veins.  You may be given one or more of the following: ? A medicine to help you relax (sedative). ? A medicine to numb the area (local anesthetic). ? A medicine to make you fall asleep (general anesthetic). This is rarely needed.  You will lie on your side with your knees bent.  The tube will: ? Have oil or gel put on it (be lubricated). ? Be inserted into your anus. ? Be gently eased through all parts of your large intestine.  Air will be sent into your colon to keep it open. This may cause some pressure or cramping.  Images will be taken with the camera and will appear on a screen.  A small tissue sample may be removed to be looked at under a microscope (biopsy). The tissue may be sent to a lab for testing if any signs of problems are found.  If small polyps are found, they may  be removed and checked for cancer cells.  When the procedure is finished, the tube will be removed. The procedure may vary among health care providers and hospitals. What happens after the procedure?  Your blood pressure, heart rate, breathing rate, and blood oxygen level will be monitored until you leave the hospital or clinic.  You may have a small amount of blood in your stool.  You may pass gas and have mild cramping or bloating in your abdomen. This is caused by the air that was used to open your colon during the exam.  Do not drive for 24 hours after the procedure.  It is up to you to get the results of your procedure. Ask your health care provider, or the department that is doing the procedure, when your results will be ready. Summary  A colonoscopy is a procedure to look at the entire large intestine.  Follow instructions from your health care provider about eating and drinking before the procedure.  If you were prescribed an oral bowel prep to clean out your colon, take it as told by your health care provider.  During the colonoscopy, a flexible tube with a camera on its end is inserted into the anus and then passed into the other parts of the large intestine. This information is not intended to replace advice given to you by your health care provider. Make sure you discuss any questions you have with your health care provider. Document Revised: 05/20/2019 Document Reviewed: 05/20/2019 Elsevier Patient Education  [place] Prevention in the Home, Adult Falls can cause injuries. They can happen to people of all ages. There are many things you can do to make your home safe and to help prevent falls. Ask for help when making these changes, if needed. What actions can I take to prevent falls? General Instructions  Use good lighting in all rooms. Replace any light bulbs that burn out.  Turn on the lights when you go into a dark area. Use night-lights.  Keep  items that you use often in easy-to-reach places. Lower the shelves around your home if necessary.  Set up your furniture so you have a clear path. Avoid moving your furniture around.  Do not have throw rugs and other things on the floor that can make you trip.  Avoid walking on wet floors.  If any of your floors are uneven, fix them.  Add color or contrast paint or tape to clearly mark and help you see: ? Any grab bars or handrails. ?  First and last steps of stairways. ? Where the edge of each step is.  If you use a stepladder: ? Make sure that it is fully opened. Do not climb a closed stepladder. ? Make sure that both sides of the stepladder are locked into place. ? Ask someone to hold the stepladder for you while you use it.  If there are any pets around you, be aware of where they are. What can I do in the bathroom?      Keep the floor dry. Clean up any water that spills onto the floor as soon as it happens.  Remove soap buildup in the tub or shower regularly.  Use non-skid mats or decals on the floor of the tub or shower.  Attach bath mats securely with double-sided, non-slip rug tape.  If you need to sit down in the shower, use a plastic, non-slip stool.  Install grab bars by the toilet and in the tub and shower. Do not use towel bars as grab bars. What can I do in the bedroom?  Make sure that you have a light by your bed that is easy to reach.  Do not use any sheets or blankets that are too big for your bed. They should not hang down onto the floor.  Have a firm chair that has side arms. You can use this for support while you get dressed. What can I do in the kitchen?  Clean up any spills right away.  If you need to reach something above you, use a strong step stool that has a grab bar.  Keep electrical cords out of the way.  Do not use floor polish or wax that makes floors slippery. If you must use wax, use non-skid floor wax. What can I do with my  stairs?  Do not leave any items on the stairs.  Make sure that you have a light switch at the top of the stairs and the bottom of the stairs. If you do not have them, ask someone to add them for you.  Make sure that there are handrails on both sides of the stairs, and use them. Fix handrails that are broken or loose. Make sure that handrails are as long as the stairways.  Install non-slip stair treads on all stairs in your home.  Avoid having throw rugs at the top or bottom of the stairs. If you do have throw rugs, attach them to the floor with carpet tape.  Choose a carpet that does not hide the edge of the steps on the stairway.  Check any carpeting to make sure that it is firmly attached to the stairs. Fix any carpet that is loose or worn. What can I do on the outside of my home?  Use bright outdoor lighting.  Regularly fix the edges of walkways and driveways and fix any cracks.  Remove anything that might make you trip as you walk through a door, such as a raised step or threshold.  Trim any bushes or trees on the path to your home.  Regularly check to see if handrails are loose or broken. Make sure that both sides of any steps have handrails.  Install guardrails along the edges of any raised decks and porches.  Clear walking paths of anything that might make someone trip, such as tools or rocks.  Have any leaves, snow, or ice cleared regularly.  Use sand or salt on walking paths during winter.  Clean up any spills in your garage right  away. This includes grease or oil spills. What other actions can I take?  Wear shoes that: ? Have a low heel. Do not wear high heels. ? Have rubber bottoms. ? Are comfortable and fit you well. ? Are closed at the toe. Do not wear open-toe sandals.  Use tools that help you move around (mobility aids) if they are needed. These include: ? Canes. ? Walkers. ? Scooters. ? Crutches.  Review your medicines with your doctor. Some medicines  can make you feel dizzy. This can increase your chance of falling. Ask your doctor what other things you can do to help prevent falls. Where to find more information  Centers for Disease Control and Prevention, [employer]: HealthcareCounselor.com.pt  [employer] on Aging: RingConnections.si Contact a doctor if:  You are afraid of falling at home.  You feel weak, drowsy, or dizzy at home.  You fall at home. Summary  There are many simple things that you can do to make your home safe and to help prevent falls.  Ways to make your home safe include removing tripping hazards and installing grab bars in the bathroom.  Ask for help when making these changes in your home. This information is not intended to replace advice given to you by your health care provider. Make sure you discuss any questions you have with your health care provider. Document Revised: 02/17/2019 Document Reviewed: 06/11/2017 Elsevier Patient Education  2020 Elsevier Inc.  Dementia Dementia is a condition that affects the way the brain functions. It often affects memory and thinking. Usually, dementia gets worse with time and cannot be reversed (progressive dementia). There are many types of dementia, including: Alzheimer's disease. This type is the most common. Vascular dementia. This type may happen as the result of a stroke. Lewy body dementia. This type may happen to people who have Parkinson's disease. Frontotemporal dementia. This type is caused by damage to nerve cells (neurons) in certain parts of the brain. Some people may be affected by more than one type of dementia. This is called mixed dementia. What are the causes? Dementia is caused by damage to cells in the brain. The area of the brain and the types of cells damaged determine the type of dementia. Usually, this damage is irreversible or cannot be undone. Some examples of irreversible causes include: Conditions that affect the blood vessels of the  brain, such as diabetes, heart disease, or blood vessel disease. Genetic mutations. In some cases, changes in the brain may be caused by another condition and can be reversed or slowed. Some examples of reversible causes include: Injury to the brain. Certain medicines. Infection, such as meningitis. Metabolic problems, such as vitamin B12 deficiency or thyroid disease. Pressure on the brain, such as from a tumor or blood clot. What are the signs or symptoms? Symptoms of dementia depend on the type of dementia. Common signs of dementia include problems with remembering, thinking, problem solving, decision making, and communicating. These signs develop slowly or get worse with time. This may include: Problems remembering things. Having trouble taking a bath or putting clothes on. Forgetting appointments. Forgetting to pay bills. Difficulty planning and preparing meals. Having trouble speaking. Getting lost easily. How is this diagnosed? This condition is diagnosed by a specialist (neurologist). It is diagnosed based on the history of your symptoms, your medical history, a physical exam, and tests. Tests may include: Tests to evaluate brain function, such as memory tests, cognitive tests, and other tests. Lab tests, such as blood or urine  tests. Imaging tests, such as a CT scan, a PET scan, or an MRI. Genetic testing. This may be done if other family members have a diagnosis of certain types of dementia. Your health care provider will talk with you and your family, friends, or caregivers about your history and symptoms. How is this treated?  Treatment for this condition depends on the cause of the dementia. Progressive dementias, such as Alzheimer's disease, cannot be cured, but there may be treatments that help to manage symptoms. Treatment might involve taking medicines that may help to: Control the dementia. Slow down the progression of the dementia. Manage symptoms. In some cases,  treating the cause of your dementia can improve symptoms, reverse symptoms, or slow down how quickly your dementia becomes worse. Your health care provider can direct you to support groups, organizations, and other health care providers who can help with decisions about your care. Follow these instructions at home: Medicines Take over-the-counter and prescription medicines only as told by your health care provider. Use a pill organizer or pill reminder to help you manage your medicines. Avoid taking medicines that can affect thinking, such as pain medicines or sleeping medicines. Lifestyle Make healthy lifestyle choices. Be physically active as told by your health care provider. Do not use any products that contain nicotine or tobacco, such as cigarettes, e-cigarettes, and chewing tobacco. If you need help quitting, ask your health care provider. Do not drink alcohol. Practice stress-management techniques when you get stressed. Spend time with other people. Make sure to get quality sleep. These tips can help you get a good night's rest: Avoid napping during the day. Keep your sleeping area dark and cool. Avoid exercising during the few hours before you go to bed. Avoid caffeine products in the evening. Eating and drinking Drink enough fluid to keep your urine pale yellow. Eat a healthy diet. General instructions  Work with your health care provider to determine what you need help with and what your safety needs are. Talk with your health care provider about whether it is safe for you to drive. If you were given a bracelet that identifies you as a person with memory loss or tracks your location, make sure to wear it at all times. Work with your family to make important decisions, such as advance directives, medical power of attorney, or a living will. Keep all follow-up visits as told by your health care provider. This is important. Where to find more information Alzheimer's Association:  LimitLaws.hu [employer] on Aging: CashCowGambling.be World Health Organization: https://castaneda-walker.com/ Contact a health care provider if: You have any new or worsening symptoms. You have problems with choking or swallowing. Get help right away if: You feel depressed or sad, or feel that you want to harm yourself. Your family members become concerned for your safety. If you ever feel like you may hurt yourself or others, or have thoughts about taking your own life, get help right away. You can go to your nearest emergency department or call: Your local emergency services (911 in the [place] A suicide crisis helpline, such as the National Suicide Prevention Lifeline at 873-633-9525. This is open 24 hours a day. Summary Dementia is a condition that affects the way the brain functions. Dementia often affects memory and thinking. Usually, dementia gets worse with time and cannot be reversed (progressive dementia). Treatment for this condition depends on the cause of the dementia. Work with your health care provider to determine what you need help with and what your  safety needs are. Your health care provider can direct you to support groups, organizations, and other health care providers who can help with decisions about your care. This information is not intended to replace advice given to you by your health care provider. Make sure you discuss any questions you have with your health care provider. Document Revised: 01/11/2019 Document Reviewed: 01/11/2019 Elsevier Patient Education  2020 [employer].

## 2019-12-17 MED ORDER — EAR PAIN RELIEF HOMEOPATHIC OT SOLN: 2.0000 [drp] | Freq: Four times a day (QID) | OTIC | 0 refills | Status: AC | PRN

## 2019-12-19 ENCOUNTER — Encounter: Payer: Self-pay | Admitting: Gastroenterology

## 2019-12-23 ENCOUNTER — Telehealth: Payer: Self-pay | Admitting: Family Medicine

## 2019-12-26 NOTE — Telephone Encounter (Signed)
Done

## 2019-12-27 ENCOUNTER — Telehealth: Payer: Self-pay | Admitting: Nurse Practitioner

## 2019-12-27 NOTE — Telephone Encounter (Signed)
Called and spoke with Aimee, gave verbal orders for physical therapy and Child psychotherapist. Thanks!

## 2019-12-30 ENCOUNTER — Ambulatory Visit: Payer: Medicare Other | Admitting: Nurse Practitioner

## 2020-01-02 ENCOUNTER — Emergency Department (HOSPITAL_COMMUNITY)
Admission: EM | Admit: 2020-01-02 | Discharge: 2020-01-02 | Disposition: A | Discharge status: Home / Self Care | Payer: Medicare Other | Attending: Emergency Medicine | Admitting: Emergency Medicine

## 2020-01-02 ENCOUNTER — Other Ambulatory Visit: Payer: Self-pay

## 2020-01-02 ENCOUNTER — Encounter (HOSPITAL_COMMUNITY): Payer: Self-pay | Admitting: Emergency Medicine

## 2020-01-02 DIAGNOSIS — Z5321 Procedure and treatment not carried out due to patient leaving prior to being seen by health care provider: Secondary | ICD-10-CM | POA: Diagnosis not present

## 2020-01-02 DIAGNOSIS — R519 Headache, unspecified: Secondary | ICD-10-CM | POA: Insufficient documentation

## 2020-01-02 NOTE — ED Triage Notes (Signed)
Pt c/o abd pains that has been going on for past 2 months. Pt eating has decreased. Pt has home nurse, seen psychiatrist, neurologist, and PCP that cant find anything wrong. Had an appt with GI but was cancelled until March 10. Patient was bent over in pain today. Also had headaches.  Pt daughter reports that patient had MRI but patient wasn't to complete the scan due to her dementia.

## 2020-01-08 NOTE — Progress Notes (Deleted)
01/08/2020 Kellsey Sansone [id number] [birth year]   CHIEF COMPLAINT:   HISTORY OF PRESENT ILLNESS:  Suvi Archuletta is a 74 year old female with a past medical of kidney stones s/p cystoscopy with stent placement 12/07/2018.   She presented to [facility] ER on 12/03/2019 with complaints of abdominal pain x 3 days. Labs were normal and an abdominal/pelvic CT showed mild stable cardiomegaly and colonic diverticulosis. She was discharged home. She returned to the  ED on 12/06/2019 with epigastric pain. Labs showed hypokalemia with K+ 3.1. Head CT was  unremarkable.   [facility] 01/02/2020  She lives with her daughter.   CBC Latest Ref Rng & Units 12/06/2019 12/03/2019 01/31/2019  WBC 4.0 - 10.5 K/uL 5.7 6.1 4.9  Hemoglobin 12.0 - 15.0 g/dL 13.8 13.2 11.4(L)  Hematocrit 36.0 - 46.0 % 40.4 40.1 36.2  Platelets 150 - 400 K/uL 258 215 218   CMP Latest Ref Rng & Units 12/06/2019 12/03/2019 01/31/2019  Glucose 70 - 99 mg/dL 127(H) 101(H) 113(H)  BUN 8 - 23 mg/dL 10 6(L) 7(L)  Creatinine 0.44 - 1.00 mg/dL 0.94 0.73 0.96  Sodium 135 - 145 mmol/L 137 139 139  Potassium 3.5 - 5.1 mmol/L 3.1(L) 3.6 3.7  Chloride 98 - 111 mmol/L 104 106 107  CO2 22 - 32 mmol/L 20(L) 22 22  Calcium 8.9 - 10.3 mg/dL 9.5 9.4 9.1  Total Protein 6.5 - 8.1 g/dL 7.3 7.1 7.4  Total Bilirubin 0.3 - 1.2 mg/dL 1.4(H) 1.4(H) 0.5  Alkaline Phos 38 - 126 U/L 68 68 79  AST 15 - 41 U/L 15 14(L) 16  ALT 0 - 44 U/L 8 7 8    Abdominal/pelvic CT wo contrast 12/03/2019:  1. Mild, stable cardiomegaly. 2. Colonic diverticulosis.  Past Medical History:  Diagnosis Date  . 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 CYSTOSCOPY WITH STENT PLACEMENT Right 12/07/2018   Procedure: CYSTOSCOPY WITH STENT PLACEMENT;  Surgeon: Ardis Hughs, MD;  Location: [facility];  Service: Urology;  Laterality: Right;  . CYSTOSCOPY/URETEROSCOPY/HOLMIUM LASER/STENT PLACEMENT Right  12/31/2018   Procedure: CYSTOSCOPY RIGHT URETEROSCOPY/HOLMIUM LASER RIGHT Thyra Breed;  Surgeon: Ardis Hughs, MD;  Location: [facility];  Service: Urology;  Laterality: Right;  . JOINT REPLACEMENT  5 YRS AGO   KNEE REPLACEMENT    reports that she has quit smoking. She has never used smokeless tobacco. She reports that she does not drink alcohol or use drugs. family history is not on file. No Known Allergies    Outpatient Encounter Medications as of 01/09/2020  Medication Sig  . amoxicillin-clavulanate (AUGMENTIN) 875-125 MG tablet Take 1 tablet by mouth 2 (two) times daily.  . Homeopathic Products (EAR PAIN RELIEF HOMEOPATHIC) SOLN Place 2 drops in ear(s) 4 (four) times daily as needed.  . potassium chloride (KLOR-CON) 10 MEQ tablet Take 3 tablets (30 mEq total) by mouth daily for 3 days.  . traMADol (ULTRAM) 50 MG tablet Take 1-2 tablets (50-100 mg total) by mouth every 6 (six) hours as needed for moderate pain.   No facility-administered encounter medications on file as of 01/09/2020.     REVIEW OF SYSTEMS: All other systems reviewed and negative except where noted in the History of Present Illness.   PHYSICAL EXAM: There were no vitals taken for this visit. General: Well developed ... in no acute distress. Head: Normocephalic and atraumatic. Eyes:  Sclerae non-icteric, conjunctive  pink. Ears: Normal auditory acuity. Mouth: Dentition intact. No ulcers or lesions.  Neck: Supple, no lymphadenopathy or thyromegaly.  Lungs: Clear bilaterally to auscultation without wheezes, crackles or rhonchi. Heart: Regular rate and rhythm. No murmur, rub or gallop appreciated.  Abdomen: Soft, nontender, non distended. No masses. No hepatosplenomegaly. Normoactive bowel sounds x 4 quadrants.  Rectal:  Musculoskeletal: Symmetrical with no gross deformities. Skin: Warm and dry. No rash or lesions on visible extremities. Extremities: No edema. Neurological: Alert oriented x  4, no focal deficits.  Psychological:  Alert and cooperative. Normal mood and affect.  ASSESSMENT AND PLAN:  86. 74 year old female with    CC:  Kallie Locks, FNP

## 2020-01-09 ENCOUNTER — Ambulatory Visit: Payer: Medicare Other | Admitting: Nurse Practitioner

## 2020-01-09 HISTORY — PX: CATARACT EXTRACTION: SUR2

## 2020-01-17 ENCOUNTER — Ambulatory Visit: Payer: Medicare Other | Admitting: Gastroenterology

## 2020-01-20 ENCOUNTER — Telehealth: Payer: Self-pay | Admitting: Family Medicine

## 2020-01-20 ENCOUNTER — Ambulatory Visit: Payer: Medicare Other | Admitting: Nurse Practitioner

## 2020-01-23 NOTE — Telephone Encounter (Signed)
Sent to NP [place] 

## 2020-01-27 ENCOUNTER — Ambulatory Visit: Payer: Medicare Other | Admitting: Nurse Practitioner

## 2020-02-17 ENCOUNTER — Ambulatory Visit (INDEPENDENT_AMBULATORY_CARE_PROVIDER_SITE_OTHER): Payer: Medicare Other | Admitting: Gastroenterology

## 2020-02-17 ENCOUNTER — Encounter: Payer: Self-pay | Admitting: Gastroenterology

## 2020-02-17 VITALS — BP 90/60 | HR 104 | Temp 98.9°F | Ht 68.0 in | Wt 142.1 lb

## 2020-02-17 DIAGNOSIS — R634 Abnormal weight loss: Secondary | ICD-10-CM | POA: Insufficient documentation

## 2020-02-17 DIAGNOSIS — R630 Anorexia: Secondary | ICD-10-CM | POA: Diagnosis not present

## 2020-02-17 DIAGNOSIS — K5900 Constipation, unspecified: Secondary | ICD-10-CM

## 2020-02-17 DIAGNOSIS — E7849 Other hyperlipidemia: Secondary | ICD-10-CM

## 2020-02-17 DIAGNOSIS — R1084 Generalized abdominal pain: Secondary | ICD-10-CM | POA: Diagnosis not present

## 2020-02-17 DIAGNOSIS — R142 Eructation: Secondary | ICD-10-CM | POA: Insufficient documentation

## 2020-02-17 NOTE — Progress Notes (Signed)
02/17/2020 Monica Erickson [id number] Apr 01, 1946   HISTORY OF PRESENT ILLNESS: This is a 74 year old female who is new to our office.  She is here today with her daughter, Monica Erickson.  Monica Erickson provides all of the information as the patient has advanced dementia and is unable to provide history.  Her daughter tells me that her mother often complains of abdominal pain, constantly holding her abdomen.  Has poor appetite and refuses to eat.  As a result she has lost about 20 pounds over the past 6 months.  They report that she says that she is hungry, but then she will not eat.  They report a lot of belching.  She is constipated and frequently passes small rabbit pellet type stools.  No blood in her stool to their knowledge.  She is unaware if her mother has ever had EGD and colonoscopy.  Her mother just moved to [place] to live with them about 2 years ago and was previously in [place].  Kylie has had 2 CT scans over the past year, the last in January of this year that have been relatively unremarkable.  Labs look good fairly good as well.  Her daughter expresses that she just wants to be sure that she is evaluated and has no other physical cause of her symptoms found.  She says that if everything turns out okay then she is willing to accept the fact that her complaints of abdominal pain, unwillingness to eat may likely be related to her anxiety and depression as well as her dementia.  She just does not want to miss something if there is truly something wrong that is causing her pain, etc.   Past Medical History:  Diagnosis Date  . Chronic headaches   . 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 Depression   . Gastric ulcer   . Kidney stones   . Night terrors   . Sleep apnea    Past Surgical History:  Procedure Laterality Date  . CYSTOSCOPY WITH STENT PLACEMENT Right 12/07/2018   Procedure: CYSTOSCOPY WITH STENT PLACEMENT;  Surgeon: Crist Fat, MD;  Location: [facility];  Service: Urology;   Laterality: Right;  . CYSTOSCOPY/URETEROSCOPY/HOLMIUM LASER/STENT PLACEMENT Right 12/31/2018   Procedure: CYSTOSCOPY RIGHT URETEROSCOPY/HOLMIUM LASER RIGHT Lucretia Roers;  Surgeon: Crist Fat, MD;  Location: [facility];  Service: Urology;  Laterality: Right;  . REPLACEMENT TOTAL KNEE Bilateral 5 YRS AGO    reports that she has quit smoking. She has never used smokeless tobacco. She reports current alcohol use. She reports that she does not use drugs. family history includes Diabetes in her mother; Hypertension in her father. No Known Allergies    Outpatient Encounter Medications as of 02/17/2020  Medication Sig  . LORazepam (ATIVAN PO) Take 1 tablet by mouth at bedtime.  . memantine (NAMENDA) 5 MG tablet Take 5 mg by mouth at bedtime.  . mirtazapine (REMERON) 15 MG tablet Take 15 mg by mouth at bedtime.  . [DISCONTINUED] amoxicillin-clavulanate (AUGMENTIN) 875-125 MG tablet Take 1 tablet by mouth 2 (two) times daily.  . [DISCONTINUED] potassium chloride (KLOR-CON) 10 MEQ tablet Take 3 tablets (30 mEq total) by mouth daily for 3 days.  . [DISCONTINUED] traMADol (ULTRAM) 50 MG tablet Take 1-2 tablets (50-100 mg total) by mouth every 6 (six) hours as needed for moderate pain.   No facility-administered encounter medications on file as of 02/17/2020.     REVIEW OF SYSTEMS  : All  other systems reviewed and negative except where noted in the History of Present Illness.   PHYSICAL EXAM: BP 90/60 (BP Location: Left Arm, Patient Position: Sitting, Cuff Size: Normal)   Pulse (!) 104   Temp 98.9 F (37.2 C)   Ht 5\' 8"  (1.727 m) Comment: height measured without shoes  Wt 142 lb 2 oz (64.5 kg)   BMI 21.61 kg/m  General: Well developed AA female in no acute distress Head: Normocephalic and atraumatic Eyes:  Sclerae anicteric, conjunctiva pink. Ears: Normal auditory acuity Lungs: Clear throughout to auscultation; no increased WOB. Heart: Slightly tachy.  No  M/R/G. Abdomen: Soft, non-distended.  BS present.  Non-tender. Rectal:  Will be done at the time of colonoscopy. Musculoskeletal: Symmetrical with no gross deformities  Skin: No lesions on visible extremities Extremities: No edema   ASSESSMENT AND PLAN: *74 year old female with advanced dementia.  She frequently complains of abdominal pain.  Has poor appetite and refuses to eat.  Has lost weight as a result of this.  Has constipation with hard rabbit pellet-like stools.  Alto of belching.  CT scan unremarkable.  Daughter is unaware if she is ever had endoscopic evaluation previously.  Her daughter just wants to be sure that she has been completely evaluated for any other physical cause of her complaints.  She says that if everything turns out fine then she will except the fact that it is likely all related to her anxiety/depression and her dementia.  We will plan for both EGD and colonoscopy for evaluation with Dr. Ardis Hughs.  Daughter thinks that they will be able to get her to complete the bowel prep, but prefers something in pill form as they think that it will be easier to get her to take that over drinking the liquid form.  We will give Sutab.  The risks, benefits, and alternatives to EGD and colonoscopy were discussed with the patient and her daughter and they consent to proceed.  I have asked her to begin MiraLAX 1 capful daily mixed in 8 to 10 ounces of liquid.  We will try FD guard for the belching.  Samples given.  Will check TSH.  She was recently started on Remeron 15 mg at bedtime to help with sleep and appetite.   CC:  Azzie Glatter, FNP

## 2020-02-17 NOTE — Patient Instructions (Addendum)
Your provider has requested that you go to the basement level for lab work before leaving today. Press "B" on the elevator. The lab is located at the first door on the left as you exit the elevator.   Sample given for Sutab for colonoscopy prep

## 2020-02-19 NOTE — Progress Notes (Signed)
I agree with the above note, plan 

## 2020-03-10 DIAGNOSIS — A048 Other specified bacterial intestinal infections: Secondary | ICD-10-CM

## 2020-03-10 HISTORY — DX: Other specified bacterial intestinal infections: A04.8

## 2020-03-20 ENCOUNTER — Other Ambulatory Visit: Payer: Self-pay | Admitting: Gastroenterology

## 2020-03-20 ENCOUNTER — Telehealth: Payer: Self-pay | Admitting: General Surgery

## 2020-03-20 ENCOUNTER — Ambulatory Visit (INDEPENDENT_AMBULATORY_CARE_PROVIDER_SITE_OTHER): Payer: Medicare Other

## 2020-03-20 DIAGNOSIS — Z1159 Encounter for screening for other viral diseases: Secondary | ICD-10-CM

## 2020-03-20 NOTE — Telephone Encounter (Signed)
The patients daughter contacted the office stating that her mother is now unable to swallow tablets and will not be able to use sutab. Requesting another prep and unable to pay for Suprep out of pocket. Changed prep to [place] and they will be in to pick it up within the hour.

## 2020-03-21 ENCOUNTER — Other Ambulatory Visit: Payer: Self-pay

## 2020-03-21 ENCOUNTER — Ambulatory Visit: Payer: Medicare Other | Admitting: Gastroenterology

## 2020-03-21 ENCOUNTER — Encounter: Payer: Self-pay | Admitting: Gastroenterology

## 2020-03-21 VITALS — BP 114/61 | HR 83 | Temp 97.5°F | Ht 68.0 in | Wt 142.0 lb

## 2020-03-21 DIAGNOSIS — R1084 Generalized abdominal pain: Secondary | ICD-10-CM

## 2020-03-21 DIAGNOSIS — R630 Anorexia: Secondary | ICD-10-CM

## 2020-03-21 LAB — SARS CORONAVIRUS 2 (TAT 6-24 HRS): SARS Coronavirus 2: NEGATIVE

## 2020-03-21 MED ORDER — SODIUM CHLORIDE 0.9 % IV SOLN: 500.0000 mL | INTRAVENOUS | Status: DC

## 2020-03-21 NOTE — Progress Notes (Signed)
Unable to obtain IV after 5 attempts.   C Wall, CRNA  R wrist, R a/c right and left feet,  Left wrist P. Hylton. Patient scared and had difficulty tolerating due to dementia.    Patient's procedure canceled and rescheduled for tomorrow at hospital. Patient instructions reviewed with patient's daughter, [name].  Patient to drink 118 grams Miralax in 32 ounces of fluid, remaining on clear liquids tonight.

## 2020-03-22 ENCOUNTER — Ambulatory Visit (HOSPITAL_COMMUNITY)
Admission: RE | Admit: 2020-03-22 | Discharge: 2020-03-22 | Disposition: A | Discharge status: Home / Self Care | Payer: Medicare Other | Attending: Gastroenterology | Admitting: Gastroenterology

## 2020-03-22 ENCOUNTER — Ambulatory Visit (HOSPITAL_COMMUNITY): Payer: Medicare Other | Admitting: Anesthesiology

## 2020-03-22 ENCOUNTER — Encounter (HOSPITAL_COMMUNITY): Payer: Self-pay | Admitting: Gastroenterology

## 2020-03-22 ENCOUNTER — Other Ambulatory Visit: Payer: Self-pay

## 2020-03-22 ENCOUNTER — Encounter (HOSPITAL_COMMUNITY)
Admission: RE | Disposition: A | Discharge status: Home / Self Care | Payer: Self-pay | Source: Home / Self Care | Attending: Gastroenterology

## 2020-03-22 DIAGNOSIS — R1084 Generalized abdominal pain: Secondary | ICD-10-CM

## 2020-03-22 DIAGNOSIS — Z87891 Personal history of nicotine dependence: Secondary | ICD-10-CM | POA: Insufficient documentation

## 2020-03-22 DIAGNOSIS — G309 Alzheimer's disease, unspecified: Secondary | ICD-10-CM | POA: Diagnosis not present

## 2020-03-22 DIAGNOSIS — R634 Abnormal weight loss: Secondary | ICD-10-CM | POA: Insufficient documentation

## 2020-03-22 DIAGNOSIS — K2980 Duodenitis without bleeding: Secondary | ICD-10-CM | POA: Diagnosis not present

## 2020-03-22 DIAGNOSIS — K2950 Unspecified chronic gastritis without bleeding: Secondary | ICD-10-CM | POA: Diagnosis not present

## 2020-03-22 DIAGNOSIS — K269 Duodenal ulcer, unspecified as acute or chronic, without hemorrhage or perforation: Secondary | ICD-10-CM | POA: Diagnosis not present

## 2020-03-22 DIAGNOSIS — F0280 Dementia in other diseases classified elsewhere without behavioral disturbance: Secondary | ICD-10-CM | POA: Insufficient documentation

## 2020-03-22 DIAGNOSIS — Z96653 Presence of artificial knee joint, bilateral: Secondary | ICD-10-CM | POA: Diagnosis not present

## 2020-03-22 DIAGNOSIS — R630 Anorexia: Secondary | ICD-10-CM | POA: Diagnosis not present

## 2020-03-22 DIAGNOSIS — B9681 Helicobacter pylori [H. pylori] as the cause of diseases classified elsewhere: Secondary | ICD-10-CM | POA: Insufficient documentation

## 2020-03-22 DIAGNOSIS — K2970 Gastritis, unspecified, without bleeding: Secondary | ICD-10-CM | POA: Diagnosis not present

## 2020-03-22 DIAGNOSIS — G4730 Sleep apnea, unspecified: Secondary | ICD-10-CM | POA: Diagnosis not present

## 2020-03-22 DIAGNOSIS — K5730 Diverticulosis of large intestine without perforation or abscess without bleeding: Secondary | ICD-10-CM | POA: Insufficient documentation

## 2020-03-22 HISTORY — PX: BIOPSY: SHX5522

## 2020-03-22 HISTORY — PX: ESOPHAGOGASTRODUODENOSCOPY (EGD) WITH PROPOFOL: SHX5813

## 2020-03-22 HISTORY — PX: COLONOSCOPY WITH PROPOFOL: SHX5780

## 2020-03-22 SURGERY — ESOPHAGOGASTRODUODENOSCOPY (EGD) WITH PROPOFOL
Anesthesia: Monitor Anesthesia Care

## 2020-03-22 MED ORDER — PHENYLEPHRINE 40 MCG/ML (10ML) SYRINGE FOR IV PUSH (FOR BLOOD PRESSURE SUPPORT)
PREFILLED_SYRINGE | INTRAVENOUS | Status: DC | PRN
  Administered 2020-03-22 (×2): 80 ug via INTRAVENOUS

## 2020-03-22 MED ORDER — PROPOFOL 10 MG/ML IV BOLUS
INTRAVENOUS | Status: DC | PRN
  Administered 2020-03-22: 30 mg via INTRAVENOUS
  Administered 2020-03-22: 10 mg via INTRAVENOUS
  Administered 2020-03-22: 20 mg via INTRAVENOUS
  Administered 2020-03-22: 10 mg via INTRAVENOUS
  Administered 2020-03-22: 30 mg via INTRAVENOUS

## 2020-03-22 MED ORDER — PROPOFOL 500 MG/50ML IV EMUL
INTRAVENOUS | Status: DC | PRN
  Administered 2020-03-22: 75 ug/kg/min via INTRAVENOUS

## 2020-03-22 MED ORDER — PROPOFOL 500 MG/50ML IV EMUL
INTRAVENOUS | Status: AC
  Filled 2020-03-22: qty 50

## 2020-03-22 MED ORDER — LACTATED RINGERS IV SOLN
INTRAVENOUS | Status: DC | PRN
Start: 2020-03-22 — End: 2020-03-22

## 2020-03-22 MED ORDER — LIDOCAINE 2% (20 MG/ML) 5 ML SYRINGE
INTRAMUSCULAR | Status: DC | PRN
  Administered 2020-03-22: 50 mg via INTRAVENOUS

## 2020-03-22 MED ORDER — OMEPRAZOLE 40 MG PO CPDR: 40.0000 mg | DELAYED_RELEASE_CAPSULE | Freq: Every day | ORAL | 11 refills | Status: DC

## 2020-03-22 MED ORDER — SODIUM CHLORIDE 0.9 % IV SOLN: INTRAVENOUS | Status: DC

## 2020-03-22 MED ORDER — ONDANSETRON HCL 4 MG/2ML IJ SOLN
INTRAMUSCULAR | Status: DC | PRN
  Administered 2020-03-22: 4 mg via INTRAVENOUS

## 2020-03-22 SURGICAL SUPPLY — 25 items

## 2020-03-22 NOTE — H&P (Signed)
HPI: This is a demented 74 yo woman with anorexia, generalzed abdominal pains.  Attempted colonoscoyp and EGD yeasterday at [facility] however unable to get IV access.   ROS: complete GI ROS as described in HPI, all other review negative.  Constitutional:  No unintentional weight loss   Past Medical History:  Diagnosis Date  . Chronic headaches   . 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 Depression   . Difficult intravenous access 2021   needs anesthesia , IV team, ultrasound for IV   . Gastric ulcer   . Kidney stones   . Night terrors   . Sleep apnea    no cpap    Past Surgical History:  Procedure Laterality Date  . CYSTOSCOPY WITH STENT PLACEMENT Right 12/07/2018   Procedure: CYSTOSCOPY WITH STENT PLACEMENT;  Surgeon: Ardis Hughs, MD;  Location: [facility];  Service: Urology;  Laterality: Right;  . CYSTOSCOPY/URETEROSCOPY/HOLMIUM LASER/STENT PLACEMENT Right 12/31/2018   Procedure: CYSTOSCOPY RIGHT URETEROSCOPY/HOLMIUM LASER RIGHT Thyra Breed;  Surgeon: Ardis Hughs, MD;  Location: [facility];  Service: Urology;  Laterality: Right;  . REPLACEMENT TOTAL KNEE Bilateral 5 YRS AGO    No current facility-administered medications for this encounter.    Allergies as of 03/21/2020  . (No Known Allergies)    Family History  Problem Relation Age of Onset  . Diabetes Mother   . Hypertension Father   . Colon cancer Neg Hx   . Esophageal cancer Neg Hx   . Stomach cancer Neg Hx   . Rectal cancer Neg Hx     Social History   Socioeconomic History  . Marital status: Single    Spouse name: Not on file  . Number of children: 3  . Years of education: Not on file  . Highest education level: Not on file  Occupational History  . Occupation: retired  Tobacco Use  . Smoking status: Former Research scientist (life sciences)  . Smokeless tobacco: Never Used  Substance and Sexual Activity  . Alcohol use: Yes    Comment: occasional wine  . Drug use: Never  . Sexual activity: Not  Currently  Other Topics Concern  . Not on file  Social History Narrative  . Not on file   Social Determinants of Health   Financial Resource Strain:   . Difficulty of Paying Living Expenses:   Food Insecurity:   . Worried About Charity fundraiser in the Last Year:   . Arboriculturist in the Last Year:   Transportation Needs:   . Film/video editor (Medical):   Marland Kitchen Lack of Transportation (Non-Medical):   Physical Activity:   . Days of Exercise per Week:   . Minutes of Exercise per Session:   Stress:   . Feeling of Stress :   Social Connections:   . Frequency of Communication with Friends and Family:   . Frequency of Social Gatherings with Friends and Family:   . Attends Religious Services:   . Active Member of Clubs or Organizations:   . Attends Archivist Meetings:   Marland Kitchen Marital Status:   Intimate Partner Violence:   . Fear of Current or Ex-Partner:   . Emotionally Abused:   Marland Kitchen Physically Abused:   . Sexually Abused:      Physical Exam: BP 135/82   Pulse 68   Temp 98.8 F (37.1 C) (Oral)   Resp (!) 23   SpO2 100%  Constitutional: generally well-appearing Psychiatric: alert and oriented x3  Abdomen: soft, nontender, nondistended, no obvious ascites, no peritoneal signs, normal bowel sounds No peripheral edema noted in lower extremities  Assessment and plan: 74 y.o. female with advanced dementia, anorexia, generalized abdominal pains  For colonoscoyp and EGD today  Please see the "Patient Instructions" section for addition details about the plan.  Rob Bunting, MD [facility] 03/22/2020, 11:43 AM

## 2020-03-22 NOTE — Anesthesia Preprocedure Evaluation (Signed)
Anesthesia Evaluation  Patient identified by MRN, date of birth, ID band Patient awake    Reviewed: Allergy & Precautions, NPO status , Patient's Chart, lab work & pertinent test results  Airway Mallampati: I  TM Distance: >3 FB Neck ROM: Full    Dental   Pulmonary former smoker,    Pulmonary exam normal        Cardiovascular Normal cardiovascular exam     Neuro/Psych Depression Dementia    GI/Hepatic   Endo/Other    Renal/GU      Musculoskeletal   Abdominal   Peds  Hematology   Anesthesia Other Findings   Reproductive/Obstetrics                             Anesthesia Physical Anesthesia Plan  ASA: III  Anesthesia Plan: MAC   Post-op Pain Management:    Induction: Intravenous  PONV Risk Score and Plan: 2 and Ondansetron and Treatment may vary due to age or medical condition  Airway Management Planned: Nasal Cannula  Additional Equipment:   Intra-op Plan:   Post-operative Plan:   Informed Consent: I have reviewed the patients History and Physical, chart, labs and discussed the procedure including the risks, benefits and alternatives for the proposed anesthesia with the patient or authorized representative who has indicated his/her understanding and acceptance.       Plan Discussed with: CRNA and Surgeon  Anesthesia Plan Comments:         Anesthesia Quick Evaluation

## 2020-03-22 NOTE — Op Note (Addendum)
[facility] Patient Name: Monica Erickson Procedure Date: 03/22/2020 MRN: [id number] Attending MD: Rachael Fee , MD Date of Birth: 04/08/1946 CSN: [id number] Age: 74 Admit Type: Outpatient Procedure:                Upper GI endoscopy Indications:              Generalized abdominal pain, anorexia, weight loss Providers:                Rachael Fee, MD, Dwain Sarna, RN, Kandice Robinsons, Technician Referring MD:              Medicines:                Monitored Anesthesia Care Complications:            No immediate complications. Estimated Blood Loss:     Estimated blood loss: none. Procedure:                Pre-Anesthesia Assessment:                           - Prior to the procedure, a History and Physical                            was performed, and patient medications and                            allergies were reviewed. The patient's tolerance of                            previous anesthesia was also reviewed. The risks                            and benefits of the procedure and the sedation                            options and risks were discussed with the patient.                            All questions were answered, and informed consent                            was obtained. Prior Anticoagulants: The patient has                            taken no previous anticoagulant or antiplatelet                            agents. ASA Grade Assessment: III - A patient with                            severe systemic disease. After reviewing the risks  and benefits, the patient was deemed in                            satisfactory condition to undergo the procedure.                           After obtaining informed consent, the endoscope was                            passed under direct vision. Throughout the                            procedure, the patient's blood pressure, pulse, and         oxygen saturations were monitored continuously. The                            GIF-H190 ([id number] Olympus gastroscope was                            introduced through the mouth, and advanced to the                            second part of duodenum. The upper GI endoscopy was                            accomplished without difficulty. The patient                            tolerated the procedure well. Scope In: Scope Out: Findings:      Mild, non-specific gastritis in antrum and body. Biopsied.      Thin, benign appearing linear ulcer in the distal duodenal bulb       associated with slight anatomic deformity and narrowing of the lumen. I       was able to easily advance the adult gastroscope more deeply into the       duodenum however.      The exam was otherwise without abnormality. Impression:               - Mild, non-specific gastritis in antrum and body.                            Biopsied.                           - Thin, benign appearing linear ulcer in the distal                            duodenal bulb associated with slight anatomic                            deformity and narrowing of the lumen. I was able to                            easily advance the adult gastroscope more deeply  into the duodenum however. This ulcer probably                            accounts for her anorexia, weight loss.                           - The examination was otherwise normal. Moderate Sedation:      Not Applicable - Patient had care per Anesthesia. Recommendation:           - Patient has a contact number available for                            emergencies. The signs and symptoms of potential                            delayed complications were discussed with the                            patient. Return to normal activities tomorrow.                            Written discharge instructions were provided to the                            patient.                            - Resume previous diet.                           - Continue present medications. New medicine called                            in today: omeprazole 40mg  pills one pill once daily                            shortly before breakfast meal.                           - Await pathology results. Procedure Code(s):        --- Professional ---                           [phone], Esophagogastroduodenoscopy, flexible,                            transoral; with biopsy, single or multiple Diagnosis Code(s):        --- Professional ---                           K29.70, Gastritis, unspecified, without bleeding                           R10.84, Generalized abdominal pain CPT copyright 2019 American Medical Association. All rights reserved. The codes documented in this report are preliminary and upon coder review may  be  revised to meet current compliance requirements. Rachael Fee, MD 03/22/2020 1:18:57 PM This report has been signed electronically. Number of Addenda: 0

## 2020-03-22 NOTE — Discharge Instructions (Signed)
YOU HAD AN ENDOSCOPIC PROCEDURE TODAY: Refer to the procedure report and other information in the discharge instructions given to you for any specific questions about what was found during the examination. If this information does not answer your questions, please call [place] office at [phone] to clarify.  ° °YOU SHOULD EXPECT: Some feelings of bloating in the abdomen. Passage of more gas than usual. Walking can help get rid of the air that was put into your GI tract during the procedure and reduce the bloating. If you had a lower endoscopy (such as a colonoscopy or flexible sigmoidoscopy) you may notice spotting of blood in your stool or on the toilet paper. Some abdominal soreness may be present for a day or two, also. ° °DIET: Your first meal following the procedure should be a light meal and then it is ok to progress to your normal diet. A half-sandwich or bowl of soup is an example of a good first meal. Heavy or fried foods are harder to digest and may make you feel nauseous or bloated. Drink plenty of fluids but you should avoid alcoholic beverages for 24 hours. If you had a esophageal dilation, please see attached instructions for diet.   ° °ACTIVITY: Your care partner should take you home directly after the procedure. You should plan to take it easy, moving slowly for the rest of the day. You can resume normal activity the day after the procedure however YOU SHOULD NOT DRIVE, use power tools, machinery or perform tasks that involve climbing or major physical exertion for 24 hours (because of the sedation medicines used during the test).  ° °SYMPTOMS TO REPORT IMMEDIATELY: °A gastroenterologist can be reached at any hour. Please call [phone]  for any of the following symptoms:  °Following lower endoscopy (colonoscopy, flexible sigmoidoscopy) °Excessive amounts of blood in the stool  °Significant tenderness, worsening of abdominal pains  °Swelling of the abdomen that is new, acute  °Fever of 100° or  higher  °Following upper endoscopy (EGD, EUS, ERCP, esophageal dilation) °Vomiting of blood or coffee ground material  °New, significant abdominal pain  °New, significant chest pain or pain under the shoulder blades  °Painful or persistently difficult swallowing  °New shortness of breath  °Black, tarry-looking or red, bloody stools ° °FOLLOW UP:  °If any biopsies were taken you will be contacted by phone or by letter within the next 1-3 weeks. Call [phone]  if you have not heard about the biopsies in 3 weeks.  °Please also call with any specific questions about appointments or follow up tests. ° °

## 2020-03-22 NOTE — Op Note (Addendum)
[facility] Patient Name: Monica Erickson Procedure Date: 03/22/2020 MRN: [id number] Attending MD: Rachael Fee , MD Date of Birth: [birth year] CSN: [id number] Age: 74 Admit Type: Outpatient Procedure:                Colonoscopy Indications:              Generalized abdominal pain, dementia, weight loss Providers:                Rachael Fee, MD, Dwain Sarna, RN, Kandice Robinsons, Technician Referring MD:              Medicines:                Monitored Anesthesia Care Complications:            No immediate complications. Estimated blood loss:                            None. Estimated Blood Loss:     Estimated blood loss: none. Procedure:                Pre-Anesthesia Assessment:                           - Prior to the procedure, a History and Physical                            was performed, and patient medications and                            allergies were reviewed. The patient's tolerance of                            previous anesthesia was also reviewed. The risks                            and benefits of the procedure and the sedation                            options and risks were discussed with the patient.                            All questions were answered, and informed consent                            was obtained. Prior Anticoagulants: The patient has                            taken no previous anticoagulant or antiplatelet                            agents. ASA Grade Assessment: III - A patient with  severe systemic disease. After reviewing the risks                            and benefits, the patient was deemed in                            satisfactory condition to undergo the procedure.                           After obtaining informed consent, the colonoscope                            was passed under direct vision. Throughout the                            procedure, the  patient's blood pressure, pulse, and                            oxygen saturations were monitored continuously. The                            CF-HQ190L ([id number]) Olympus colonoscope was                            introduced through the anus and advanced to the the                            cecum, identified by appendiceal orifice and                            ileocecal valve. The colonoscopy was performed                            without difficulty. The patient tolerated the                            procedure well. The quality of the bowel                            preparation was poor. The ileocecal valve,                            appendiceal orifice, and rectum were photographed. Scope In: 12:44:35 PM Scope Out: 12:58:15 PM Scope Withdrawal Time: 0 hours 5 minutes 52 seconds  Total Procedure Duration: 0 hours 13 minutes 40 seconds  Findings:      Poor prep; solid and liquid stool throughout colon especially right side.      Multiple small and large-mouthed diverticula were found in the entire       colon.      The exam was otherwise without abnormality on direct and retroflexion       views. Impression:               - Preparation of the colon was poor. Clearly there  were no large or obstructing tumors however small                            but significant lesions may have been not visible                            due to the prep.                           - Diverticulosis in the entire examined colon.                           - The examination was otherwise normal on direct                            and retroflexion views. Moderate Sedation:      Not Applicable - Patient had care per Anesthesia. Recommendation:           - EGD now. Procedure Code(s):        --- Professional ---                           [id number], Colonoscopy, flexible; diagnostic, including                            collection of specimen(s) by brushing or washing,                             when performed (separate procedure) Diagnosis Code(s):        --- Professional ---                           R10.84, Generalized abdominal pain                           K57.30, Diverticulosis of large intestine without                            perforation or abscess without bleeding CPT copyright 2019 American Medical Association. All rights reserved. The codes documented in this report are preliminary and upon coder review may  be revised to meet current compliance requirements. Rachael Fee, MD 03/22/2020 1:01:04 PM This report has been signed electronically. Number of Addenda: 0

## 2020-03-22 NOTE — Transfer of Care (Signed)
Immediate Anesthesia Transfer of Care Note  Patient: Monica Erickson  Procedure(s) Performed: ESOPHAGOGASTRODUODENOSCOPY (EGD) WITH PROPOFOL (N/A ) COLONOSCOPY WITH PROPOFOL (N/A ) BIOPSY  Patient Location: PACU and Endoscopy Unit  Anesthesia Type:MAC  Level of Consciousness: awake and alert   Airway & Oxygen Therapy: Patient Spontanous Breathing and Patient connected to nasal cannula oxygen  Post-op Assessment: Report given to RN and Post -op Vital signs reviewed and stable  Post vital signs: Reviewed and stable  Last Vitals:  Vitals Value Taken Time  BP 111/63 03/22/20 1320  Temp    Pulse 73 03/22/20 1322  Resp 23 03/22/20 1322  SpO2 95 % 03/22/20 1322  Vitals shown include unvalidated device data.  Last Pain:  Vitals:   03/22/20 1318  TempSrc:   PainSc: 0-No pain         Complications: No apparent anesthesia complications

## 2020-03-22 NOTE — Anesthesia Procedure Notes (Signed)
Procedure Name: MAC Date/Time: 03/22/2020 12:37 PM Performed by: Lollie Sails, CRNA Pre-anesthesia Checklist: Patient identified, Emergency Drugs available, Suction available, Patient being monitored and Timeout performed Oxygen Delivery Method: Nasal cannula

## 2020-03-23 ENCOUNTER — Other Ambulatory Visit: Payer: Self-pay

## 2020-03-23 ENCOUNTER — Ambulatory Visit: Payer: Self-pay | Admitting: Nurse Practitioner

## 2020-03-23 NOTE — Anesthesia Postprocedure Evaluation (Signed)
Anesthesia Post Note  Patient: Monica Erickson  Procedure(s) Performed: ESOPHAGOGASTRODUODENOSCOPY (EGD) WITH PROPOFOL (N/A ) COLONOSCOPY WITH PROPOFOL (N/A ) BIOPSY     Patient location during evaluation: PACU Anesthesia Type: MAC Level of consciousness: awake and alert Pain management: pain level controlled Vital Signs Assessment: post-procedure vital signs reviewed and stable Respiratory status: spontaneous breathing, nonlabored ventilation, respiratory function stable and patient connected to nasal cannula oxygen Cardiovascular status: stable and blood pressure returned to baseline Postop Assessment: no apparent nausea or vomiting Anesthetic complications: no    Last Vitals:  Vitals:   03/22/20 1340 03/22/20 1350  BP: (!) 116/56   Pulse: 65 65  Resp: 15 16  Temp:    SpO2: 97% 99%    Last Pain:  Vitals:   03/22/20 1350  TempSrc:   PainSc: 0-No pain                 [name] DAVID

## 2020-03-26 ENCOUNTER — Other Ambulatory Visit: Payer: Self-pay

## 2020-03-27 ENCOUNTER — Encounter: Payer: Self-pay | Admitting: *Deleted

## 2020-03-27 LAB — SURGICAL PATHOLOGY

## 2020-03-28 ENCOUNTER — Other Ambulatory Visit: Payer: Self-pay

## 2020-03-28 ENCOUNTER — Telehealth: Payer: Self-pay | Admitting: Gastroenterology

## 2020-03-28 MED ORDER — PYLERA 140-125-125 MG PO CAPS
3.0000 | ORAL_CAPSULE | Freq: Three times a day (TID) | ORAL | 0 refills | Status: DC
Start: 2020-03-28 — End: 2020-05-29

## 2020-03-28 NOTE — Telephone Encounter (Signed)
Pt's daughter inquired whether Pylera can be mixed with liquid before drinking.  She stated that pt has dementia and is unable to take pills.

## 2020-03-28 NOTE — Telephone Encounter (Signed)
The pt daughter was advised that she can ask the pharmacy about ways to make the medication easier to take.  The pt has been advised of the information and verbalized understanding.

## 2020-03-30 ENCOUNTER — Ambulatory Visit: Payer: Self-pay | Admitting: Nurse Practitioner

## 2020-04-04 ENCOUNTER — Encounter: Payer: Self-pay | Admitting: Family Medicine

## 2020-04-30 ENCOUNTER — Telehealth: Payer: Self-pay | Admitting: Gastroenterology

## 2020-04-30 NOTE — Telephone Encounter (Signed)
Pt's daughter [name] is requesting an alternative medication for Pylera, she states this is too expensive $600.   Pharmacy [facility]

## 2020-05-01 NOTE — Telephone Encounter (Signed)
Wow, pylera was called in over a month ago.  Definitely OK to split into it's component parts: Bismuth subsalicylate 262mg  pills, two pills QID;  Metronidazoe 500mg  pill,one pill QID;  Tetracycline 500mg  pill, one pill QID.   At the same time they need to be on twice daily PPI.  If currently only on once daily PPI then they should supplement with OTC omeprazole 20mg  pill daily as well (12 hours after the first PPI dose).   thanks

## 2020-05-02 MED ORDER — BISMUTH SUBSALICYLATE 262 MG PO TABS: 2.0000 | ORAL_TABLET | Freq: Four times a day (QID) | ORAL | 0 refills | Status: DC

## 2020-05-02 MED ORDER — OMEPRAZOLE 20 MG PO TBEC: 20.0000 mg | DELAYED_RELEASE_TABLET | Freq: Every day | ORAL | Status: DC

## 2020-05-02 MED ORDER — METRONIDAZOLE 500 MG PO TABS
500.0000 mg | ORAL_TABLET | Freq: Four times a day (QID) | ORAL | 0 refills | Status: DC
Start: 2020-05-02 — End: 2021-07-11

## 2020-05-02 MED ORDER — TETRACYCLINE HCL 500 MG PO CAPS: 500.0000 mg | ORAL_CAPSULE | Freq: Four times a day (QID) | ORAL | 0 refills | Status: DC

## 2020-05-02 NOTE — Telephone Encounter (Signed)
[name] [name] notified that Pylera has been split into component parts:  1-Bismuth subsalicylate 262 mg take 2 pills 4 times daily 2-Metronidazole 500mg  take 1 pill 4 times daily 3-Tetracycline 500mg  take 1 pill 4 times daily 4-omeprazole 40mg  one capsule in the am before breakfast 5-omeprazole 20mg  over-the-counter one capsule 12 hours after       Morning dose.  Rx sent to pharmacy.  [name] advised to contact our office with any further questions or concerns.

## 2020-05-29 NOTE — Addendum Note (Signed)
Addended by: Lamona Curl on: 05/29/2020 09:35 AM   Modules accepted: Orders

## 2020-06-04 ENCOUNTER — Ambulatory Visit: Payer: Medicare Other | Admitting: Gastroenterology

## 2020-06-08 ENCOUNTER — Telehealth: Payer: Self-pay | Admitting: Gastroenterology

## 2020-06-08 NOTE — Telephone Encounter (Signed)
I called [name] the daughter and she said with Mom's dementia she needs something to replace the omeprazole either a liquid or dissolving tablet. They are having trouble with all her medicines.  Also the daughter inquires if Mom would be a surgery candidate.  Please advise Sir, thank you.

## 2020-06-08 NOTE — Telephone Encounter (Signed)
Patients daughter called states the patient is not able to continue with the medications for she can not swallow them.

## 2020-06-09 NOTE — Telephone Encounter (Signed)
I didn't know she was having swallowing trouble. When did that start?  Please offer prevacid solutab 30mg , one tab once daily disp 1 month with 11 refills.   I am not sure what kind of surgery she is asking about?

## 2020-06-11 MED ORDER — LANSOPRAZOLE 30 MG PO TBDD: 30.0000 mg | DELAYED_RELEASE_TABLET | Freq: Every day | ORAL | 11 refills | Status: DC

## 2020-06-11 NOTE — Telephone Encounter (Signed)
I spoke with [name] and she said the dementia is causing Mom to not want to take any of her pills. Not dysphagia. She said thank you for switching the medicine. They will try that and call us back if needed. When I ask her what type of surgery is she talking about she said she didn't mean surgery she just wondered what else could be done if she can't take the pills.

## 2020-06-12 ENCOUNTER — Emergency Department (HOSPITAL_COMMUNITY): Payer: Medicare Other

## 2020-06-12 ENCOUNTER — Emergency Department (HOSPITAL_COMMUNITY)
Admission: EM | Admit: 2020-06-12 | Discharge: 2020-06-12 | Disposition: A | Discharge status: Home / Self Care | Payer: Medicare Other | Attending: Emergency Medicine | Admitting: Emergency Medicine

## 2020-06-12 ENCOUNTER — Other Ambulatory Visit: Payer: Self-pay

## 2020-06-12 ENCOUNTER — Telehealth: Payer: Self-pay | Admitting: Family Medicine

## 2020-06-12 ENCOUNTER — Encounter (HOSPITAL_COMMUNITY): Payer: Self-pay | Admitting: Emergency Medicine

## 2020-06-12 DIAGNOSIS — F0390 Unspecified dementia without behavioral disturbance: Secondary | ICD-10-CM | POA: Insufficient documentation

## 2020-06-12 DIAGNOSIS — M79602 Pain in left arm: Secondary | ICD-10-CM | POA: Diagnosis present

## 2020-06-12 LAB — CBC WITH DIFFERENTIAL/PLATELET
Abs Immature Granulocytes: 0.05 10*3/uL (ref 0.00–0.07)
Basophils Absolute: 0 10*3/uL (ref 0.0–0.1)
Basophils Relative: 0 %
Eosinophils Absolute: 0.2 10*3/uL (ref 0.0–0.5)
Eosinophils Relative: 2 %
HCT: 35.4 % — ABNORMAL LOW (ref 36.0–46.0)
Hemoglobin: 11.3 g/dL — ABNORMAL LOW (ref 12.0–15.0)
Immature Granulocytes: 1 %
Lymphocytes Relative: 29 %
Lymphs Abs: 2 10*3/uL (ref 0.7–4.0)
MCH: 30.1 pg (ref 26.0–34.0)
MCHC: 31.9 g/dL (ref 30.0–36.0)
MCV: 94.1 fL (ref 80.0–100.0)
Monocytes Absolute: 0.5 10*3/uL (ref 0.1–1.0)
Monocytes Relative: 8 %
Neutro Abs: 4.3 10*3/uL (ref 1.7–7.7)
Neutrophils Relative %: 60 %
Platelets: 175 10*3/uL (ref 150–400)
RBC: 3.76 MIL/uL — ABNORMAL LOW (ref 3.87–5.11)
RDW: 15.4 % (ref 11.5–15.5)
WBC: 7.1 10*3/uL (ref 4.0–10.5)
nRBC: 0 % (ref 0.0–0.2)

## 2020-06-12 NOTE — Telephone Encounter (Signed)
Agree completely

## 2020-06-12 NOTE — Telephone Encounter (Signed)
Pt is currently in the emergency room at [facility]. Pls advise if appt should be cancelled

## 2020-06-12 NOTE — ED Provider Notes (Signed)
[facility] Emergency Department Provider Note MRN:  [id number]  Arrival date & time: 06/12/20     Chief Complaint   Arm pain History of Present Illness   Monica Erickson is a 74 y.o. year-old female with a history of dementia presenting to the ED with chief complaint of arm pain.  Unwitnessed fall today, patient complaining of left arm pain, not wanting to move the arm.  Patient has advanced dementia and is unable to answer questions.  I was unable to obtain an accurate HPI, PMH, or ROS due to the patient's dementia.  Level 5 caveat.  Review of Systems  Positive for dementia, arm pain, fall.  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 pylori infection 03/2020   Kidney stones    Night terrors    Sleep apnea    no cpap    Past Surgical History:  Procedure Laterality Date   BIOPSY  03/22/2020   Procedure: BIOPSY;  Surgeon: Rachael Fee, MD;  Location: [facility];  Service: Endoscopy;;   COLONOSCOPY WITH PROPOFOL N/A 03/22/2020   Procedure: COLONOSCOPY WITH PROPOFOL;  Surgeon: Rachael Fee, MD;  Location: [facility];  Service: Endoscopy;  Laterality: N/A;   CYSTOSCOPY WITH STENT PLACEMENT Right 12/07/2018   Procedure: CYSTOSCOPY WITH STENT PLACEMENT;  Surgeon: Crist Fat, MD;  Location: [facility];  Service: Urology;  Laterality: Right;   CYSTOSCOPY/URETEROSCOPY/HOLMIUM LASER/STENT PLACEMENT Right 12/31/2018   Procedure: CYSTOSCOPY RIGHT URETEROSCOPY/HOLMIUM LASER RIGHT Lucretia Roers;  Surgeon: Crist Fat, MD;  Location: [facility];  Service: Urology;  Laterality: Right;   ESOPHAGOGASTRODUODENOSCOPY (EGD) WITH PROPOFOL N/A 03/22/2020   Procedure: ESOPHAGOGASTRODUODENOSCOPY (EGD) WITH PROPOFOL;   Surgeon: Rachael Fee, MD;  Location: [facility];  Service: Endoscopy;  Laterality: N/A;   REPLACEMENT TOTAL KNEE Bilateral 5 YRS AGO    Family History  Problem Relation Age of Onset   Diabetes Mother    Hypertension Father    Colon cancer Neg Hx    Esophageal cancer Neg Hx    Stomach cancer Neg Hx    Rectal cancer Neg Hx     Social History   Socioeconomic History   Marital status: Single    Spouse name: Not on file   Number of children: 3   Years of education: Not on file   Highest education level: Not on file  Occupational History   Occupation: retired  Tobacco Use   Smoking status: Former Smoker   Smokeless tobacco: Never Used  Building services engineer Use: Never used  Substance and Sexual Activity   Alcohol use: Yes    Comment: occasional wine   Drug use: Never   Sexual activity: Not Currently  Other Topics Concern   Not on file  Social History Narrative   Not on file   Social Determinants of Health   Financial Resource Strain:    Difficulty of Paying Living Expenses:   Food Insecurity:    Worried About Programme researcher, broadcasting/film/video in the Last Year:    Barista in the Last Year:   Transportation Needs:    Freight forwarder (Medical):    Lack of Transportation (Non-Medical):   Physical  Activity:    Days of Exercise per Week:    Minutes of Exercise per Session:   Stress:    Feeling of Stress :   Social Connections:    Frequency of Communication with Friends and Family:    Frequency of Social Gatherings with Friends and Family:    Attends Religious Services:    Active Member of Clubs or Organizations:    Attends Banker Meetings:    Marital Status:   Intimate Partner Violence:    Fear of Current or Ex-Partner:    Emotionally Abused:    Physically Abused:    Sexually Abused:      Physical Exam   Vitals:   06/12/20 1706 06/12/20 1958  BP: 130/64 (!) 142/93  Pulse: 83 84  Resp: 18 16  Temp:   98.5 F (36.9 C)  SpO2: 100% 100%    CONSTITUTIONAL: Chronically ill-appearing, NAD NEURO:  Alert and oriented x 3, no focal deficits EYES:  eyes equal and reactive ENT/NECK:  no LAD, no JVD CARDIO: Regular rate, well-perfused, normal S1 and S2 PULM:  CTAB no wheezing or rhonchi GI/GU:  normal bowel sounds, non-distended, non-tender MSK/SPINE:  No gross deformities, no edema, tenderness palpation to the left humerus with reduced range of motion of the left arm due to pain, neurovascularly intact distally SKIN:  no rash, atraumatic PSYCH:  Appropriate speech and behavior  *Additional and/or pertinent findings included in MDM below  Diagnostic and Interventional Summary    EKG Interpretation  Date/Time:    Ventricular Rate:    PR Interval:    QRS Duration:   QT Interval:    QTC Calculation:   R Axis:     Text Interpretation:        Labs Reviewed  CBC WITH DIFFERENTIAL/PLATELET - Abnormal; Notable for the following components:      Result Value   RBC 3.76 (*)    Hemoglobin 11.3 (*)    HCT 35.4 (*)    All other components within normal limits  URINALYSIS, ROUTINE W REFLEX MICROSCOPIC    CT Head Wo Contrast  Final Result    DG Shoulder Left  Final Result    DG Humerus Left  Final Result      Medications - No data to display   Procedures  /  Critical Care Procedures  ED Course and Medical Decision Making  I have reviewed the triage vital signs, the nursing notes, and pertinent available records from the EMR.  Listed above are laboratory and imaging tests that I personally ordered, reviewed, and interpreted and then considered in my medical decision making (see below for details).      Unwitnessed fall, most likely mechanical however it is difficult to determine due to patient's dementia and there being no family or friends present to provide further information, unable to reach anyone via phone.  Patient is in no acute distress, normal vital signs, reportedly  at her baseline mental status.  There is some, and her documentation the family may be concerned about a UTI but not sure of the reasoning given that we are unable to reach them.  Patient has no fever, no suprapubic tenderness, no leukocytosis.  Her CT head is reassuring, her x-Kotch showing a subtle abnormality that could suggest fracture, which I suspect given her exam as she is tender with reduced range of motion.  Placed in sling and advised Ortho follow-up.  I do not see an indication to keep her here for urinalysis, if  family has continued concerns about this her urine can be tested by PCP.    Elmer Sow. Pilar Plate, MD [facility] Emergency Medicine [facility] [email or web address]  Final Clinical Impressions(s) / ED Diagnoses     ICD-10-CM   1. Pain of left upper extremity  M79.602     ED Discharge Orders    None       Discharge Instructions Discussed with and Provided to Patient:     Discharge Instructions     You were evaluated in the Emergency Department and after careful evaluation, we did not find any emergent condition requiring admission or further testing in the hospital.  Your x-Torpey showed a possible broken bone in your arm.  Please keep the sling on for comfort and follow-up with the orthopedic specialist within the next 1 to 2 weeks, possibly for repeat x-rays.  Please use Tylenol at home for discomfort, 1000 mg every 4-6 hours.  Please return to the Emergency Department if you experience any worsening of your condition.  Thank you for allowing [place] to be a part of your care.       Sabas Sous, MD 06/12/20 2026

## 2020-06-12 NOTE — ED Notes (Signed)
Pts daughter was called by Bennetta Laos and informed that her mother was placed for discharge, RN explained discharge instructions to daughter with this NT present. Daughter stated she was 15 minutes away, this NT prepared the pt to be taken to the front, once there daughter was called where she stated she was now 5 minutes away. This NT waited with the pt until daughter arrived. This NT informed the daughter that her mother was very confused after she left and that it would be best for her in the future for someone to stay with her if possible. Daughter was informed that once the pt was brought back to the room it took great effort to reorient the pt in order to get her out of the wheelchair and into the bed. Once in the bed the pt had started crying hysterically and was very hard to get to cooperate to even get a BP due to her crying from it being taken. Daughter informed this NT that she has children at home, this NT explained that this is understood but if at all possible it would best for the pt and her safety if a support person were able to stay with her. It was explained that although there is a no visitor policy at the moment, this pt meets criteria to be able to have a visitor with her while in the waiting room and this is what is best for the pt. Daughter stated understanding.

## 2020-06-12 NOTE — Telephone Encounter (Signed)
I just spoke with Monica Erickson again and she said to pass on to you Sir that it's Mom's choice not to eat or take her meds. She throws them into the floor sometimes. Monica Erickson said they are at the [facility] ED now as she found Mom in the floor this AM and she couldn't raise her left arm.

## 2020-06-12 NOTE — Discharge Instructions (Addendum)
You were evaluated in the Emergency Department and after careful evaluation, we did not find any emergent condition requiring admission or further testing in the hospital.  Your x-Perkins showed a possible broken bone in your arm.  Please keep the sling on for comfort and follow-up with the orthopedic specialist within the next 1 to 2 weeks, possibly for repeat x-rays.  Please use Tylenol at home for discomfort, 1000 mg every 4-6 hours.  Please return to the Emergency Department if you experience any worsening of your condition.  Thank you for allowing [place] to be a part of your care.

## 2020-06-12 NOTE — ED Notes (Signed)
Patient verbalizes understanding of discharge instructions. Opportunity for questioning and answers were provided. Armband removed by staff, pt discharged from ED via wheelchair. Discharge instructions reviewed pt's daughter, who states she will pick her up.

## 2020-06-12 NOTE — ED Notes (Signed)
Pt daughter [name]  would like a call when pt is ready to be picked up. Please call [phone]

## 2020-06-12 NOTE — ED Triage Notes (Signed)
Pt here from home with family with c/o left shoulder pain after being found in the floor by family , pt will not move her arm , pt has dementia according to family and is at her baseline , family thinks pt may also have an UTI

## 2020-06-12 NOTE — Telephone Encounter (Signed)
If she cannot safely eat or even take her pills then her options are pretty limited.  She could consider placement of a G tube for nutrition and meds. This is something they should discuss with her PCP.  If they want to go ahead with it I suggest IR approach as I stopped placing PEG tubes as of several years ago.

## 2020-06-13 ENCOUNTER — Ambulatory Visit: Payer: Self-pay | Admitting: Nurse Practitioner

## 2020-06-15 ENCOUNTER — Other Ambulatory Visit: Payer: Self-pay

## 2020-06-15 ENCOUNTER — Ambulatory Visit (INDEPENDENT_AMBULATORY_CARE_PROVIDER_SITE_OTHER): Payer: Medicare Other | Admitting: Nurse Practitioner

## 2020-06-15 VITALS — BP 116/77 | HR 79 | Temp 99.1°F | Resp 14 | Ht 68.0 in | Wt 156.0 lb

## 2020-06-15 DIAGNOSIS — M79602 Pain in left arm: Secondary | ICD-10-CM

## 2020-06-15 DIAGNOSIS — Z0000 Encounter for general adult medical examination without abnormal findings: Secondary | ICD-10-CM | POA: Diagnosis not present

## 2020-06-15 DIAGNOSIS — E876 Hypokalemia: Secondary | ICD-10-CM | POA: Diagnosis not present

## 2020-06-15 DIAGNOSIS — L602 Onychogryphosis: Secondary | ICD-10-CM

## 2020-06-15 DIAGNOSIS — F0280 Dementia in other diseases classified elsewhere without behavioral disturbance: Secondary | ICD-10-CM

## 2020-06-15 NOTE — Patient Instructions (Signed)
Dementia Caregiver Guide Dementia is a term used to describe a number of symptoms that affect memory and thinking. The most common symptoms include:  Memory loss.  Trouble with language and communication.  Trouble concentrating.  Poor judgment.  Problems with reasoning.  Child-like behavior and language.  Extreme anxiety.  Angry outbursts.  Wandering from home or public places. Dementia usually gets worse slowly over time. In the early stages, people with dementia can stay independent and safe with some help. In later stages, they need help with daily tasks such as dressing, grooming, and using the bathroom. How to help the person with dementia cope Dementia can be frightening and confusing. Here are some tips to help the person with dementia cope with changes caused by the disease. General tips  Keep the person on track with his or her routine.  Try to identify areas where the person may need help.  Be supportive, patient, calm, and encouraging.  Gently remind the person that adjusting to changes takes time.  Help with the tasks that the person has asked for help with.  Keep the person involved in daily tasks and decisions as much as possible.  Encourage conversation, but try not to get frustrated or harried if the person struggles to find words or does not seem to appreciate your help. Communication tips  When the person is talking or seems frustrated, make eye contact and hold the person's hand.  Ask specific questions that need yes or no answers.  Use simple words, short sentences, and a calm voice. Only give one direction at a time.  When offering choices, limit them to just 1 or 2.  Avoid correcting the person in a negative way.  If the person is struggling to find the right words, gently try to help him or her. How to recognize symptoms of stress Symptoms of stress in caregivers include:  Feeling frustrated or angry with the person with  dementia.  Denying that the person has dementia or that his or her symptoms will not improve.  Feeling hopeless and unappreciated.  Difficulty sleeping.  Difficulty concentrating.  Feeling anxious, irritable, or depressed.  Developing stress-related health problems.  Feeling like you have too little time for your own life. Follow these instructions at home:   Make sure that you and the person you are caring for: ? Get regular sleep. ? Exercise regularly. ? Eat regular, nutritious meals. ? Drink enough fluid to keep your urine clear or pale yellow. ? Take over-the-counter and prescription medicines only as told by your health care providers. ? Attend all scheduled health care appointments.  Join a support group with others who are caregivers.  Ask about respite care resources so that you can have a regular break from the stress of caregiving.  Look for signs of stress in yourself and in the person you are caring for. If you notice signs of stress, take steps to manage it.  Consider any safety risks and take steps to avoid them.  Organize medications in a pill box for each day of the week.  Create a plan to handle any legal or financial matters. Get legal or financial advice if needed.  Keep a calendar in a central location to remind the person of appointments or other activities. Tips for reducing the risk of injury  Keep floors clear of clutter. Remove rugs, magazine racks, and floor lamps.  Keep hallways well lit, especially at night.  Put a handrail and nonslip mat in the bathtub or   shower.  Put childproof locks on cabinets that contain dangerous items, such as medicines, alcohol, guns, toxic cleaning items, sharp tools or utensils, matches, and lighters.  Put the locks in places where the person cannot see or reach them easily. This will help ensure that the person does not wander out of the house and get lost.  Be prepared for emergencies. Keep a list of  emergency phone numbers and addresses in a convenient area.  Remove car keys and lock garage doors so that the person does not try to get in the car and drive.  Have the person wear a bracelet that tracks locations and identifies the person as having memory problems. This should be worn at all times for safety. Where to find support: Many individuals and organizations offer support. These include:  Support groups for people with dementia and for caregivers.  Counselors or therapists.  Home health care services.  Adult day care centers. Where to find more information Alzheimer's Association: www.alz.org Contact a health care provider if:  The person's health is rapidly getting worse.  You are no longer able to care for the person.  Caring for the person is affecting your physical and emotional health.  The person threatens himself or herself, you, or anyone else. Summary  Dementia is a term used to describe a number of symptoms that affect memory and thinking.  Dementia usually gets worse slowly over time.  Take steps to reduce the person's risk of injury, and to plan for future care.  Caregivers need support, relief from caregiving, and time for their own lives. This information is not intended to replace advice given to you by your health care provider. Make sure you discuss any questions you have with your health care provider. Document Revised: 10/09/2017 Document Reviewed: 09/30/2016 Elsevier Patient Education  2020 Elsevier Inc.  

## 2020-06-15 NOTE — Progress Notes (Signed)
[facility] [address] [place], [place]  [address] Phone:  [phone]   Fax:  [phone]   Established Patient Office Visit  Subjective:  Patient ID: Monica SaltLoretta Horseman, female    DOB: 09/08/1946  Age: 74 y.o. MRN: [id number]  CC:  Chief Complaint  Patient presents with  . Follow-up    left arm fx from fall out of bed.   . Nail Problem    needs referral for nail trim   . Follow-up    needs helps getting poa assigned   . Mass    mass or bone on uppper chest/neck area. Daughter is concerned   . Finger Injury    keyring stuck on finger.     HPI Monica SaltLoretta Erickson presents for follow up.She  has a past medical history of Chronic headaches, Dementia (HCC), Depression, Difficult intravenous access (2021), Gastric ulcer, H. pylori infection (03/2020), Kidney stones, Night terrors, and Sleep apnea.   She is accompanied by patient and daughter. Primary caregiver is patient and daughters. The family and the patient identify problems with changes in short and long term memory. Family and patient report problems with suspiciousness. Family and patient are concerned about  inability to maintain adequate nutrition, however, they are not concerned about driving and cooking. Her daughter admits that the family has agreed that the medication for dementia is not effective and the side effect outweigh any benefit. She is not longer taking this. Medication administration: family sets up medications  Functional Assessment:  Activities of Daily Living (ADLs):   She is independent in the following: ambulation, feeding and toileting. She was seen in the ER on 06/12/20 for an unwitnessed fall. She continues to have some occasional pain. She is no longer wearing her left arm sling. Her daughter admits that she wore it less than 1 day. She is concern about her collar bone . She denies any pain in that area. However it just look different. She admits that her mother has lost weight however feels like  her diet is improving. She admits that [place]. She would like to talk with the Child psychotherapistsocial worker.  Her daughter also would like to have Mrs Rays toenails cut.   Cataract  Isis   Past Medical History:  Diagnosis Date  . Chronic headaches   . 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 Depression   . Difficult intravenous access 2021   needs anesthesia , IV team, ultrasound for IV   . Gastric ulcer   . H. pylori infection 03/2020  . Kidney stones   . Night terrors   . Sleep apnea    no cpap    Past Surgical History:  Procedure Laterality Date  . BIOPSY  03/22/2020   Procedure: BIOPSY;  Surgeon: Rachael FeeJacobs, Daniel P, MD;  [facility];  Service: Endoscopy;;  . COLONOSCOPY WITH PROPOFOL N/A 03/22/2020   Procedure: COLONOSCOPY WITH PROPOFOL;  Surgeon: Rachael FeeJacobs, Daniel P, MD;  Location: [facility];  Service: Endoscopy;  Laterality: N/A;  . CYSTOSCOPY WITH STENT PLACEMENT Right 12/07/2018   Procedure: CYSTOSCOPY WITH STENT PLACEMENT;  Surgeon: Crist FatHerrick, Benjamin W, MD;  Location: [facility];  Service: Urology;  Laterality: Right;  . CYSTOSCOPY/URETEROSCOPY/HOLMIUM LASER/STENT PLACEMENT Right 12/31/2018   Procedure: CYSTOSCOPY RIGHT URETEROSCOPY/HOLMIUM LASER RIGHT Lucretia RoersSTENT EXGHANGE;  Surgeon: Crist FatHerrick, Benjamin W, MD;  [facility];  Service: Urology;  Laterality: Right;  . ESOPHAGOGASTRODUODENOSCOPY (EGD) WITH PROPOFOL N/A 03/22/2020  Procedure: ESOPHAGOGASTRODUODENOSCOPY (EGD) WITH PROPOFOL;  Surgeon: Rachael Fee, MD;  Location: [facility];  Service: Endoscopy;  Laterality: N/A;  . REPLACEMENT TOTAL KNEE Bilateral 5 YRS AGO    Family History  Problem Relation Age of Onset  . Diabetes Mother   . Hypertension Father   . Colon cancer Neg Hx   . Esophageal cancer Neg Hx   . Stomach cancer Neg Hx   . Rectal cancer Neg Hx     Social History   Socioeconomic History  . Marital status: Single    Spouse name: Not on file  . Number of  children: 3  . Years of education: Not on file  . Highest education level: Not on file  Occupational History  . Occupation: retired  Tobacco Use  . Smoking status: Former Games developer  . Smokeless tobacco: Never Used  Vaping Use  . Vaping Use: Never used  Substance and Sexual Activity  . Alcohol use: Yes    Comment: occasional wine  . Drug use: Never  . Sexual activity: Not Currently  Other Topics Concern  . Not on file  Social History Narrative  . Not on file   Social Determinants of Health   Financial Resource Strain:   . Difficulty of Paying Living Expenses:   Food Insecurity:   . Worried About Programme researcher, broadcasting/film/video in the Last Year:   . Barista in the Last Year:   Transportation Needs:   . Freight forwarder (Medical):   Marland Kitchen Lack of Transportation (Non-Medical):   Physical Activity:   . Days of Exercise per Week:   . Minutes of Exercise per Session:   Stress:   . Feeling of Stress :   Social Connections:   . Frequency of Communication with Friends and Family:   . Frequency of Social Gatherings with Friends and Family:   . Attends Religious Services:   . Active Member of Clubs or Organizations:   . Attends Banker Meetings:   Marland Kitchen Marital Status:   Intimate Partner Violence:   . Fear of Current or Ex-Partner:   . Emotionally Abused:   Marland Kitchen Physically Abused:   . Sexually Abused:     Outpatient Medications Prior to Visit  Medication Sig Dispense Refill  . lansoprazole (PREVACID SOLUTAB) 30 MG disintegrating tablet Take 1 tablet (30 mg total) by mouth daily before breakfast. 30 tablet 11  . Bismuth Subsalicylate 262 MG TABS Take 2 tablets (524 mg total) by mouth in the morning, at noon, in the evening, and at bedtime. (Patient not taking: Reported on 06/15/2020) 240 tablet 0  . LORazepam (ATIVAN PO) Take 1 tablet by mouth at bedtime. (Patient not taking: Reported on 06/15/2020)    . memantine (NAMENDA) 5 MG tablet Take 5 mg by mouth at bedtime. (Patient not  taking: Reported on 06/15/2020)    . metroNIDAZOLE (FLAGYL) 500 MG tablet Take 1 tablet (500 mg total) by mouth 4 (four) times daily. (Patient not taking: Reported on 06/15/2020) 120 tablet 0  . mirtazapine (REMERON SOL-TAB) 30 MG disintegrating tablet Take 30 mg by mouth at bedtime.    . mirtazapine (REMERON) 15 MG tablet Take 15 mg by mouth at bedtime. (Patient not taking: Reported on 06/15/2020)    . Omeprazole 20 MG TBEC Take 1 tablet (20 mg total) by mouth daily. Please take 12 hours after omeprazole 40mg  (Patient not taking: Reported on 06/15/2020) 30 tablet   . tetracycline (SUMYCIN) 500 MG capsule Take 1 capsule (  500 mg total) by mouth 4 (four) times daily. (Patient not taking: Reported on 06/15/2020) 120 capsule 0   Facility-Administered Medications Prior to Visit  Medication Dose Route Frequency Provider Last Rate Last Admin  . 0.9 %  sodium chloride infusion  500 mL Intravenous Continuous Rachael Fee, MD        No Known Allergies  ROS Review of Systems  Unable to perform ROS: Dementia  Musculoskeletal:       Left arm pain      Objective:    Physical Exam Vitals reviewed.  HENT:     Nose: Nose normal.     Mouth/Throat:     Mouth: Mucous membranes are moist.  Eyes:     Pupils: Pupils are equal, round, and reactive to light.  Cardiovascular:     Rate and Rhythm: Normal rate and regular rhythm.     Pulses: Normal pulses.     Heart sounds: Normal heart sounds.  Pulmonary:     Effort: Pulmonary effort is normal.     Breath sounds: Normal breath sounds.  Abdominal:     Palpations: Abdomen is soft.  Musculoskeletal:        General: Normal range of motion.     Left shoulder: Bony tenderness present.     Cervical back: Normal range of motion.  Skin:    General: Skin is warm and dry.     Capillary Refill: Capillary refill takes less than 2 seconds.  Neurological:     Mental Status: She is alert.     Comments: Dementia; oriented to self     BP 116/77 (BP Location:  Right Arm, Patient Position: Sitting, Cuff Size: Normal)   Pulse 79   Temp 99.1 F (37.3 C) (Oral)   Resp 14   Ht 5\' 8"  (1.727 m)   Wt 156 lb (70.8 kg)   SpO2 100%   BMI 23.72 kg/m  Wt Readings from Last 3 Encounters:  06/15/20 156 lb (70.8 kg)  03/21/20 142 lb (64.4 kg)  02/17/20 142 lb 2 oz (64.5 kg)     Health Maintenance Due  Topic Date Due  . Hepatitis C Screening  Never done  . TETANUS/TDAP  Never done  . MAMMOGRAM  Never done  . DEXA SCAN  Never done  . PNA vac Low Risk Adult (1 of 2 - PCV13) Never done  . INFLUENZA VACCINE  06/10/2020    There are no preventive care reminders to display for this patient.  No results found for: TSH Lab Results  Component Value Date   WBC 7.1 06/12/2020   HGB 11.3 (L) 06/12/2020   HCT 35.4 (L) 06/12/2020   MCV 94.1 06/12/2020   PLT 175 06/12/2020   Lab Results  Component Value Date   NA 137 12/06/2019   K 3.1 (L) 12/06/2019   CO2 20 (L) 12/06/2019   GLUCOSE 127 (H) 12/06/2019   BUN 10 12/06/2019   CREATININE 0.94 12/06/2019   BILITOT 1.4 (H) 12/06/2019   ALKPHOS 68 12/06/2019   AST 15 12/06/2019   ALT 8 12/06/2019   PROT 7.3 12/06/2019   ALBUMIN 4.0 12/06/2019   CALCIUM 9.5 12/06/2019   ANIONGAP 13 12/06/2019   No results found for: CHOL No results found for: HDL No results found for: LDLCALC No results found for: TRIG No results found for: [facility] Lab Results  Component Value Date   HGBA1C 5.4 12/15/2019      Assessment & Plan:   Problem List Items Addressed  This Visit      Nervous and Auditory   Dementia without behavioral disturbance (HCC) (Chronic) Continue to follow-up with neurology as scheduled Briefly discussed medication with [place].  No changes made allowing neurology to make any.   Relevant Medications   mirtazapine (REMERON SOL-TAB) 30 MG disintegrating tablet    Other Visit Diagnoses    Hypokalemia    -  Primary Lab results from January 2021 indicated hypokalemia with potassium of 3.1.   Due to Ms. Carte's underlying condition of dementia.  Labs have not been obtained.  Encourage daughter to continue to make healthy food choices that will promote potassium   Relevant Orders   Comp. Metabolic Panel (12)   Healthcare maintenance    Encourage daughter that if any additional assistance to please call our office.  Message sent social worker.  Daughter seeking assistance for power of attorney due to the dementia.   Relevant Orders   Lipid panel   TSH   Long toenail       Relevant Orders   Ambulatory referral to Podiatry   Arm pain, lateral, left          No orders of the defined types were placed in this encounter.   Follow-up: Return in about 3 months (around 09/15/2020).    [name], NP

## 2020-06-19 ENCOUNTER — Telehealth: Payer: Self-pay | Admitting: Clinical

## 2020-06-19 NOTE — Telephone Encounter (Signed)
Integrated Behavioral Health Case Management Referral Note  06/19/2020 Name: Monica Erickson MRN: [id number] DOB: Jul 01, 1946 Monica Erickson is a 74 y.o. year old female who sees [name], NP for primary care. LCSW was consulted to assess patient's needs and assist the patient with Advanced Directive Education.  Interpreter: No.   Interpreter Name & Language: none  Assessment: Patient experiencing Memory Deficits. CSW received referral after patient's last office visit for assistance with Health Care Power of [place]. Called patient today, which is listed as daughter, Monica Erickson's number. Spoke to Monica Erickson who indicated they need a HCPOA on file for patient to have upcoming eye surgery. Reviewed HCPOA and advanced directives with Monica Erickson. She requested the forms be emailed to her. CSW emailed HCPOA forms and information. CSW available for follow up questions.  Review of patient status, including review of consultants reports, relevant laboratory and other test results, and collaboration with appropriate care team members and the patient's provider was performed as part of comprehensive patient evaluation and provision of services.    SDOH (Social Determinants of Health) assessments performed: No  Outpatient Encounter Medications as of 06/19/2020  Medication Sig  . Bismuth Subsalicylate 262 MG TABS Take 2 tablets (524 mg total) by mouth in the morning, at noon, in the evening, and at bedtime. (Patient not taking: Reported on 06/15/2020)  . lansoprazole (PREVACID SOLUTAB) 30 MG disintegrating tablet Take 1 tablet (30 mg total) by mouth daily before breakfast.  . LORazepam (ATIVAN PO) Take 1 tablet by mouth at bedtime. (Patient not taking: Reported on 06/15/2020)  . memantine (NAMENDA) 5 MG tablet Take 5 mg by mouth at bedtime. (Patient not taking: Reported on 06/15/2020)  . metroNIDAZOLE (FLAGYL) 500 MG tablet Take 1 tablet (500 mg total) by mouth 4 (four) times daily. (Patient not taking: Reported on 06/15/2020)  .  mirtazapine (REMERON) 15 MG tablet Take 15 mg by mouth at bedtime. (Patient not taking: Reported on 06/15/2020)  . Omeprazole 20 MG TBEC Take 1 tablet (20 mg total) by mouth daily. Please take 12 hours after omeprazole 40mg  (Patient not taking: Reported on 06/15/2020)  . tetracycline (SUMYCIN) 500 MG capsule Take 1 capsule (500 mg total) by mouth 4 (four) times daily. (Patient not taking: Reported on 06/15/2020)   Facility-Administered Encounter Medications as of 06/19/2020  Medication  . 0.9 %  sodium chloride infusion    Goals Addressed   None      Follow up Plan: 1. Sent daughter HCPOA form and information 2. CSW available from clinic as needed  08/19/2020, LCSW [facility] [phone]

## 2020-06-22 ENCOUNTER — Encounter: Payer: Self-pay | Admitting: Nurse Practitioner

## 2020-07-04 ENCOUNTER — Ambulatory Visit: Payer: Medicare Other | Admitting: Podiatry

## 2020-07-10 ENCOUNTER — Ambulatory Visit: Payer: Medicare Other | Admitting: Gastroenterology

## 2020-09-14 ENCOUNTER — Ambulatory Visit: Payer: Self-pay | Admitting: Nurse Practitioner

## 2020-09-20 ENCOUNTER — Ambulatory Visit: Payer: Self-pay | Admitting: *Deleted

## 2020-09-20 NOTE — Telephone Encounter (Signed)
[name] Monica Erickson calling with concerns of when to take the patient to the ED after being diagnosed with COVID a day and a half ago. [name] states that she has also been in contact with patient's PCP. [name] states that the patient has a history of Alzheimer's disease. [name] states that the patient has been sleeping more, change of appetite, body aches and has been getting winded with walking to the mailbox when she is normally able to walk up and down steps with no issues. Explained the most common symptoms seen with covid. Advised patient's daughter to take the patient to the ED if the patient develops worsening respiratory issues or other worsening symptoms. [name] also advised to follow up with PCP for symptom management.  Understanding verbalized.   Reason for Disposition . [1] COVID-19 diagnosed by positive lab test AND [2] mild symptoms (e.g., cough, fever, others) AND [3] no complications or SOB  Answer Assessment - Initial Assessment Questions 1. COVID-19 DIAGNOSIS: "Who made your Coronavirus (COVID-19) diagnosis?" "Was it confirmed by a positive lab test?" If not diagnosed by a HCP, ask "Are there lots of cases (community spread) where you live?" (See public health department website, if unsure)     Positive COVID results received a day and a half ago 2. COVID-19 EXPOSURE: "Was there any known exposure to COVID before the symptoms began?" CDC Definition of close contact: within 6 feet (2 meters) for a total of 15 minutes or more over a 24-hour period.      Not assessed 3. ONSET: "When did the COVID-19 symptoms start?"      Not assessed 4. WORST SYMPTOM: "What is your worst symptom?" (e.g., cough, fever, shortness of breath, muscle aches)     Fatigue, loss of appetite, body aches, becomes easily winded with walking 5. COUGH: "Do you have a cough?" If Yes, ask: "How bad is the cough?"       no 6. FEVER: "Do you have a fever?" If Yes, ask:  "What is your temperature, how was it measured, and when did it start?"     No 7. RESPIRATORY STATUS: "Describe your breathing?" (e.g., shortness of breath, wheezing, unable to speak)      Becomes easily winded with walking. [name] denies respiratory distress of gasping or air. 8. BETTER-SAME-WORSE: "Are you getting better, staying the same or getting worse compared to yesterday?"  If getting worse, ask, "In what way?"     The same 9. HIGH RISK DISEASE: "Do you have any chronic medical problems?" (e.g., asthma, heart or lung disease, weak immune system, obesity, etc.)     no 10. PREGNANCY: "Is there any chance you are pregnant?" "When was your last menstrual period?"       n/a 11. OTHER SYMPTOMS: "Do you have any other symptoms?"  (e.g., chills, fatigue, headache, loss of smell or taste, muscle pain, sore throat; new loss of smell or taste especially support the diagnosis of COVID-19)       Muscle pain, loss of appetite, fatigue  Protocols used: CORONAVIRUS (COVID-19) DIAGNOSED OR SUSPECTED-A-AH

## 2020-09-21 ENCOUNTER — Encounter: Payer: Self-pay | Admitting: Nurse Practitioner

## 2020-09-21 ENCOUNTER — Other Ambulatory Visit: Payer: Self-pay

## 2020-09-21 ENCOUNTER — Telehealth (INDEPENDENT_AMBULATORY_CARE_PROVIDER_SITE_OTHER): Payer: Medicare Other | Admitting: Nurse Practitioner

## 2020-09-21 DIAGNOSIS — Z8616 Personal history of COVID-19: Secondary | ICD-10-CM

## 2020-09-21 NOTE — Progress Notes (Signed)
   [facility] [address] [place], [place]  [address] Phone:  [phone]   Fax:  [phone] Virtual Visit via Telephone Note  I connected with Monica Erickson on 09/21/20 at 10:20 AM EST by telephone and verified that I am speaking with the correct person using two identifiers.   I discussed the limitations, risks, security and privacy concerns of performing an evaluation and management service by telephone and the availability of in person appointments. I also discussed with the patient that there may be a patient responsible charge related to this service. The patient expressed understanding and agreed to proceed.  Patient home Provider Office  History of Present Illness:  I spoke with Monica Erickson's daughter, Monica Erickson . She admits that today she has perked up. She is eating and walking around exploring as per her daily routine. She admits that Monica Erickson was with her granddaugther and her family for 2 weeks. Someone in that family contracted COV-19. They thought that she had left in time.  She admits that she was just sitting or stayed in the bed for about a week.  They did notice she coughed someone last week however that has resolved.  She did not complain  headache, dizziness, visual changes, shortness of breath, dyspnea on exertion, chest pain, nausea, vomiting or any edema.  She admits that she is pretty vocal when there are things that bother her.   Observations/Objective: No exam completed today due to virtual visit  Assessment and Plan: Assessment  Primary Diagnosis & Pertinent Problem List: The encounter diagnosis was Personal history of COVID-19.  Visit Diagnosis: 1. Personal history of COVID-19  Encouraged to monitor patient closely and follow-up if symptoms return or there are any other changes    Follow Up Instructions: Patient to follow-up as needed and in the next 6 months   I discussed the assessment and treatment plan with the patient. The patient was  provided an opportunity to ask questions and all were answered. The patient agreed with the plan and demonstrated an understanding of the instructions.   The patient was advised to call back or seek an in-person evaluation if the symptoms worsen or if the condition fails to improve as anticipated.  I provided 5 minutes of non-face-to-face time during this encounter.   [name], NP

## 2020-09-24 ENCOUNTER — Ambulatory Visit (INDEPENDENT_AMBULATORY_CARE_PROVIDER_SITE_OTHER): Payer: Medicare Other | Admitting: Podiatry

## 2020-09-24 ENCOUNTER — Encounter: Payer: Self-pay | Admitting: Podiatry

## 2020-09-24 ENCOUNTER — Other Ambulatory Visit: Payer: Self-pay

## 2020-09-24 DIAGNOSIS — B351 Tinea unguium: Secondary | ICD-10-CM | POA: Diagnosis not present

## 2020-09-24 DIAGNOSIS — M79675 Pain in left toe(s): Secondary | ICD-10-CM

## 2020-09-24 DIAGNOSIS — M79674 Pain in right toe(s): Secondary | ICD-10-CM | POA: Diagnosis not present

## 2020-09-24 DIAGNOSIS — F0280 Dementia in other diseases classified elsewhere without behavioral disturbance: Secondary | ICD-10-CM | POA: Insufficient documentation

## 2020-09-24 DIAGNOSIS — G309 Alzheimer's disease, unspecified: Secondary | ICD-10-CM | POA: Insufficient documentation

## 2020-09-24 DIAGNOSIS — F0281 Dementia in other diseases classified elsewhere with behavioral disturbance: Secondary | ICD-10-CM

## 2020-09-24 NOTE — Patient Instructions (Signed)
For normal skin: Moisturize feet once daily; do not apply between toes A.  CeraVe Daily Moisturizing Lotion B.  Vaseline Intensive Care Lotion C.  Lubriderm Lotion D.  Gold Bond Diabetic Foot Lotion E.  Eucerin Intensive Repair Moisturizing Lotion  For extremely dry, cracked feet: moisturize feet once daily; do not apply between toes A. CeraVe Healing Ointment B. Aquaphor Healing Ointment C. Vaseline Petroleum Healing Jelly   If you have problems reaching your feet: apply to feet once daily; do not apply between toes A.  Aquaphor Advanced Therapy Ointment Body Spray B.  Vaseline Intensive Care Spray Lotion Advanced Repair    

## 2020-09-27 ENCOUNTER — Encounter: Payer: Self-pay | Admitting: Podiatry

## 2020-09-27 NOTE — Progress Notes (Signed)
Subjective: Monica Erickson presents today referred by Vevelyn Francois, NP for complaint of painful thick toenails that are difficult to trim. Pain interferes with ambulation. Aggravating factors include wearing enclosed shoe gear.Daughter, [name],  is present during today's visit.  Daughter states [name] has severely elongated toenails. Patient has h/o dementia.   Past Medical History:  Diagnosis Date  . Chronic headaches   . Dementia ([place])    ALZHEIMERS FOR A YEAR  . Depression   . Difficult intravenous access 2021   needs anesthesia , IV team, ultrasound for IV   . Gastric ulcer   . H. pylori infection 03/2020  . Kidney stones   . Night terrors   . Sleep apnea    no cpap     Patient Active Problem List   Diagnosis Date Noted  . Alzheimer's dementia ([place]) 09/24/2020  . Generalized abdominal pain 02/17/2020  . Poor appetite 02/17/2020  . Weight loss 02/17/2020  . Constipation 02/17/2020  . Other hyperlipidemia 02/17/2020  . Belching 02/17/2020  . Stone, kidney 12/07/2018  . Dementia without behavioral disturbance ([place]) 12/07/2018     Past Surgical History:  Procedure Laterality Date  . BIOPSY  03/22/2020   Procedure: BIOPSY;  Surgeon: Milus Banister, MD;  Location: [facility];  Service: Endoscopy;;  . Breast Lift    . COLONOSCOPY WITH PROPOFOL N/A 03/22/2020   Procedure: COLONOSCOPY WITH PROPOFOL;  Surgeon: Milus Banister, MD;  Location: [facility];  Service: Endoscopy;  Laterality: N/A;  . CYSTOSCOPY WITH STENT PLACEMENT Right 12/07/2018   Procedure: CYSTOSCOPY WITH STENT PLACEMENT;  Surgeon: Ardis Hughs, MD;  Location: [facility];  Service: Urology;  Laterality: Right;  . CYSTOSCOPY/URETEROSCOPY/HOLMIUM LASER/STENT PLACEMENT Right 12/31/2018   Procedure: CYSTOSCOPY RIGHT URETEROSCOPY/HOLMIUM LASER RIGHT Thyra Breed;  Surgeon: Ardis Hughs, MD;  Location[facility];  Service: Urology;  Laterality: Right;  . ESOPHAGOGASTRODUODENOSCOPY (EGD)  WITH PROPOFOL N/A 03/22/2020   Procedure: ESOPHAGOGASTRODUODENOSCOPY (EGD) WITH PROPOFOL;  Surgeon: Milus Banister, MD;  Location: [facility];  Service: Endoscopy;  Laterality: N/A;  . REPLACEMENT TOTAL KNEE Bilateral 5 YRS AGO  . Tummy tuck       Current Outpatient Medications on File Prior to Visit  Medication Sig Dispense Refill  . Bismuth Subsalicylate 272 MG TABS Take 2 tablets (524 mg total) by mouth in the morning, at noon, in the evening, and at bedtime. 240 tablet 0  . COVID-19 Specimen Collection KIT See admin instructions. for testing    . lansoprazole (PREVACID SOLUTAB) 30 MG disintegrating tablet Take 1 tablet (30 mg total) by mouth daily before breakfast. 30 tablet 11  . LORazepam (ATIVAN PO) Take 1 tablet by mouth at bedtime.     Marland Kitchen LORAZEPAM INTENSOL 2 MG/ML concentrated solution SMARTSIG:0.5 Milliliter(s) By Mouth Twice Daily PRN    . memantine (NAMENDA) 5 MG tablet Take 5 mg by mouth at bedtime.     . metroNIDAZOLE (FLAGYL) 500 MG tablet Take 1 tablet (500 mg total) by mouth 4 (four) times daily. 120 tablet 0  . mirtazapine (REMERON SOL-TAB) 30 MG disintegrating tablet Take 30 mg by mouth at bedtime.    . mirtazapine (REMERON) 15 MG tablet Take 15 mg by mouth at bedtime.     . mirtazapine (REMERON) 30 MG tablet Take 30 mg by mouth at bedtime.    . Omeprazole 20 MG TBEC Take 1 tablet (20 mg total) by mouth daily. Please take 12 hours after omeprazole $RemoveBeforeDE'40mg'PcRqWpABvijpwJT$  30 tablet   .  tetracycline (SUMYCIN) 500 MG capsule Take 1 capsule (500 mg total) by mouth 4 (four) times daily. 120 capsule 0   Current Facility-Administered Medications on File Prior to Visit  Medication Dose Route Frequency Provider Last Rate Last Admin  . 0.9 %  sodium chloride infusion  500 mL Intravenous Continuous Milus Banister, MD         No Known Allergies   Social History   Occupational History  . Occupation: retired  Tobacco Use  . Smoking status: Former Research scientist (life sciences)  . Smokeless tobacco: Never Used   Vaping Use  . Vaping Use: Never used  Substance and Sexual Activity  . Alcohol use: Yes    Comment: occasional wine  . Drug use: Never  . Sexual activity: Not Currently     Family History  Problem Relation Age of Onset  . Diabetes Mother   . Hypertension Father   . Colon cancer Neg Hx   . Esophageal cancer Neg Hx   . Stomach cancer Neg Hx   . Rectal cancer Neg Hx       There is no immunization history on file for this patient.   Objective: Monica Erickson is a pleasant 74 y.o. female in NAD. AAO 2.  There were no vitals filed for this visit.  Vascular Examination:  Capillary fill time to digits <3 seconds b/l lower extremities. Faintly palpable pedal pulses b/l. Pedal hair sparse. Lower extremity skin temperature gradient within normal limits. No pain with calf compression b/l. No edema noted b/l lower extremities.  Dermatological Examination: Pedal skin with normal turgor, texture and tone bilaterally. No open wounds bilaterally. No interdigital macerations bilaterally. Toenails 1-5 b/l elongated, discolored, dystrophic, thickened, crumbly with subungual debris and tenderness to dorsal palpation.  Musculoskeletal: Normal muscle strength 5/5 to all lower extremity muscle groups bilaterally. Hallux valgus with bunion deformity noted b/l lower extremities.  Neurological: Patient unable to follow commands of LE neurological examination due to cognitive deficits. Patient does respond to external noxious stimuli. Clonus negative b/l.  Assessment: 1. Pain due to onychomycosis of toenails of both feet    Plan: -Examined patient. -Patient to continue soft, supportive shoe gear daily. -Toenails 1-5 b/l were debrided in length and girth with sterile nail nippers and dremel without iatrogenic bleeding.  -Patient to report any pedal injuries to medical professional immediately. -Patient/POA to call should there be question/concern in the interim.  Return in about 3 months (around  12/25/2020).  Marzetta Board, DPM

## 2021-01-04 ENCOUNTER — Ambulatory Visit: Payer: Medicare Other | Admitting: Podiatry

## 2021-01-25 ENCOUNTER — Telehealth: Payer: Self-pay | Admitting: Podiatry

## 2021-01-25 DIAGNOSIS — H2511 Age-related nuclear cataract, right eye: Secondary | ICD-10-CM | POA: Diagnosis not present

## 2021-01-25 DIAGNOSIS — H2512 Age-related nuclear cataract, left eye: Secondary | ICD-10-CM | POA: Diagnosis not present

## 2021-01-25 NOTE — Telephone Encounter (Signed)
Called pt lvm to reschedule 4/8 appt  

## 2021-01-29 ENCOUNTER — Encounter: Payer: Self-pay | Admitting: Podiatry

## 2021-02-15 ENCOUNTER — Ambulatory Visit: Payer: Medicare Other | Admitting: Podiatry

## 2021-02-25 DIAGNOSIS — H2513 Age-related nuclear cataract, bilateral: Secondary | ICD-10-CM | POA: Diagnosis not present

## 2021-02-25 DIAGNOSIS — G309 Alzheimer's disease, unspecified: Secondary | ICD-10-CM | POA: Diagnosis not present

## 2021-02-25 DIAGNOSIS — Z79899 Other long term (current) drug therapy: Secondary | ICD-10-CM | POA: Diagnosis not present

## 2021-02-25 DIAGNOSIS — Z87891 Personal history of nicotine dependence: Secondary | ICD-10-CM | POA: Diagnosis not present

## 2021-02-26 DIAGNOSIS — Z961 Presence of intraocular lens: Secondary | ICD-10-CM | POA: Insufficient documentation

## 2021-03-13 ENCOUNTER — Encounter: Payer: Self-pay | Admitting: Podiatry

## 2021-03-14 NOTE — Telephone Encounter (Signed)
Please give her a call to schedule an appointment.

## 2021-04-05 ENCOUNTER — Ambulatory Visit: Payer: Medicare Other | Admitting: Podiatry

## 2021-07-11 ENCOUNTER — Encounter: Payer: Self-pay | Admitting: Nurse Practitioner

## 2021-07-11 ENCOUNTER — Ambulatory Visit: Payer: Self-pay | Admitting: Nurse Practitioner

## 2021-07-11 ENCOUNTER — Ambulatory Visit (INDEPENDENT_AMBULATORY_CARE_PROVIDER_SITE_OTHER): Payer: Medicare Other | Admitting: Nurse Practitioner

## 2021-07-11 VITALS — BP 110/64 | HR 56 | Ht 68.0 in | Wt 166.1 lb

## 2021-07-11 DIAGNOSIS — K2970 Gastritis, unspecified, without bleeding: Secondary | ICD-10-CM

## 2021-07-11 DIAGNOSIS — B9681 Helicobacter pylori [H. pylori] as the cause of diseases classified elsewhere: Secondary | ICD-10-CM | POA: Diagnosis not present

## 2021-07-11 DIAGNOSIS — R141 Gas pain: Secondary | ICD-10-CM

## 2021-07-11 NOTE — Patient Instructions (Signed)
If you are age 74 or older, your body mass index should be between 23-30. Your Body mass index is 25.26 kg/m. If this is out of the aforementioned range listed, please consider follow up with your Primary Care Provider. __________________________________________________________  The [place] [facility] providers would like to encourage you to use [facility] to communicate with providers for non-urgent requests or questions.  Due to long hold times on the telephone, sending your provider a message by [facility] may be a faster and more efficient way to get a response.  Please allow 48 business hours for a response.  Please remember that this is for non-urgent requests.   Please purchase the following medications over the counter and take as directed:  START: Gaviscon one tablespoon three times daily as needed for upper abdominal pain.  START: Gas X one tablet twice daily as needed.  Thank you for entrusting me with your care and choosing [facility].  Alcide Evener, NP

## 2021-07-11 NOTE — Progress Notes (Signed)
07/11/2021 Monica Erickson [id number] 09/01/1946   Chief Complaint: stomach pain   History of Present Illness: Monica Erickson is a 75 year old female with a history of dementia depression, kidney stones, sleep apnea does not use CPAP and chronic headaches.  She is accompanied by her daughter Monica Erickson.  She underwent an EGD and colonoscopy by Dr. [name] 03/22/2020 due to having abdominal pain and weight loss.  The EGD identified a thin lineal ulcer in the distal duodenal bulb with a slight anatomic deformity and narrowing of the lumen.  Biopsies were positive for H. pylori without evidence of any malignancy.  She was prescribed bismuth, metronidazole, tetracycline and PPI, however, the patient was not willing to swallow these medications therefore her H. pylori treatment regimen was aborted.  She was then prescribed Prevacid SoluTab for gastritis symptoms, however, it sounds like this medication was not affordable and she did not take it.  Her colonoscopy resulted in a poor prep with diverticulosis present at the entire colon.  Her daughter Monica Erickson is concerned her mother complains of stomach pain which typically occurs when family members are present especially when younger children are visiting.  Her mother does not exhibit any nausea or vomiting.  No weight loss, in fact she has gained 20 pounds over the past year which is a good indicator that she is eating well.  Her bowel pattern is unknown as the patient goes to the bathroom independently and flushes without family members observing what type of stool she passes shows evidence of passing a bowel movement every day with some residual stool seen in the toilet.  Currently, Monica Erickson is calm and mostly cooperative.  She did not wish to have her blood pressure taken earlier.  I was able to take a manual blood pressure without causing any distress.  She did not wish to lay down on the exam table her abdominal examination.   CBC Latest Ref Rng & Units 06/12/2020  12/06/2019 12/03/2019  WBC 4.0 - 10.5 K/uL 7.1 5.7 6.1  Hemoglobin 12.0 - 15.0 g/dL 11.3(L) 13.8 13.2  Hematocrit 36.0 - 46.0 % 35.4(L) 40.4 40.1  Platelets 150 - 400 K/uL 175 258 215    CMP Latest Ref Rng & Units 12/06/2019 12/03/2019 01/31/2019  Glucose 70 - 99 mg/dL 974(B) 638(G) 536(I)  BUN 8 - 23 mg/dL 10 6(L) 7(L)  Creatinine 0.44 - 1.00 mg/dL 6.80 3.21 2.24  Sodium 135 - 145 mmol/L 137 139 139  Potassium 3.5 - 5.1 mmol/L 3.1(L) 3.6 3.7  Chloride 98 - 111 mmol/L 104 106 107  CO2 22 - 32 mmol/L 20(L) 22 22  Calcium 8.9 - 10.3 mg/dL 9.5 9.4 9.1  Total Protein 6.5 - 8.1 g/dL 7.3 7.1 7.4  Total Bilirubin 0.3 - 1.2 mg/dL 8.2(N) 0.0(B) 0.5  Alkaline Phos 38 - 126 U/L 68 68 79  AST 15 - 41 U/L 15 14(L) 16  ALT 0 - 44 U/L 8 7 8      EGD 03/22/2020: - Mild, non-specific gastritis in antrum and body. Biopsied. - Thin, benign appearing linear ulcer in the distal duodenal bulb associated with slight anatomic deformity and narrowing of the lumen. I was able to easily advance the adult gastroscope more deeply into the duodenum however. This ulcer probably accounts for her anorexia, weight loss. - The examination was otherwise normal.  FINAL MICROSCOPIC DIAGNOSIS:   A. DUODENAL BULB, ULCER, BIOPSY:  - Peptic duodenitis with Helicobacter pylori.   B. STOMACH, DISTAL, BIOPSY:  -  Chronic active gastritis with Helicobacter pylori.   Colonoscopy [id number]: otherwise without abnormality on direct and retroflexion views. - Preparation of the colon was poor. Clearly there were no large or obstructing tumors however small but significant lesions may have been not visible due to the prep. - Diverticulosis in the entire examined colon. - The examination was otherwise normal on direct and retroflexion views.   Current Medications, Allergies, Past Medical History, Past Surgical History, Family History and Social History were reviewed in [name] record.   Review of  Systems:   Constitutional: Negative for fever, sweats, chills or weight loss.  Respiratory: Negative for shortness of breath.   Cardiovascular: Negative for chest pain, palpitations and leg swelling.  Gastrointestinal: See HPI.  Musculoskeletal: Negative for back pain or muscle aches.  Neurological: Negative for dizziness, headaches or paresthesias.    Physical Exam: Ht 5\' 8"  (1.727 m)   Wt 166 lb 2 oz (75.4 kg)   BMI 25.26 kg/m   Wt Readings from Last 3 Encounters:  07/11/21 166 lb 2 oz (75.4 kg)  06/15/20 156 lb (70.8 kg)  03/21/20 142 lb (64.4 kg)    General: 75 year old female in no acute distress. Head: Normocephalic and atraumatic. Eyes: No scleral icterus. Conjunctiva pink . Ears: Normal auditory acuity. Mouth: Dentition intact. No ulcers or lesions.  Lungs: Clear throughout to auscultation. Heart: Regular rate and rhythm, no murmur. Abdomen: Soft, nontender and nondistended. No masses or hepatomegaly. Normal bowel sounds x 4 quadrants.  Rectal: Furred. Musculoskeletal: Symmetrical with no gross deformities. Extremities: No edema. Neurological: Alert oriented x 4. No focal deficits.  Psychological: Alert and cooperative. Normal mood and affect  Assessment and Recommendations:  50) 75 year old female with H. pylori gastritis per EGD, patient was noncompliant with taking the prescribed H. pylori regimen, did not wish to swallow capsules.  She complains of intermittent vague upper abdominal pain when various family members are visiting.  No abdominal pain at this time.  No weight loss, in fact she has gained 20 pounds over the past year which is reassuring. -CTAP deferred as it is unlikely the patient would delay still in the CT scanner and her abdominal exam today is negative. Past abdominal CT scans were negative. -Consider Amoxicillin and Clarithromycin oral suspensions/pepto bismol liquid with Nexium granules for H. Pylori tx verses no treatment as it is still  questionable if the patient would be compliant with taking liquid medications there is a risk she could have an adverse reaction to the antibiotics.  Await further recommendations per Dr. 66 -Gaviscon 1 tbsp po tid PRN -Gas X -Follow up as needed   2) Advanced dementia

## 2021-07-13 NOTE — Progress Notes (Signed)
I agree with the above note, plan.  Agree that there is probably no point to represcribing h. Pylori treatment. She is clearly not withering given her 20 pound weight gain.

## 2021-07-16 NOTE — Progress Notes (Signed)
I spoke to the patient's daughter, [name] and discussed Dr. Larae Grooms recommendations not to tx H. Pylori at this time. [name] stated her mother was feeling much better since her office appointment without any further complaints of stomach/abdominal pain. [name] will call our office if she has further concerns or if her mother's abd pain recurs or if she starts loosing weight.

## 2021-08-29 IMAGING — DX DG CHEST 2V
2 series · 2 of 2 positions shown · non-contrast
Comparison: Chest radiographs 01/31/2019 and earlier. Recent CT
Abdomen and Pelvis 12/03/2019.

CLINICAL DATA: 73-year-old female with epigastric abdominal pain,
loss of appetite.

EXAM:
CHEST - 2 VIEW

[chest lat]
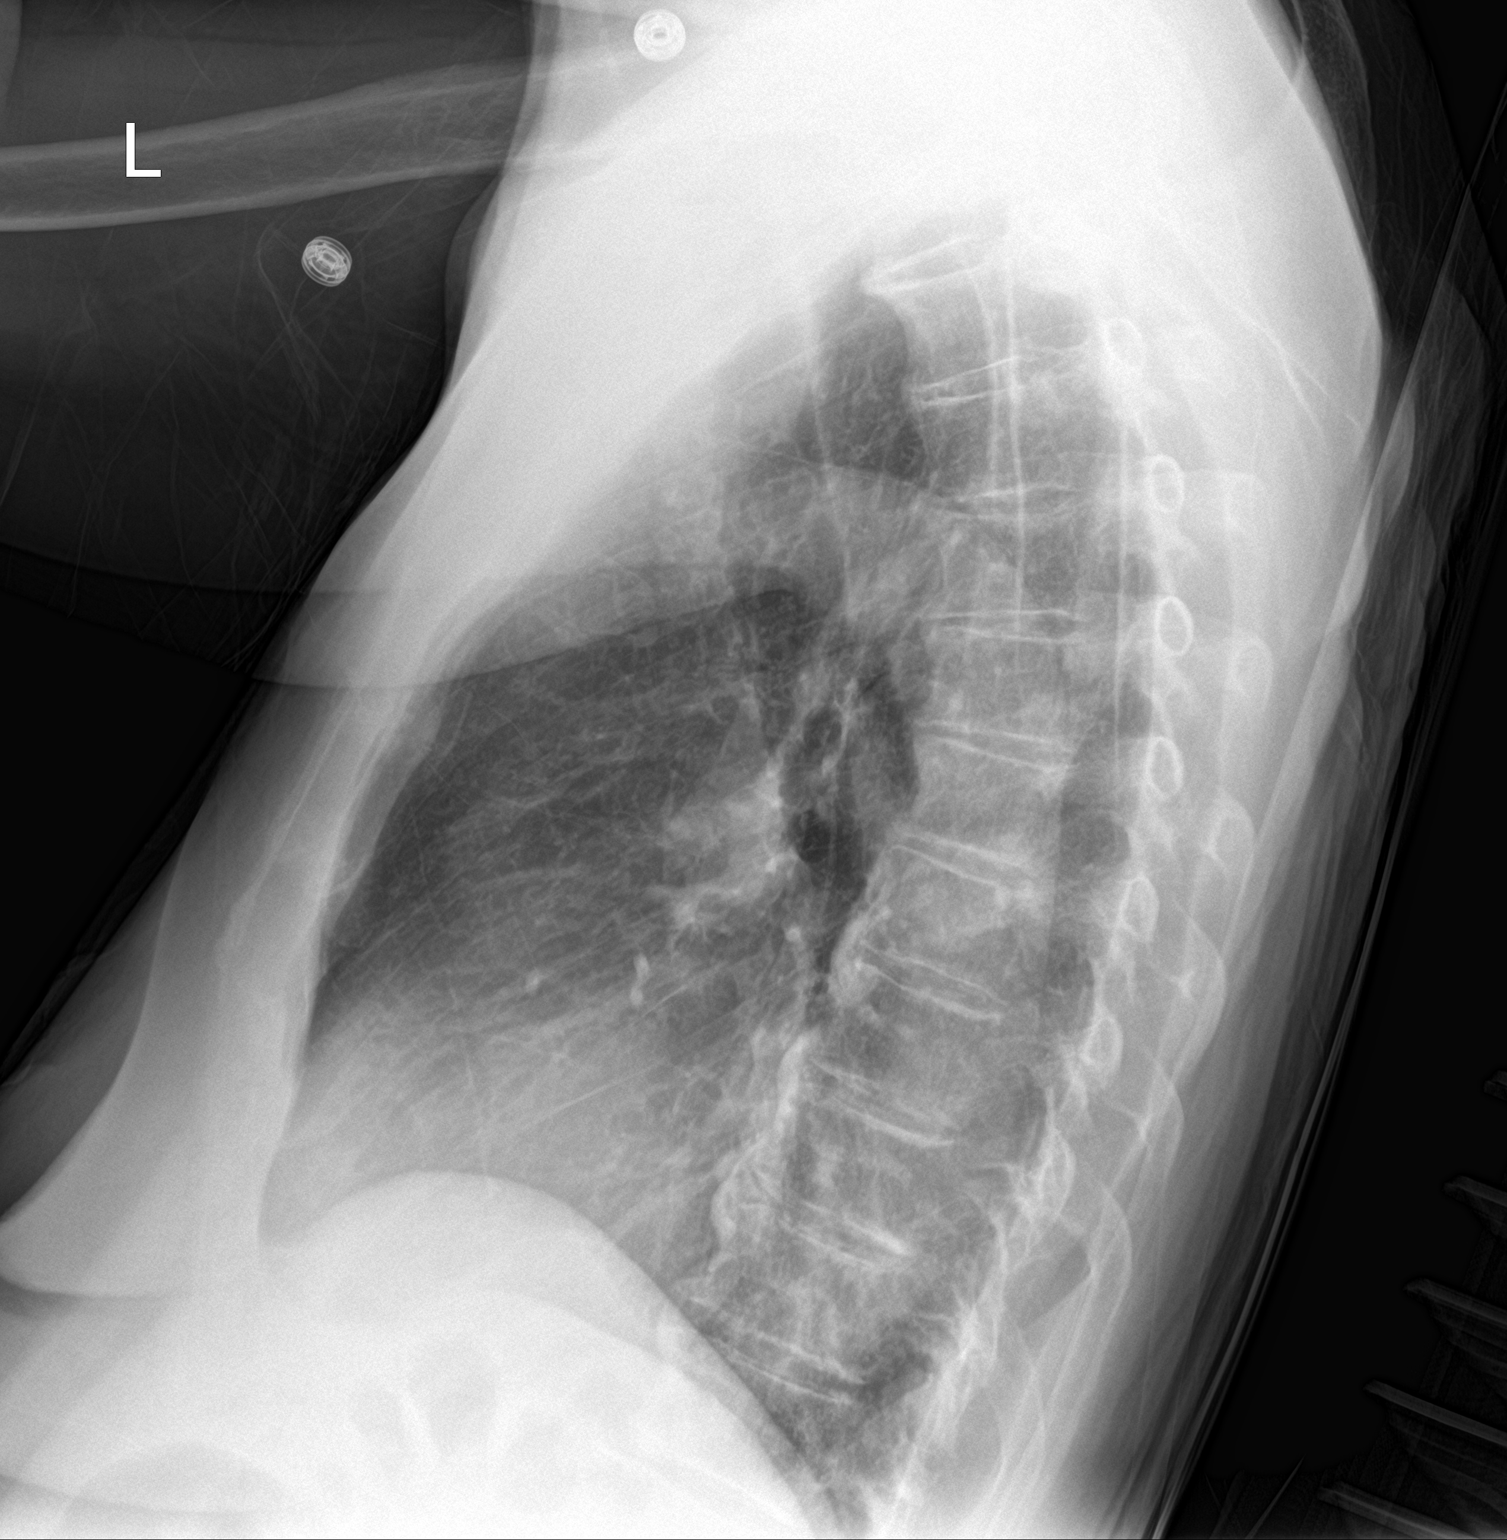

[chest ap]
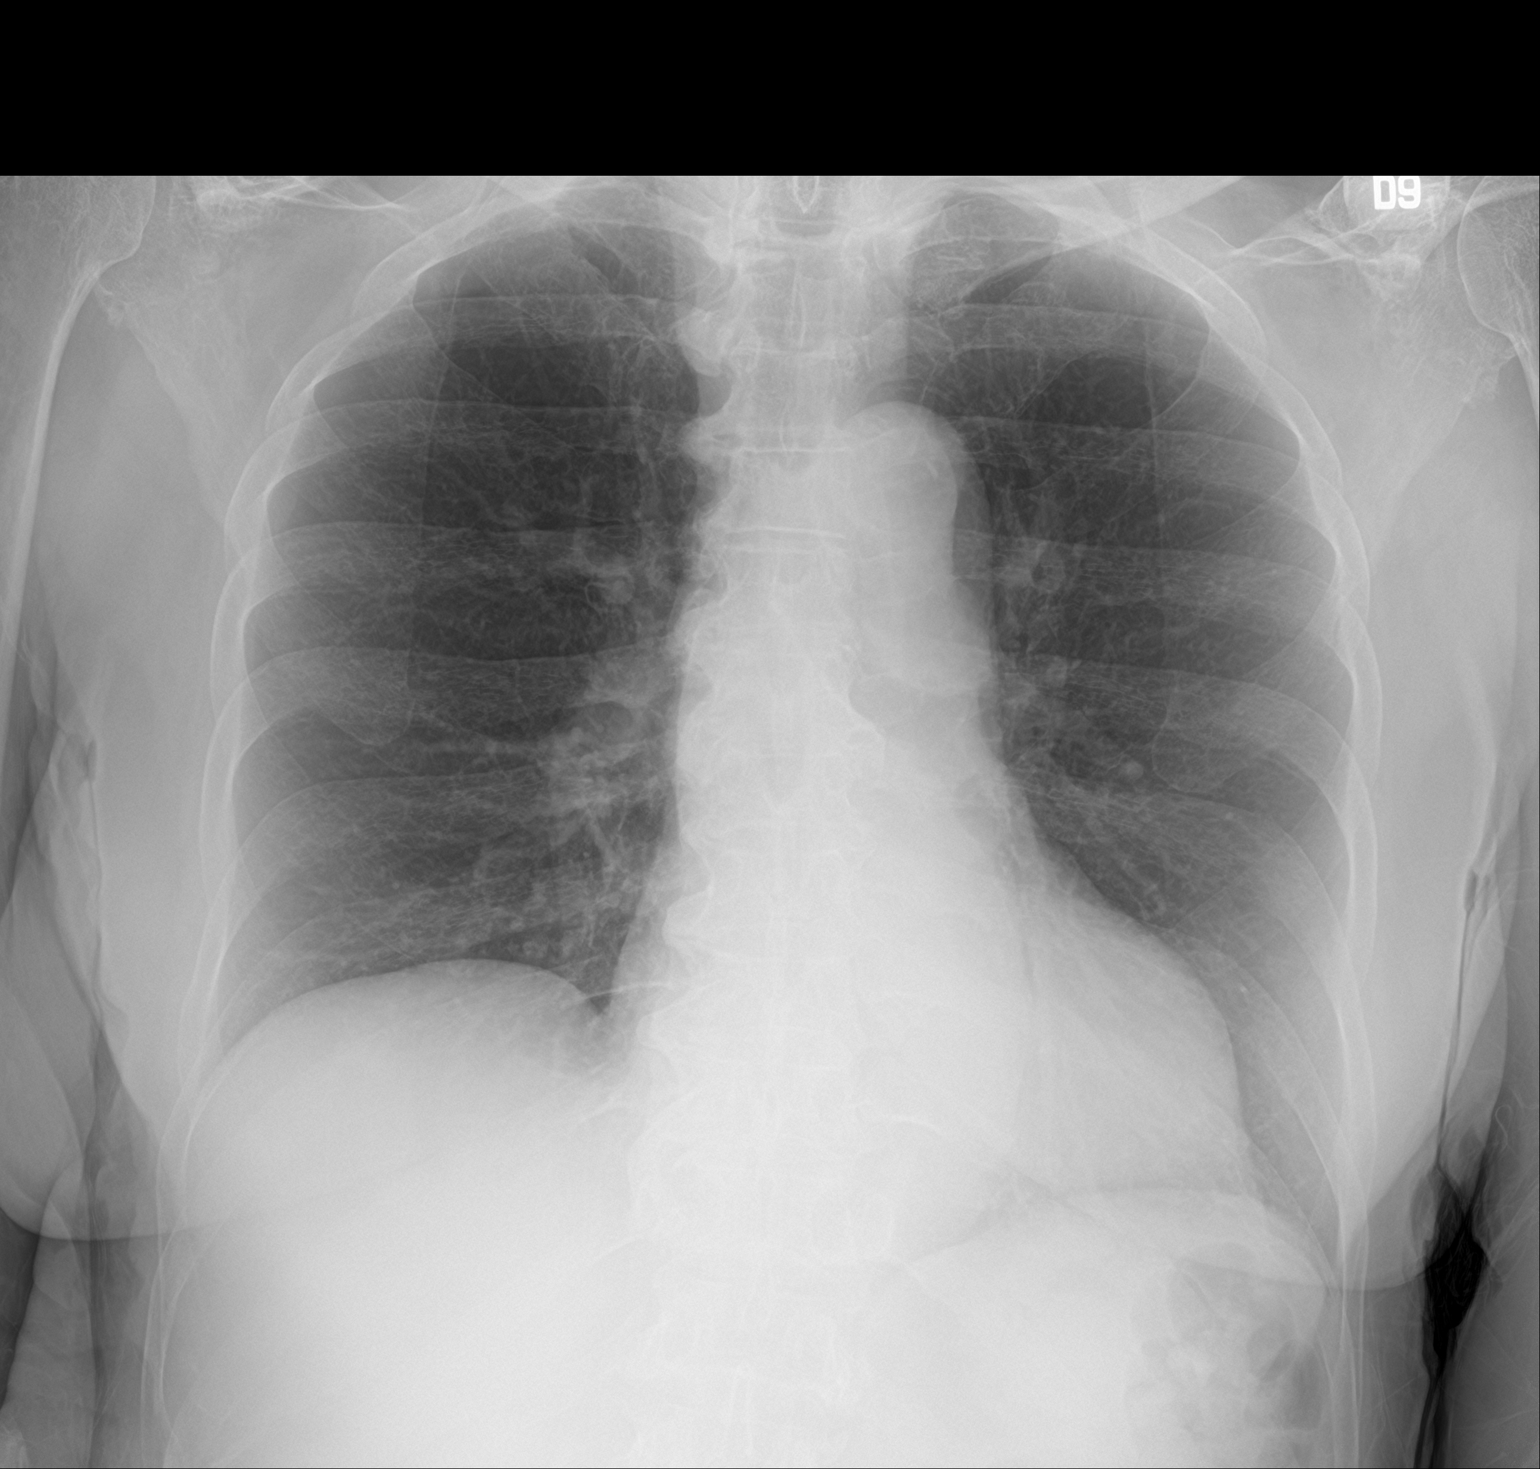

[2 of 2 positions shown; findings below may reference images not displayed]

FINDINGS: Upright AP and lateral views of the chest. Tortuous descending
thoracic aorta. Stable cardiac size and mediastinal contours.
Visualized tracheal air column is within normal limits. Both lungs
appear clear. No pneumothorax or pleural effusion.

No acute osseous abnormality identified. Negative visible bowel gas
pattern.
IMPRESSION: No acute cardiopulmonary abnormality.

## 2021-10-05 ENCOUNTER — Other Ambulatory Visit: Payer: Self-pay

## 2021-10-05 ENCOUNTER — Emergency Department (HOSPITAL_COMMUNITY)
Admission: EM | Admit: 2021-10-05 | Discharge: 2021-10-05 | Disposition: A | Discharge status: Home / Self Care | Payer: Medicare Other | Attending: Emergency Medicine | Admitting: Emergency Medicine

## 2021-10-05 DIAGNOSIS — R109 Unspecified abdominal pain: Secondary | ICD-10-CM | POA: Insufficient documentation

## 2021-10-05 DIAGNOSIS — Z5321 Procedure and treatment not carried out due to patient leaving prior to being seen by health care provider: Secondary | ICD-10-CM | POA: Diagnosis not present

## 2021-10-05 NOTE — ED Notes (Signed)
Pt not responding for triage  

## 2021-12-03 ENCOUNTER — Ambulatory Visit: Payer: Medicare Other | Admitting: Physician Assistant

## 2021-12-09 ENCOUNTER — Ambulatory Visit: Payer: Medicare Other | Admitting: Nurse Practitioner

## 2021-12-09 ENCOUNTER — Other Ambulatory Visit: Payer: Self-pay

## 2021-12-09 ENCOUNTER — Other Ambulatory Visit: Payer: Medicare Other

## 2021-12-09 ENCOUNTER — Ambulatory Visit (INDEPENDENT_AMBULATORY_CARE_PROVIDER_SITE_OTHER)
Admission: RE | Admit: 2021-12-09 | Discharge: 2021-12-09 | Disposition: A | Discharge status: Home / Self Care | Payer: Medicare Other | Source: Ambulatory Visit | Attending: Nurse Practitioner | Admitting: Nurse Practitioner

## 2021-12-09 ENCOUNTER — Encounter: Payer: Self-pay | Admitting: Nurse Practitioner

## 2021-12-09 VITALS — BP 110/70 | HR 87 | Ht 68.0 in | Wt 155.0 lb

## 2021-12-09 DIAGNOSIS — R1084 Generalized abdominal pain: Secondary | ICD-10-CM | POA: Diagnosis not present

## 2021-12-09 DIAGNOSIS — R1030 Lower abdominal pain, unspecified: Secondary | ICD-10-CM | POA: Diagnosis not present

## 2021-12-09 DIAGNOSIS — K5900 Constipation, unspecified: Secondary | ICD-10-CM

## 2021-12-09 NOTE — Patient Instructions (Signed)
LABS:  Lab work has been ordered for you today. Our lab is located in the basement. Press "B" on the elevator. The lab is located at the first door on the left as you exit the elevator.  HEALTHCARE LAWS AND MY CHART RESULTS:  Due to recent changes in healthcare laws, you may see the results of your imaging and laboratory studies on MyChart before your provider has had a chance to review them.   We understand that in some cases there may be results that are confusing or concerning to you. Not all laboratory results come back in the same time frame and the provider may be waiting for multiple results in order to interpret others.  Please give [place] 48 hours in order for your provider to thoroughly review all the results before contacting the office for clarification of your results.   IMAGING Your provider has requested that you have an abdominal x [place] before leaving today. Please go to the basement floor to our Radiology department for the test.  RECOMMENDATIONS:  Miralax- Dissolve one capful in 8 ounces of water and drink before bed. Push your fluid intake. Contact your primary care provider today regarding low blood pressure and high heart rate.  It was great seeing you today! Thank you for entrusting me with your care and choosing [facility].  Noralyn Pick, CRNP The [place] [facility] providers would like to encourage you to use [facility] to communicate with providers for non-urgent requests or questions.  Due to long hold times on the telephone, sending your provider a message by [facility] may be faster and more efficient way to get a response. Please allow 48 business hours for a response.  Please remember that this is for non-urgent requests/questions.  If you are age 62 or older, your body mass index should be between 23-30. Your Body mass index is 23.57 kg/m. If this is out of the aforementioned range listed, please consider follow up with your Primary Care Provider.  If you  are age 53 or younger, your body mass index should be between 19-25. Your Body mass index is 23.57 kg/m. If this is out of the aformentioned range listed, please consider follow up with your Primary Care Provider.

## 2021-12-09 NOTE — Progress Notes (Signed)
I agree with the above note, plan 

## 2021-12-09 NOTE — Progress Notes (Signed)
12/09/2021 Monica Erickson [id number] 02/14/1946   Chief Complaint: Abdominal pain  History of Present Illness: Monica Erickson is a 76 year old female with a history of dementia depression, kidney stones, sleep apnea does not use CPAP and chronic headaches.  She is accompanied by her daughter Monica Erickson who is concerned her mother is having abdominal pain.  She stated her mother was doubled over in pain earlier this morning as witnessed by her sister.  At that time, Monica Erickson was the toilet with stool on her hand and on the floor.  Monica Erickson was concerned her mother was perhaps really constipated and digging out the stool from her bottom.  She is not drinking much water or fluids.  She takes a few bites of food then hides the rest of her meal in her room.  No weight loss.  Monica Erickson has not demonstrated any nausea or vomiting.  As previously reviewed, she underwent an EGD and colonoscopy by Dr. [name] 03/22/2020 due to having abdominal pain and weight loss.  The EGD identified a thin lineal ulcer in the distal duodenal bulb with a slight anatomic deformity and narrowing of the lumen.  Biopsies were positive for H. pylori without evidence of any malignancy.  She was unwilling to swallow the prescribed H. pylori treatment therefore H. pylori remains untreated.  Colonoscopy was done on the same date which resulted in a poor prep and showed diverticulosis throughout the colon.  Repeat colonoscopy was not recommended due to her progressive dementia.  Current Outpatient Medications on File Prior to Visit  Medication Sig Dispense Refill   calcium carbonate (TUMS - DOSED IN MG ELEMENTAL CALCIUM) 500 MG chewable tablet Chew 1 tablet by mouth daily.     simethicone (MYLICON) 125 MG chewable tablet Chew 125 mg by mouth every 6 (six) hours as needed for flatulence.     No current facility-administered medications on file prior to visit.   Allergies  Allergen Reactions   Tums [Calcium Carbonate]    Current Medications,  Allergies, Past Medical History, Past Surgical History, Family History and Social History were reviewed in [name] record.  Review of Systems:   Constitutional: Negative for fever, sweats, chills or weight loss.  Respiratory: Negative for shortness of breath.   Cardiovascular: Negative for chest pain, palpitations and leg swelling.  Gastrointestinal: See HPI.  Musculoskeletal: Negative for back pain or muscle aches.  Neurological: Negative for dizziness, headaches or paresthesias.   Physical Exam: BP 110/70    Pulse 87    Ht 5\' 8"  (1.727 m)    Wt 155 lb (70.3 kg)    SpO2 99%    BMI 23.57 kg/m   Wt Readings from Last 3 Encounters:  12/09/21 155 lb (70.3 kg)  07/11/21 166 lb 2 oz (75.4 kg)  06/15/20 156 lb (70.8 kg)   \BP 110/70    Pulse 87    Ht 5\' 8"  (1.727 m)    Wt 155 lb (70.3 kg)    SpO2 99%    BMI 23.57 kg/m   General: 76 year old female in no acute distress. Head: Normocephalic and atraumatic. Eyes: No scleral icterus. Conjunctiva pink . Ears: Normal auditory acuity. Mouth: Dentition intact. No ulcers or lesions.  Lungs: Clear throughout to auscultation. Heart: Regular rate and rhythm, no murmur. Abdomen: Soft, nontender and nondistended. No masses or hepatomegaly. Normal bowel sounds x 4 quadrants.  Note, limited abdominal exam is a patient was not willing to lay in the supine position for exam.  Rectal: Deferred. Musculoskeletal: Symmetrical with no gross deformities. Extremities: No edema. Neurological: Alert oriented x 4. No focal deficits.  Psychological: Alert and cooperative. Normal mood and affect  Assessment and Recommendations:  92) 76 year old female with progressive dementia who demonstrates signs of generalized abdominal pain by laying in the fetal position and rocking back and forth when sitting on the commode as witnessed by her 2 daughters.  Patient found in the bathroom today with stool on her hand and on the bathroom floor.  She is  in no acute distress at this time, no obvious signs of abdominal pain/distress.  No weight loss. -KUB to rule out constipation -If the KUB shows a significant amount of stool burden, will recommend MiraLAX as tolerated -Her abdominal exam today was limited but no obvious tenderness or palpable mass was identified.  It is unlikely she would tolerate a CTAP unless she was sedated which is not recommended at this point. -Patient to go to the ED if she develops severe abdominal pain/distress  2) Initial heart rate today was in the low 100s with systolic blood pressure in the 90s.  I suspect she is somewhat dehydrated with decreased fluid intake as reported by her daughter.  Repeat heart rate 87 with blood pressure 110/70 prior to discharge home. -Push fluid -Daughter Monica Erickson to contact PCP  for follow up regarding patient's elevated heart rate and low blood pressure results  3) History of H. pylori gastritis per EGD, not treated as patient was unwilling to swallow the prescribed H. pylori antibiotic regimen

## 2021-12-20 ENCOUNTER — Ambulatory Visit: Payer: Self-pay | Admitting: Nurse Practitioner

## 2021-12-23 ENCOUNTER — Encounter: Payer: Self-pay | Admitting: Nurse Practitioner

## 2021-12-23 ENCOUNTER — Ambulatory Visit (INDEPENDENT_AMBULATORY_CARE_PROVIDER_SITE_OTHER): Payer: Medicare Other | Admitting: Nurse Practitioner

## 2021-12-23 ENCOUNTER — Other Ambulatory Visit: Payer: Self-pay

## 2021-12-23 VITALS — BP 118/77 | HR 84 | Temp 97.6°F | Ht 68.0 in | Wt 154.6 lb

## 2021-12-23 DIAGNOSIS — F03911 Unspecified dementia, unspecified severity, with agitation: Secondary | ICD-10-CM

## 2021-12-23 DIAGNOSIS — K5900 Constipation, unspecified: Secondary | ICD-10-CM | POA: Diagnosis not present

## 2021-12-23 DIAGNOSIS — R630 Anorexia: Secondary | ICD-10-CM

## 2021-12-23 DIAGNOSIS — R1084 Generalized abdominal pain: Secondary | ICD-10-CM

## 2021-12-23 DIAGNOSIS — R634 Abnormal weight loss: Secondary | ICD-10-CM

## 2021-12-23 DIAGNOSIS — F0390 Unspecified dementia without behavioral disturbance: Secondary | ICD-10-CM

## 2021-12-23 MED ORDER — MIRTAZAPINE 15 MG PO TBDP: 15.0000 mg | ORAL_TABLET | Freq: Every day | ORAL | 2 refills | Status: DC

## 2021-12-23 MED ORDER — FLUTICASONE PROPIONATE 50 MCG/ACT NA SUSP: 2.0000 | Freq: Every day | NASAL | 6 refills | Status: DC

## 2021-12-23 MED ORDER — LACTULOSE 10 G PO PACK: 10.0000 g | PACK | ORAL | 0 refills | Status: DC

## 2021-12-23 NOTE — Progress Notes (Signed)
[name], [place]  [address] Phone:  [phone]   Fax:  [phone]   Established Patient Office Visit  Subjective:  Patient ID: Monica Erickson, female    DOB: 29-Apr-1946  Age: 76 y.o. MRN: [id number]  CC:  Chief Complaint  Patient presents with   Follow-up    Pt is here today with her daughter for follow up for limited food intake. Pt daughter states that the patient has not been eating or drinking enough. Pt was seen with the GI provider and was never told her imaging results back on 12/10/21. Pt daughter states that she is still having abdominal pains and is not sleeping and gets very agitated. Pt has taken valium and Ambien in the past that has worked very well for her. Pt daughter states that she needs a prescription for a laxative.    HPI Monica Erickson presents for follow up. She  has a past medical history of Chronic headaches, Dementia ([place]), Depression, Difficult intravenous access (2021), Gastric ulcer, H. pylori infection (03/2020), Kidney stones, Night terrors, and Sleep apnea.   Abdominal Pain Patient complains of abdominal pain. The pain is described as aching, and is unable to verbalize intensity. The patient is experiencing generalized pain without radiation. Onset was several years ago. Symptoms have been gradually worsening. Aggravating factors: bowel movement.  Alleviating factors: none. Associated symptoms: belching and constipation. The patient daughter denies chills, diarrhea, dysuria, fever, hematochezia, hematuria, nausea, and vomiting. She was having problems with impaction. She tired miralax however this was ineffective. She has taken Gas X and Tums. She is eating once a day or less. She has protein shakes and oatmeal. The request is something sweet in liquid form.   She is unable to communicate. She was not able to get her labs with GI at last visit. She is not sleeping well. She is getting agitated.  She gets tearful.  CBD gummies have not been effective. The daughter reports patient is not naive  to medications. Ongoing compliance maybe an issues.   Home health again is requested as well.  Past Medical History:  Diagnosis Date   Chronic headaches    Dementia ([place])    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 pylori infection 03/2020   Kidney stones    Night terrors    Sleep apnea    no cpap    Past Surgical History:  Procedure Laterality Date   BIOPSY  03/22/2020   Procedure: BIOPSY;  Surgeon: Milus Banister, MD;  Location: [facility];  Service: Endoscopy;;   Breast Lift     CATARACT EXTRACTION Bilateral 01/2020   COLONOSCOPY WITH PROPOFOL N/A 03/22/2020   Procedure: COLONOSCOPY WITH PROPOFOL;  Surgeon: Milus Banister, MD;  Location: [facility];  Service: Endoscopy;  Laterality: N/A;   CYSTOSCOPY WITH STENT PLACEMENT Right 12/07/2018   Procedure: CYSTOSCOPY WITH STENT PLACEMENT;  Surgeon: Ardis Hughs, MD;  Location: [facility];  Service: Urology;  Laterality: Right;   CYSTOSCOPY/URETEROSCOPY/HOLMIUM LASER/STENT PLACEMENT Right 12/31/2018   Procedure: CYSTOSCOPY RIGHT URETEROSCOPY/HOLMIUM LASER RIGHT Thyra Breed;  Surgeon: Ardis Hughs, MD;  Location: [facility];  Service: Urology;  Laterality: Right;   ESOPHAGOGASTRODUODENOSCOPY (EGD) WITH PROPOFOL N/A 03/22/2020   Procedure: ESOPHAGOGASTRODUODENOSCOPY (EGD) WITH PROPOFOL;  Surgeon: Milus Banister, MD;  Location: [facility];  Service: Endoscopy;  Laterality: N/A;   REPLACEMENT TOTAL KNEE Bilateral 5 YRS AGO   Tummy tuck      Family History  Problem Relation Age of Onset   Diabetes Mother    Hypertension Father    Colon cancer Neg Hx    Esophageal cancer Neg Hx    Stomach cancer Neg Hx    Rectal cancer Neg Hx     Social History   Socioeconomic History   Marital status: Single    Spouse  name: Not on file   Number of children: 3   Years of education: Not on file   Highest education level: Not on file  Occupational History   Occupation: retired  Tobacco Use   Smoking status: Former   Smokeless tobacco: Never  Scientific laboratory technician Use: Never used  Substance and Sexual Activity   Alcohol use: Not Currently    Comment: occasional wine   Drug use: Never   Sexual activity: Not Currently  Other Topics Concern   Not on file  Social History Narrative   Not on file   Social Determinants of Health   Financial Resource Strain: Not on file  Food Insecurity: Not on file  Transportation Needs: Not on file  Physical Activity: Not on file  Stress: Not on file  Social Connections: Not on file  Intimate Partner Violence: Not on file    Outpatient Medications Prior to Visit  Medication Sig Dispense Refill   calcium carbonate (TUMS - DOSED IN MG ELEMENTAL CALCIUM) 500 MG chewable tablet Chew 1 tablet by mouth daily.     simethicone (MYLICON) 0000000 MG chewable tablet Chew 125 mg by mouth every 6 (six) hours as needed for flatulence.     No facility-administered medications prior to visit.    No Active Allergies  ROS Review of Systems    Objective:    Physical Exam HENT:     Head: Normocephalic and atraumatic.  Cardiovascular:     Rate and Rhythm: Normal rate and regular rhythm.     Pulses: Normal pulses.     Heart sounds: Normal heart sounds.  Pulmonary:     Effort: Pulmonary effort is normal.     Breath sounds: Normal breath sounds.  Abdominal:     General: Abdomen is flat.     Comments: No grimacing with palpation  Musculoskeletal:     Cervical back: Normal range of motion.     Right lower leg: No edema.     Left lower leg: No edema.  Skin:    Capillary Refill: Capillary refill takes less than 2 seconds.  Neurological:     General: No focal deficit present.     Mental Status: She is oriented to person, place, and time.    BP 118/77    Pulse 84    Temp  97.6 F (36.4 C)    Ht 5\' 8"  (1.727 m)    Wt 154 lb 9.6 oz (70.1 kg)    SpO2 100%    BMI 23.51 kg/m  Wt Readings from Last 3 Encounters:  12/23/21 154 lb 9.6 oz (70.1 kg)  12/09/21 155 lb (70.3 kg)  07/11/21 166 lb 2 oz (75.4 kg)     Health Maintenance Due  Topic Date Due   Hepatitis C Screening  Never done   TETANUS/TDAP  Never done   Zoster Vaccines- Shingrix (1 of 2) Never done   MAMMOGRAM  Never done   Pneumonia Vaccine 101+ Years old (  1 - PCV) Never done   DEXA SCAN  Never done   INFLUENZA VACCINE  Never done    There are no preventive care reminders to display for this patient.  No results found for: TSH Lab Results  Component Value Date   WBC 7.1 06/12/2020   HGB 11.3 (L) 06/12/2020   HCT 35.4 (L) 06/12/2020   MCV 94.1 06/12/2020   PLT 175 06/12/2020   Lab Results  Component Value Date   NA 137 12/06/2019   K 3.1 (L) 12/06/2019   CO2 20 (L) 12/06/2019   GLUCOSE 127 (H) 12/06/2019   BUN 10 12/06/2019   CREATININE 0.94 12/06/2019   BILITOT 1.4 (H) 12/06/2019   ALKPHOS 68 12/06/2019   AST 15 12/06/2019   ALT 8 12/06/2019   PROT 7.3 12/06/2019   ALBUMIN 4.0 12/06/2019   CALCIUM 9.5 12/06/2019   ANIONGAP 13 12/06/2019   No results found for: CHOL No results found for: HDL No results found for: LDLCALC No results found for: TRIG No results found for: CHOLHDL Lab Results  Component Value Date   HGBA1C 5.4 12/15/2019      Assessment & Plan:   Problem List Items Addressed This Visit       Nervous and Auditory   Dementia without behavioral disturbance (HCC) - Primary (Chronic) Worsening  Not interested in medical treatment at this time.    Relevant Medications   mirtazapine (REMERON SOL-TAB) 15 MG disintegrating tablet     Other   Generalized abdominal pain Persistent  Hx of constipation  Trial lactulose weekly as directed    Relevant Orders   CT ABDOMEN PELVIS W WO CONTRAST   Poor appetite Started Mirtazapine maybe effective for sleep,  agitation and appetite    Relevant Orders   Ambulatory referral to [place]   Weight loss   Relevant Orders   Ambulatory referral to [place] ordered this encounter  Medications   lactulose (CEPHULAC) 10 g packet    Sig: Take 1 packet (10 g total) by mouth once a week.    Dispense:  15 each    Refill:  0    Order Specific Question:   Supervising Provider    Answer:   Tresa Garter [id number]   mirtazapine (REMERON SOL-TAB) 15 MG disintegrating tablet    Sig: Take 1 tablet (15 mg total) by mouth at bedtime.    Dispense:  30 tablet    Refill:  2    Order Specific Question:   Supervising Provider    Answer:   Tresa Garter [id number]   fluticasone (FLONASE) 50 MCG/ACT nasal spray    Sig: Place 2 sprays into both nostrils daily.    Dispense:  16 g    Refill:  6    Order Specific Question:   Supervising Provider    Answer:   Tresa Garter [id number]    Follow-up: Return in about 4 weeks (around 01/20/2022) for virtual visit.    Vevelyn Francois, NP

## 2021-12-24 ENCOUNTER — Encounter: Payer: Self-pay | Admitting: Nurse Practitioner

## 2021-12-27 ENCOUNTER — Other Ambulatory Visit: Payer: Self-pay | Admitting: Nurse Practitioner

## 2021-12-27 MED ORDER — LACTULOSE 20 GM/30ML PO SOLN: 10.0000 g | ORAL | 0 refills | Status: DC

## 2022-01-22 ENCOUNTER — Telehealth: Payer: Self-pay

## 2022-01-22 NOTE — Telephone Encounter (Signed)
No additional notes needed  

## 2022-01-23 ENCOUNTER — Telehealth: Payer: Medicare Other | Admitting: Nurse Practitioner

## 2022-01-23 ENCOUNTER — Other Ambulatory Visit: Payer: Self-pay | Admitting: Nurse Practitioner

## 2022-01-23 ENCOUNTER — Other Ambulatory Visit: Payer: Self-pay

## 2022-01-23 NOTE — Progress Notes (Unsigned)
   [facility] [address] [place], [place]  [address] Phone:  [phone]   Fax:  [phone] 

## 2022-02-11 ENCOUNTER — Encounter (HOSPITAL_COMMUNITY): Payer: Self-pay

## 2022-02-11 ENCOUNTER — Emergency Department (HOSPITAL_COMMUNITY)
Admission: EM | Admit: 2022-02-11 | Discharge: 2022-02-12 | Disposition: A | Discharge status: Home / Self Care | Payer: Medicare Other | Attending: Emergency Medicine | Admitting: Emergency Medicine

## 2022-02-11 ENCOUNTER — Other Ambulatory Visit: Payer: Self-pay

## 2022-02-11 DIAGNOSIS — R630 Anorexia: Secondary | ICD-10-CM | POA: Insufficient documentation

## 2022-02-11 DIAGNOSIS — F0390 Unspecified dementia without behavioral disturbance: Secondary | ICD-10-CM

## 2022-02-11 DIAGNOSIS — G309 Alzheimer's disease, unspecified: Secondary | ICD-10-CM | POA: Diagnosis not present

## 2022-02-11 DIAGNOSIS — F0280 Dementia in other diseases classified elsewhere without behavioral disturbance: Secondary | ICD-10-CM | POA: Insufficient documentation

## 2022-02-11 DIAGNOSIS — N3000 Acute cystitis without hematuria: Secondary | ICD-10-CM

## 2022-02-11 DIAGNOSIS — R109 Unspecified abdominal pain: Secondary | ICD-10-CM | POA: Diagnosis not present

## 2022-02-11 LAB — COMPREHENSIVE METABOLIC PANEL
ALT: 8 U/L (ref 0–44)
AST: 19 U/L (ref 15–41)
Albumin: 3.8 g/dL (ref 3.5–5.0)
Alkaline Phosphatase: 61 U/L (ref 38–126)
Anion gap: 11 (ref 5–15)
BUN: 7 mg/dL — ABNORMAL LOW (ref 8–23)
CO2: 24 mmol/L (ref 22–32)
Calcium: 9.6 mg/dL (ref 8.9–10.3)
Chloride: 104 mmol/L (ref 98–111)
Creatinine, Ser: 0.96 mg/dL (ref 0.44–1.00)
GFR, Estimated: >60 mL/min (ref >60)
Glucose, Bld: 121 mg/dL — ABNORMAL HIGH (ref 70–99)
Potassium: 3.8 mmol/L (ref 3.5–5.1)
Sodium: 139 mmol/L (ref 135–145)
Total Bilirubin: 0.9 mg/dL (ref 0.3–1.2)
Total Protein: 7.2 g/dL (ref 6.5–8.1)

## 2022-02-11 LAB — CBC WITH DIFFERENTIAL/PLATELET
Abs Immature Granulocytes: 0.01 10*3/uL (ref 0.00–0.07)
Basophils Absolute: 0 10*3/uL (ref 0.0–0.1)
Basophils Relative: 1 %
Eosinophils Absolute: 0 10*3/uL (ref 0.0–0.5)
Eosinophils Relative: 0 %
HCT: 40 % (ref 36.0–46.0)
Hemoglobin: 12.8 g/dL (ref 12.0–15.0)
Immature Granulocytes: 0 %
Lymphocytes Relative: 20 %
Lymphs Abs: 1 10*3/uL (ref 0.7–4.0)
MCH: 28.8 pg (ref 26.0–34.0)
MCHC: 32 g/dL (ref 30.0–36.0)
MCV: 89.9 fL (ref 80.0–100.0)
Monocytes Absolute: 0.2 10*3/uL (ref 0.1–1.0)
Monocytes Relative: 5 %
Neutro Abs: 3.8 10*3/uL (ref 1.7–7.7)
Neutrophils Relative %: 74 %
Platelets: 259 10*3/uL (ref 150–400)
RBC: 4.45 MIL/uL (ref 3.87–5.11)
RDW: 15.2 % (ref 11.5–15.5)
WBC: 5.1 10*3/uL (ref 4.0–10.5)
nRBC: 0 % (ref 0.0–0.2)

## 2022-02-11 LAB — LIPASE, BLOOD: Lipase: 35 U/L (ref 11–51)

## 2022-02-11 MED ORDER — LORAZEPAM 2 MG/ML IJ SOLN
0.5000 mg | Freq: Once | INTRAMUSCULAR | Status: AC
  Administered 2022-02-12: 0.5 mg via INTRAVENOUS
  Filled 2022-02-11: qty 1

## 2022-02-11 NOTE — ED Notes (Signed)
Pt unable to complete Ct scan due to agitation. MD notified - medication ordered for ct scan.  ?

## 2022-02-11 NOTE — ED Provider Notes (Signed)
?[facility] EMERGENCY DEPARTMENT ?Provider Note ? ? ?CSN: [id number] ?Arrival date & time: 02/11/22  1532 ? ?  ? ?History ? ?Chief Complaint  ?Patient presents with  ? Failure To Thrive  ? ? ?Monica Erickson is a 76 y.o. female. ? ?76 year old female with prior medical history as detailed below who presents for evaluation.  Patient with history of Alzheimer's dementia.  Per patient's daughter, patient has had decreased p.o. intake for the last 2-1/2 weeks.  Patient is refusing to eat.  Patient apparently will take some food into her mouth and spit it out.  Patient's PCP has been informed of these issues. ? ?Occasionally, the patient will also have apparent stomach discomfort and will collect her stomach and hunch over. ? ?Upon evaluation in the ED the patient is comfortable.  She denies any specific complaint.   ? ?Of note, patient's daughter is not at bedside in the ED.  Phone contact attempted - message left.  ? ? ?The history is provided by the patient and medical records.  ?Illness ?Location:  Decreased p.o. intake, apparent intermittent abdominal discomfort ?Severity:  Mild ?Onset quality:  Gradual ?Duration:  2 weeks ?Timing:  Intermittent ?Progression:  Waxing and waning ?Chronicity:  New ? ?  ? ?Home Medications ?Prior to Admission medications   ?Medication Sig Start Date End Date Taking? Authorizing Provider  ?calcium carbonate (TUMS - DOSED IN MG ELEMENTAL CALCIUM) 500 MG chewable tablet Chew 1 tablet by mouth daily.    [provider]  ?fluticasone (FLONASE) 50 MCG/ACT nasal spray Place 2 sprays into both nostrils daily. 12/23/21   [name], NP  ?Lactulose 20 GM/30ML SOLN Take 15 mLs (10 g total) by mouth once a week. 12/27/21   [name], NP  ?mirtazapine (REMERON SOL-TAB) 15 MG disintegrating tablet Take 1 tablet (15 mg total) by mouth at bedtime. 12/23/21 03/23/22  [name], NP  ?simethicone (MYLICON) 125 MG chewable tablet Chew 125 mg by mouth every 6 (six) hours  as needed for flatulence.    [provider]  ?   ? ?Allergies    ?Patient has no known allergies.   ? ?Review of Systems   ?Review of Systems  ?All other systems reviewed and are negative. ? ?Physical Exam ?Updated Vital Signs ?BP (!) 131/107   Pulse 90   Temp 99.1 ?F (37.3 ?C) (Axillary)   Resp (!) 21   Ht 5\' 8"  (1.727 m)   Wt 70.1 kg   SpO2 100%   BMI 23.50 kg/m?  ?Physical Exam ?Vitals and nursing note reviewed.  ?Constitutional:   ?   General: She is not in acute distress. ?   Appearance: Normal appearance. She is well-developed.  ?HENT:  ?   Head: Normocephalic and atraumatic.  ?Eyes:  ?   Conjunctiva/sclera: Conjunctivae normal.  ?   Pupils: Pupils are equal, round, and reactive to light.  ?Cardiovascular:  ?   Rate and Rhythm: Normal rate and regular rhythm.  ?   Heart sounds: Normal heart sounds.  ?Pulmonary:  ?   Effort: Pulmonary effort is normal. No respiratory distress.  ?   Breath sounds: Normal breath sounds.  ?Abdominal:  ?   General: There is no distension.  ?   Palpations: Abdomen is soft.  ?   Tenderness: There is no abdominal tenderness.  ?Musculoskeletal:     ?   General: No deformity. Normal range of motion.  ?   Cervical back: Normal range of motion and  neck supple.  ?Skin: ?   General: Skin is warm and dry.  ?Neurological:  ?   General: No focal deficit present.  ?   Mental Status: She is alert and oriented to person, place, and time.  ? ? ?ED Results / Procedures / Treatments   ?Labs ?(all labs ordered are listed, but only abnormal results are displayed) ?Labs Reviewed  ?COMPREHENSIVE METABOLIC PANEL - Abnormal; Notable for the following components:  ?    Result Value  ? Glucose, Bld 121 (*)   ? BUN 7 (*)   ? All other components within normal limits  ?CBC WITH DIFFERENTIAL/PLATELET  ?LIPASE, BLOOD  ?URINALYSIS, ROUTINE W REFLEX MICROSCOPIC  ? ? ?EKG ?None ? ?Radiology ?No results found. ? ?Procedures ?Procedures  ? ? ?Medications Ordered in ED ?Medications - No data to  display ? ?ED Course/ Medical Decision Making/ A&P ?  ?                        ?Medical Decision Making ?Amount and/or Complexity of Data Reviewed ?Labs: ordered. ?Radiology: ordered. ? ?Risk ?Prescription drug management. ? ? ? ?Medical Screen Complete ? ?This patient presented to the ED with complaint of decreased PO intake. ? ?This complaint involves an extensive number of treatment options. The initial differential diagnosis includes, but is not limited to, dehydration, metabolic abnormality, intra-abdominal pathology, etc. ? ?This presentation is: Acute, Chronic, Self-Limited, Previously Undiagnosed, Uncertain Prognosis, Complicated, Systemic Symptoms, and Threat to Life/Bodily Function ? ?Patient brought to the ED for evaluation of apparent issues with taking p.o. intake for the last 2 weeks. ? ?Patient with dementia.  She is a poor historian.  She is without current complaint upon my evaluation. ? ?Patient's caretaker, her daughter, is not at bedside.  Phone contact attempted but patient has not yet returned phone call. ? ?Baseline labs obtained are without significant abnormality. ? ?Of note, patient's CMP is without evidence of significant dehydration.  Patient's white count is 5.1.  Patient's lipase is normal at 35. ? ?UA and CT abdomen pelvis without contrast are pending. ? ?Patient likely would be appropriate for discharge home if no significant acute pathology is identified. ? ?Dr. Clayborne DanaMesner is aware of pending CT and disposition.  ? ? ? ? ?Co morbidities that complicated the patient's evaluation ? ?Dementia ? ? ?Additional history obtained: ? ?External records from outside sources obtained and reviewed including prior ED visits and prior Inpatient records.  ? ? ?Lab Tests: ? ?I ordered and personally interpreted labs.  The pertinent results include: CBC, CMP, lipase, UA ? ? ?Imaging Studies ordered: ? ?I ordered imaging studies including CT abdomen pelvis ?I agree with the radiologist  interpretation. ? ? ?Cardiac Monitoring: ? ?The patient was maintained on a cardiac monitor.  I personally viewed and interpreted the cardiac monitor which showed an underlying rhythm of: NSR ? ?Problem List / ED Course: ? ?Reported decreased p.o. intake ? ? ?Reevaluation: ? ?After the interventions noted above, I reevaluated the patient and found that they have: stayed the same ? ? ?Disposition: ? ?After consideration of the diagnostic results and the patients response to treatment, I feel that the patent would benefit from completion of ED evaluation. ? ?Pending CT and disposition signed out to Dr. Clayborne DanaMesner.  ? ? ? ? ? ? ? ? ? ? ? ?Final Clinical Impression(s) / ED Diagnoses ?Final diagnoses:  ?Dementia, unspecified dementia severity, unspecified dementia type, unspecified whether behavioral, psychotic, or mood disturbance or  anxiety (HCC)  ? ? ?Rx / DC Orders ?ED Discharge Orders   ? ? None  ? ?  ? ? ?  ?Wynetta Fines, MD ?02/14/22 [phone] ? ?

## 2022-02-11 NOTE — ED Provider Triage Note (Signed)
Emergency Medicine Provider Triage Evaluation Note ? ?Monica Erickson , a 76 y.o. female  was evaluated in triage.  Pt has Alzheimer's and is completed by her daughter who is her caretaker who notes that patient has not been eating for at least 2-1/2 weeks.  They have observed her may be eating 1 pancake or a sip or 2 of a protein shake which she will refuse to do anything else.  They have also been finding food hidden within drawers and cabinets, and believes that her not eating has been going on longer than they realized.  They have spoken with her primary care doctor who advised him to switch to protein shakes or something easier for her to eat without any success.  Over the last couple of days, patient's daughter notes that she is found patient hunched over clutching her stomach and tears from pain.  Because of her Alzheimer's, it is difficult for her to determine what exactly is causing her inappetence, whether it is progression of her disease or if she has something acutely wrong with her abdomen.  ? ?Review of Systems  ?Positive:  ?Negative:  ? ?Physical Exam  ?BP (!) 154/100 (BP Location: Left Arm)   Pulse 92   Temp (!) 97.4 ?F (36.3 ?C) (Axillary)   Resp 16   Ht 5\' 8"  (1.727 m)   Wt 70.1 kg   SpO2 100%   BMI 23.50 kg/m?  ?Gen:   Awake, no distress , cachectic ?Resp:  Normal effort  ?MSK:   Moves extremities without difficulty  ?Other:  Patient winces when pressing on her abdomen diffusely, but when asked that she feels pain she denies, abdomen is otherwise soft, nondistended ? ?Medical Decision Making  ?Medically screening exam initiated at 5:32 PM.  Appropriate orders placed.  Monica Erickson was informed that the remainder of the evaluation will be completed by another provider, this initial triage assessment does not replace that evaluation, and the importance of remaining in the ED until their evaluation is complete. ? ? ?  ?Monica Erickson, Monica Erickson ?02/11/22 1803 ? ?

## 2022-02-11 NOTE — ED Notes (Signed)
This RN and one other RN had unsuccessful IV stick. ?

## 2022-02-11 NOTE — Discharge Instructions (Signed)
Return for any problem.  ?

## 2022-02-11 NOTE — ED Triage Notes (Signed)
Pt arrived via POV from home w/daughter. Pt hasn't eaten or drankx2 wks. Pt's family took her to the dr and the dr ordered protein  drinks, but pt won't drink them. Pt's family has been trying to encourage her to eat, but she hides the food or spits it out and won't eat or drink. Per daughter the pt's urine is dark. Per daughter pt is c/o bilat flank pain. Pt has Alzheimer's at baseline.  ?

## 2022-02-12 ENCOUNTER — Emergency Department (HOSPITAL_COMMUNITY): Payer: Medicare Other

## 2022-02-12 DIAGNOSIS — R109 Unspecified abdominal pain: Secondary | ICD-10-CM | POA: Diagnosis not present

## 2022-02-12 LAB — URINALYSIS, ROUTINE W REFLEX MICROSCOPIC
Bilirubin Urine: NEGATIVE
Glucose, UA: NEGATIVE mg/dL
Ketones, ur: 15 mg/dL — AB
Nitrite: POSITIVE — AB
Protein, ur: NEGATIVE mg/dL
Specific Gravity, Urine: >1.03 — ABNORMAL HIGH (ref 1.005–1.030)
pH: 5.5 (ref 5.0–8.0)

## 2022-02-12 LAB — URINALYSIS, MICROSCOPIC (REFLEX)

## 2022-02-12 MED ORDER — AMOXICILLIN 400 MG/5ML PO SUSR: 2000.0000 mg | Freq: Two times a day (BID) | ORAL | 0 refills | Status: DC

## 2022-02-12 MED ORDER — OMEPRAZOLE 2 MG/ML ORAL SUSPENSION: 20.0000 mg | Freq: Every day | ORAL | 0 refills | Status: DC

## 2022-02-12 MED ORDER — CLARITHROMYCIN 125 MG/5ML PO SUSR: 125.0000 mg | Freq: Two times a day (BID) | ORAL | 0 refills | Status: DC

## 2022-02-12 MED ORDER — CEPHALEXIN 250 MG PO CAPS
1000.0000 mg | ORAL_CAPSULE | Freq: Once | ORAL | Status: DC
  Filled 2022-02-12: qty 4

## 2022-02-12 MED ORDER — ENSURE ACTIVE PO LIQD: 1.0000 | Freq: Every day | ORAL | 2 refills | Status: DC

## 2022-02-12 MED ORDER — AMOXICILLIN 400 MG/5ML PO SUSR
2000.0000 mg | Freq: Two times a day (BID) | ORAL | 0 refills | Status: DC
Start: 2022-02-12 — End: 2022-02-12

## 2022-02-12 MED ORDER — SODIUM CHLORIDE 0.9 % IV SOLN
2.0000 g | Freq: Once | INTRAVENOUS | Status: AC
  Administered 2022-02-12: 2 g via INTRAVENOUS
  Filled 2022-02-12: qty 20

## 2022-02-12 MED ORDER — SODIUM CHLORIDE 0.9 % IV BOLUS
1000.0000 mL | Freq: Once | INTRAVENOUS | Status: AC
  Administered 2022-02-12: 1000 mL via INTRAVENOUS

## 2022-02-12 NOTE — ED Provider Notes (Signed)
4:11 AM ?Assumed care from Dr. Rodena Medin, please see their note for full history, physical and decision making until this point. In brief this is a 76 y.o. year old female who presented to the ED tonight with Failure To Thrive ?    ?Patient with decreased intake possibly related to stomach pain.  Pending CT scan and urinalysis ? ?I reviewed the records appears the patient's been having these problems for at least a couple months and has been addressed by her doctor previously.  We will need to do an in and out cath for sample ? ?CT scan is reassuring.  Her cath does appear to have a urinary tract infection.  Will order Rocephin. ? ?0410: attempted to contact daughter again with results. No answer. LVM.  ? ?Spoke with daughter. States that her decreased PO intake has gotten significantly worse in last couple weeks. 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  Daughter states that she was never able to take medication because they were in liquid form.  I discussed the possible need for endoscopy and daughter states she went to do that but would not want a G-tube or any other larger procedures.  We will consult GI.  Patient seen [facility] in the past. ? ?Spoke with Dr. Marina Goodell who is on-call for [facility].  Did not see any indication for inpatient management but recommended calling office to get set up with an appointment.  I does appear that I can start the patient on liquid medications to cover for H. pylori as she had before and her urinary tract infection.  We will talk to the daughter for discharge. ? ?All this discussed with daughter. Will go ahead and initiate liquid meds for h pylori which should also cover for UTI (esp after rocephin here). Will also ask TOC team to engage for home services to try to help with medication administration and swallowing issues and to see if there is anything they can do to help daughter at home.   ? ?Discharge instructions, including strict return precautions for new or worsening symptoms, given. Patient and/or family verbalized understanding and agreement with the plan as described.  ? ?Labs, studies and imaging reviewed by myself and considered in medical decision making if ordered. Imaging interpreted by radiology. ? ?Labs Reviewed  ?COMPREHENSIVE METABOLIC PANEL - Abnormal; Notable for the following components:  ?    Result Value  ? Glucose, Bld 121 (*)   ? BUN 7 (*)   ? All other components within normal limits  ?URINALYSIS, ROUTINE W REFLEX MICROSCOPIC - Abnormal; Notable for the following components:  ? APPearance HAZY (*)   ? Specific Gravity, Urine >1.030 (*)   ? Hgb urine dipstick TRACE (*)   ? Ketones, ur 15 (*)   ? Nitrite POSITIVE (*)   ? Leukocytes,Ua TRACE (*)   ? All other components within normal limits  ?URINALYSIS, MICROSCOPIC (REFLEX) - Abnormal; Notable for the following components:  ? Bacteria, UA MANY (*)   ? All other components within normal limits  ?CBC WITH DIFFERENTIAL/PLATELET  ?LIPASE, BLOOD  ? ? ?CT ABDOMEN PELVIS WO CONTRAST  ?Final Result  ?  ? ? ?No follow-ups on file. ? ?  ?Monica Memos, MD ?02/12/22 [phone] ? ?

## 2022-02-12 NOTE — ED Notes (Addendum)
Pt refusing to open mouth and according to previous nurse pt was talking. Pt currently only nodding and shaking her head. Pt also refusing to drink water or take meds. Mesner MD made aware ?

## 2022-02-12 NOTE — ED Notes (Signed)
Daughter arrived to transport pt home. Daughter states understanding of d/c paperwork and plan of care at home. Case Manager has been in contact with daughter to arrange at home cares for patient  ?

## 2022-02-13 ENCOUNTER — Ambulatory Visit (INDEPENDENT_AMBULATORY_CARE_PROVIDER_SITE_OTHER): Payer: Medicare Other | Admitting: Nurse Practitioner

## 2022-02-13 ENCOUNTER — Encounter: Payer: Self-pay | Admitting: Nurse Practitioner

## 2022-02-13 VITALS — Temp 97.2°F | Ht 68.0 in | Wt 143.6 lb

## 2022-02-13 DIAGNOSIS — R627 Adult failure to thrive: Secondary | ICD-10-CM | POA: Diagnosis not present

## 2022-02-13 DIAGNOSIS — Z09 Encounter for follow-up examination after completed treatment for conditions other than malignant neoplasm: Secondary | ICD-10-CM | POA: Diagnosis not present

## 2022-02-13 DIAGNOSIS — R630 Anorexia: Secondary | ICD-10-CM | POA: Diagnosis not present

## 2022-02-13 DIAGNOSIS — F0390 Unspecified dementia without behavioral disturbance: Secondary | ICD-10-CM | POA: Diagnosis not present

## 2022-02-13 NOTE — Progress Notes (Signed)
? ?[facility] ?[address] ?[place], [place]  [address] ?Phone:  [phone]   Fax:  [phone] ?Subjective:  ? Patient ID: Monica Erickson, female    DOB: May 22, 1946, 76 y.o.   MRN: [id number] ? ?Chief Complaint  ?Patient presents with  ? Follow-up  ?  Patient is here today to with her daughter to discuss Ct imaging that was done yesterday at the ED. Patient daughter states that she is still not eating nor drinking fluids and was seen in the ED for this yesterday. Patients daughter states that they gave her fluids due to dehydration but she didn't eat anything. Patient daughter also states that she is needing physical therapy for mobility.  ? ?HPI ?Monica Erickson 76 y.o. female  has a past medical history of Chronic headaches, Dementia (HCC), Depression, Difficult intravenous access (2021), Gastric ulcer, H. pylori infection (03/2020), Kidney stones, Night terrors, and Sleep apnea. To the [facility] for reevaluation after ED visit.  ? ?Concerned today about patient not eating for the past 1-2 mths. Family at bedside took patient to the ED and patient was treated for dehydration, UTI and possible H.Pylori infection. Patient has not started taking antibiotics, received first dosage via IV in ED last night.  Family states that since being discharged from the ED, patient is still not eating and seems, "off" this morning. Questioning whether patient changes in mentation related ant medications she was given in the ED. Also requesting additional review of CT completed in the ED.  ? ?Denies any other concerns today.  ? ?Limited HPI and ROS due to AMS and chronic dementia. Information documented provided by family, daughter at bedside is one of patient's primary caregivers. ? ?Past Medical History:  ?Diagnosis Date  ? Chronic headaches   ? Dementia (HCC)   ? ALZHEIMERS FOR A YEAR  ? Depression   ? Difficult intravenous access 2021  ? needs anesthesia , IV team, ultrasound for IV   ? Gastric ulcer   ? H.  pylori infection 03/2020  ? Kidney stones   ? Night terrors   ? Sleep apnea   ? no cpap  ? ? ?Past Surgical History:  ?Procedure Laterality Date  ? BIOPSY  03/22/2020  ? Procedure: BIOPSY;  Surgeon: Rachael Fee, MD;  Location: [facility];  Service: Endoscopy;;  ? Breast Lift    ? CATARACT EXTRACTION Bilateral 01/2020  ? COLONOSCOPY WITH PROPOFOL N/A 03/22/2020  ? Procedure: COLONOSCOPY WITH PROPOFOL;  Surgeon: Rachael Fee, MD;  Location: [facility];  Service: Endoscopy;  Laterality: N/A;  ? CYSTOSCOPY WITH STENT PLACEMENT Right 12/07/2018  ? Procedure: CYSTOSCOPY WITH STENT PLACEMENT;  Surgeon: Crist Fat, MD;  Location: [facility];  Service: Urology;  Laterality: Right;  ? CYSTOSCOPY/URETEROSCOPY/HOLMIUM LASER/STENT PLACEMENT Right 12/31/2018  ? Procedure: CYSTOSCOPY RIGHT URETEROSCOPY/HOLMIUM LASER RIGHT Lucretia Roers;  Surgeon: Crist Fat, MD;  Location: [facility];  Service: Urology;  Laterality: Right;  ? ESOPHAGOGASTRODUODENOSCOPY (EGD) WITH PROPOFOL N/A 03/22/2020  ? Procedure: ESOPHAGOGASTRODUODENOSCOPY (EGD) WITH PROPOFOL;  Surgeon: Rachael Fee, MD;  Location: [facility];  Service: Endoscopy;  Laterality: N/A;  ? REPLACEMENT TOTAL KNEE Bilateral 5 YRS AGO  ? Tummy tuck    ? ? ?Family History  ?Problem Relation Age of Onset  ? Diabetes Mother   ? Hypertension Father   ? Colon cancer Neg Hx   ? Esophageal cancer Neg Hx   ? Stomach cancer Neg Hx   ? Rectal cancer Neg  Hx   ? ? ?Social History  ? ?Socioeconomic History  ? Marital status: Single  ?  Spouse name: Not on file  ? Number of children: 3  ? Years of education: Not on file  ? Highest education level: Not on file  ?Occupational History  ? Occupation: retired  ?Tobacco Use  ? Smoking status: Former  ?  Years: 5.00  ?  Types: Cigarettes  ? Smokeless tobacco: Never  ?Vaping Use  ? Vaping Use: Never used  ?Substance and Sexual Activity  ? Alcohol use: Not Currently  ?  Comment: occasional wine  ? Drug use:  Never  ? Sexual activity: Not Currently  ?Other Topics Concern  ? Not on file  ?Social History Narrative  ? Not on file  ? ?Social Determinants of Health  ? ?Financial Resource Strain: Not on file  ?Food Insecurity: Not on file  ?Transportation Needs: Not on file  ?Physical Activity: Not on file  ?Stress: Not on file  ?Social Connections: Not on file  ?Intimate Partner Violence: Not on file  ? ? ?Outpatient Medications Prior to Visit  ?Medication Sig Dispense Refill  ? amoxicillin (AMOXIL) 400 MG/5ML suspension Take 25 mLs (2,000 mg total) by mouth 2 (two) times daily for 14 days. 700 mL 0  ? calcium carbonate (TUMS - DOSED IN MG ELEMENTAL CALCIUM) 500 MG chewable tablet Chew 1 tablet by mouth daily.    ? clarithromycin (BIAXIN) 125 MG/5ML suspension Take 5 mLs (125 mg total) by mouth 2 (two) times daily for 14 days. 140 mL 0  ? Lactulose 20 GM/30ML SOLN Take 15 mLs (10 g total) by mouth once a week. 237 mL 0  ? mirtazapine (REMERON SOL-TAB) 15 MG disintegrating tablet Take 1 tablet (15 mg total) by mouth at bedtime. 30 tablet 2  ? Nutritional Supplements (ENSURE ACTIVE) LIQD Take 1 Bottle by mouth daily. 414 mL 2  ? omeprazole (FIRST-OMEPRAZOLE) 2 mg/mL SUSP oral suspension Take 10 mLs (20 mg total) by mouth daily for 14 days. 140 mL 0  ? simethicone (MYLICON) 125 MG chewable tablet Chew 125 mg by mouth every 6 (six) hours as needed for flatulence.    ? fluticasone (FLONASE) 50 MCG/ACT nasal spray Place 2 sprays into both nostrils daily. (Patient not taking: Reported on 02/13/2022) 16 g 6  ? ?No facility-administered medications prior to visit.  ? ? ?No Known Allergies ? ?Review of Systems  ?Constitutional:  Negative for fever.  ?Respiratory:  Negative for cough and shortness of breath.   ?Cardiovascular:  Negative for chest pain, palpitations and leg swelling.  ?Gastrointestinal:  Negative for blood in stool, constipation, diarrhea and vomiting.  ?Skin: Negative.   ?Neurological:   ?     See HPI   ?Psychiatric/Behavioral:  Negative for depression. The patient is not nervous/anxious.   ?     See HPI  ?All other systems reviewed and are negative. ? ?   ?Objective:  ?  ?Physical Exam ?Constitutional:   ?   General: She is not in acute distress. ?   Appearance: Normal appearance. She is normal weight.  ?HENT:  ?   Head: Normocephalic.  ?Cardiovascular:  ?   Rate and Rhythm: Normal rate and regular rhythm.  ?   Pulses: Normal pulses.  ?   Heart sounds: Normal heart sounds.  ?   Comments: No obvious peripheral edema ?Pulmonary:  ?   Effort: Pulmonary effort is normal.  ?   Breath sounds: Normal breath sounds.  ?Abdominal:  ?  General: Abdomen is flat. Bowel sounds are normal. There is no distension.  ?   Palpations: Abdomen is soft. There is no mass.  ?   Tenderness: There is no abdominal tenderness. There is no right CVA tenderness, left CVA tenderness, guarding or rebound.  ?   Hernia: No hernia is present.  ?Skin: ?   General: Skin is warm and dry.  ?   Capillary Refill: Capillary refill takes less than 2 seconds.  ?Neurological:  ?   General: No focal deficit present.  ?   Mental Status: She is alert. Mental status is at baseline.  ?   Comments: Alert and oriented to person only  ?Psychiatric:     ?   Mood and Affect: Mood normal.     ?   Behavior: Behavior normal.  ? ? ?Temp (!) 97.2 ?F (36.2 ?C)   Ht 5\' 8"  (1.727 m)   Wt 143 lb 9.6 oz (65.1 kg)   SpO2 100%   BMI 21.83 kg/m?  ?Wt Readings from Last 3 Encounters:  ?02/13/22 143 lb 9.6 oz (65.1 kg)  ?02/11/22 154 lb 8.7 oz (70.1 kg)  ?12/23/21 154 lb 9.6 oz (70.1 kg)  ? ? ? ?There is no immunization history on file for this patient. ? ?Diabetic Foot Exam - Simple   ?No data filed ?  ? ? ?No results found for: TSH ?Lab Results  ?Component Value Date  ? WBC 5.1 02/11/2022  ? HGB 12.8 02/11/2022  ? HCT 40.0 02/11/2022  ? MCV 89.9 02/11/2022  ? PLT 259 02/11/2022  ? ?Lab Results  ?Component Value Date  ? NA 139 02/11/2022  ? K 3.8 02/11/2022  ? CO2 24  02/11/2022  ? GLUCOSE 121 (H) 02/11/2022  ? BUN 7 (L) 02/11/2022  ? CREATININE 0.96 02/11/2022  ? BILITOT 0.9 02/11/2022  ? ALKPHOS 61 02/11/2022  ? AST 19 02/11/2022  ? ALT 8 02/11/2022  ? PROT 7.2 02/11/2022  ? ALBUMIN 3.8 02/11/2022

## 2022-02-13 NOTE — Patient Instructions (Signed)
You were seen today in the [facility] for follow up. Please return  in 3 mths for reevaluation of chronic illness. ?

## 2022-02-14 LAB — URINE CULTURE: Culture: >=100000 — AB

## 2022-02-15 ENCOUNTER — Telehealth: Payer: Self-pay

## 2022-02-15 NOTE — Progress Notes (Signed)
ED Antimicrobial Stewardship Positive Culture Follow Up  ? ?Monica Erickson is an 76 y.o. female who presented to [facility] on 02/11/2022 with a chief complaint of  ?Chief Complaint  ?Patient presents with  ? Failure To Thrive  ? ? ?Recent Results (from the past 720 hour(s))  ?Urine Culture     Status: Abnormal  ? Collection Time: 02/12/22  1:24 AM  ? Specimen: In/Out Cath Urine  ?Result Value Ref Range Status  ? Specimen Description IN/OUT CATH URINE  Final  ? Special Requests   Final  ?  NONE ?Performed at [facility], [address], [place], [place] [address] ?  ? Culture >=100,000 COLONIES/mL ESCHERICHIA COLI (A)  Final  ? Report Status 02/14/2022 FINAL  Final  ? Organism ID, Bacteria ESCHERICHIA COLI (A)  Final  ?    Susceptibility  ? Escherichia coli - MIC*  ?  AMPICILLIN >=32 RESISTANT Resistant   ?  CEFAZOLIN <=4 SENSITIVE Sensitive   ?  CEFEPIME <=0.12 SENSITIVE Sensitive   ?  CEFTRIAXONE <=0.25 SENSITIVE Sensitive   ?  CIPROFLOXACIN <=0.25 SENSITIVE Sensitive   ?  GENTAMICIN <=1 SENSITIVE Sensitive   ?  IMIPENEM <=0.25 SENSITIVE Sensitive   ?  NITROFURANTOIN <=16 SENSITIVE Sensitive   ?  TRIMETH/SULFA <=20 SENSITIVE Sensitive   ?  AMPICILLIN/SULBACTAM 16 INTERMEDIATE Intermediate   ?  PIP/TAZO <=4 SENSITIVE Sensitive   ?  * >=100,000 COLONIES/mL ESCHERICHIA COLI  ? ? ?[x]  Treated with amoxicillin and clarithromycin for UTI and H. Pylori coverage, organism resistant to prescribed antimicrobial ? ?New antibiotic prescription: Nitrofurantoin (Furadantin: 25 mg/5 mL oral suspension) 50mg  q6h for 5 days (Qty 200 mL; Refills 0) ? ?Take this in addition to the antibiotics prescribed for H. Pylori infection above. ? ?ED Provider: ? ? , PharmD, BCPS ?02/15/2022 10:03 AM ?ED Clinical Pharmacist -  [phone] ? ? ?

## 2022-02-15 NOTE — Telephone Encounter (Signed)
Post ED Visit - Positive Culture Follow-up: Unsuccessful Patient Follow-up ? ?Culture assessed and recommendations reviewed by: ? ?[]  , Pharm.D. ?[]  Enzo Bi, Pharm.D., BCPS AQ-ID ?[x]  , Pharm.D., BCPS ?[]  Celedonio Miyamoto, Pharm.D., BCPS ?[]  Brandon, Delmar Landau.D., BCPS, AAHIVP ?[]  , Pharm.D., BCPS, AAHIVP ?[]  Georgina Pillion, PharmD ?[]  , PharmD, BCPS ? ?Positive urine culture ? ?[]  Patient discharged without antimicrobial prescription and treatment is now indicated ?[x]  Organism is resistant to prescribed ED discharge antimicrobial ?[]  Patient with positive blood cultures ? ? ?Unable to contact patient after multipe attempts, letter will be sent to address on file ? ?[place] ?02/15/2022, 3:00 PM  ?

## 2022-02-22 ENCOUNTER — Emergency Department (HOSPITAL_COMMUNITY): Payer: Medicare Other

## 2022-02-22 ENCOUNTER — Inpatient Hospital Stay (HOSPITAL_COMMUNITY)
Admission: EM | Admit: 2022-02-22 | Discharge: 2022-03-06 | DRG: 871 | Disposition: A | Discharge status: Home Health | Payer: Medicare Other | Attending: Internal Medicine | Admitting: Internal Medicine

## 2022-02-22 ENCOUNTER — Other Ambulatory Visit: Payer: Self-pay

## 2022-02-22 ENCOUNTER — Encounter (HOSPITAL_COMMUNITY): Payer: Self-pay

## 2022-02-22 DIAGNOSIS — B9681 Helicobacter pylori [H. pylori] as the cause of diseases classified elsewhere: Secondary | ICD-10-CM | POA: Diagnosis present

## 2022-02-22 DIAGNOSIS — R0602 Shortness of breath: Secondary | ICD-10-CM | POA: Diagnosis not present

## 2022-02-22 DIAGNOSIS — J180 Bronchopneumonia, unspecified organism: Secondary | ICD-10-CM | POA: Diagnosis present

## 2022-02-22 DIAGNOSIS — J189 Pneumonia, unspecified organism: Secondary | ICD-10-CM

## 2022-02-22 DIAGNOSIS — R17 Unspecified jaundice: Secondary | ICD-10-CM | POA: Diagnosis not present

## 2022-02-22 DIAGNOSIS — R32 Unspecified urinary incontinence: Secondary | ICD-10-CM | POA: Diagnosis not present

## 2022-02-22 DIAGNOSIS — R4182 Altered mental status, unspecified: Secondary | ICD-10-CM | POA: Diagnosis not present

## 2022-02-22 DIAGNOSIS — G9341 Metabolic encephalopathy: Secondary | ICD-10-CM | POA: Diagnosis present

## 2022-02-22 DIAGNOSIS — Z6821 Body mass index (BMI) 21.0-21.9, adult: Secondary | ICD-10-CM

## 2022-02-22 DIAGNOSIS — N390 Urinary tract infection, site not specified: Secondary | ICD-10-CM | POA: Diagnosis present

## 2022-02-22 DIAGNOSIS — Z79899 Other long term (current) drug therapy: Secondary | ICD-10-CM

## 2022-02-22 DIAGNOSIS — A419 Sepsis, unspecified organism: Secondary | ICD-10-CM | POA: Diagnosis not present

## 2022-02-22 DIAGNOSIS — D649 Anemia, unspecified: Secondary | ICD-10-CM | POA: Diagnosis present

## 2022-02-22 DIAGNOSIS — F02B2 Dementia in other diseases classified elsewhere, moderate, with psychotic disturbance: Secondary | ICD-10-CM | POA: Diagnosis present

## 2022-02-22 DIAGNOSIS — R609 Edema, unspecified: Secondary | ICD-10-CM | POA: Diagnosis not present

## 2022-02-22 DIAGNOSIS — E876 Hypokalemia: Secondary | ICD-10-CM | POA: Diagnosis not present

## 2022-02-22 DIAGNOSIS — E860 Dehydration: Secondary | ICD-10-CM | POA: Diagnosis not present

## 2022-02-22 DIAGNOSIS — F0390 Unspecified dementia without behavioral disturbance: Secondary | ICD-10-CM | POA: Diagnosis present

## 2022-02-22 DIAGNOSIS — Z96653 Presence of artificial knee joint, bilateral: Secondary | ICD-10-CM | POA: Diagnosis present

## 2022-02-22 DIAGNOSIS — E785 Hyperlipidemia, unspecified: Secondary | ICD-10-CM | POA: Diagnosis not present

## 2022-02-22 DIAGNOSIS — R630 Anorexia: Secondary | ICD-10-CM | POA: Diagnosis not present

## 2022-02-22 DIAGNOSIS — I2699 Other pulmonary embolism without acute cor pulmonale: Secondary | ICD-10-CM | POA: Diagnosis not present

## 2022-02-22 DIAGNOSIS — M7989 Other specified soft tissue disorders: Secondary | ICD-10-CM | POA: Diagnosis not present

## 2022-02-22 DIAGNOSIS — R627 Adult failure to thrive: Secondary | ICD-10-CM | POA: Diagnosis present

## 2022-02-22 DIAGNOSIS — Z87442 Personal history of urinary calculi: Secondary | ICD-10-CM

## 2022-02-22 DIAGNOSIS — F0280 Dementia in other diseases classified elsewhere without behavioral disturbance: Secondary | ICD-10-CM | POA: Diagnosis present

## 2022-02-22 DIAGNOSIS — K2970 Gastritis, unspecified, without bleeding: Secondary | ICD-10-CM | POA: Diagnosis not present

## 2022-02-22 DIAGNOSIS — G4733 Obstructive sleep apnea (adult) (pediatric): Secondary | ICD-10-CM | POA: Diagnosis present

## 2022-02-22 DIAGNOSIS — E872 Acidosis, unspecified: Secondary | ICD-10-CM | POA: Diagnosis not present

## 2022-02-22 DIAGNOSIS — I2609 Other pulmonary embolism with acute cor pulmonale: Secondary | ICD-10-CM | POA: Diagnosis present

## 2022-02-22 DIAGNOSIS — R7989 Other specified abnormal findings of blood chemistry: Secondary | ICD-10-CM | POA: Diagnosis not present

## 2022-02-22 DIAGNOSIS — G319 Degenerative disease of nervous system, unspecified: Secondary | ICD-10-CM | POA: Diagnosis not present

## 2022-02-22 DIAGNOSIS — Z7189 Other specified counseling: Secondary | ICD-10-CM | POA: Diagnosis not present

## 2022-02-22 DIAGNOSIS — F32A Depression, unspecified: Secondary | ICD-10-CM | POA: Diagnosis present

## 2022-02-22 DIAGNOSIS — G9389 Other specified disorders of brain: Secondary | ICD-10-CM | POA: Diagnosis not present

## 2022-02-22 DIAGNOSIS — R109 Unspecified abdominal pain: Secondary | ICD-10-CM | POA: Diagnosis not present

## 2022-02-22 DIAGNOSIS — E871 Hypo-osmolality and hyponatremia: Secondary | ICD-10-CM | POA: Diagnosis not present

## 2022-02-22 DIAGNOSIS — N179 Acute kidney failure, unspecified: Secondary | ICD-10-CM | POA: Diagnosis present

## 2022-02-22 DIAGNOSIS — E8809 Other disorders of plasma-protein metabolism, not elsewhere classified: Secondary | ICD-10-CM | POA: Diagnosis not present

## 2022-02-22 DIAGNOSIS — I2694 Multiple subsegmental pulmonary emboli without acute cor pulmonale: Secondary | ICD-10-CM | POA: Diagnosis not present

## 2022-02-22 DIAGNOSIS — I700 Atherosclerosis of aorta: Secondary | ICD-10-CM | POA: Diagnosis not present

## 2022-02-22 DIAGNOSIS — K429 Umbilical hernia without obstruction or gangrene: Secondary | ICD-10-CM | POA: Diagnosis not present

## 2022-02-22 DIAGNOSIS — R001 Bradycardia, unspecified: Secondary | ICD-10-CM | POA: Diagnosis not present

## 2022-02-22 DIAGNOSIS — R638 Other symptoms and signs concerning food and fluid intake: Secondary | ICD-10-CM | POA: Diagnosis not present

## 2022-02-22 DIAGNOSIS — R410 Disorientation, unspecified: Secondary | ICD-10-CM | POA: Diagnosis not present

## 2022-02-22 DIAGNOSIS — R10816 Epigastric abdominal tenderness: Secondary | ICD-10-CM | POA: Diagnosis not present

## 2022-02-22 DIAGNOSIS — G309 Alzheimer's disease, unspecified: Secondary | ICD-10-CM | POA: Diagnosis present

## 2022-02-22 DIAGNOSIS — R159 Full incontinence of feces: Secondary | ICD-10-CM | POA: Diagnosis not present

## 2022-02-22 DIAGNOSIS — Z87891 Personal history of nicotine dependence: Secondary | ICD-10-CM

## 2022-02-22 DIAGNOSIS — I6782 Cerebral ischemia: Secondary | ICD-10-CM | POA: Diagnosis not present

## 2022-02-22 DIAGNOSIS — Z5329 Procedure and treatment not carried out because of patient's decision for other reasons: Secondary | ICD-10-CM | POA: Diagnosis not present

## 2022-02-22 DIAGNOSIS — A048 Other specified bacterial intestinal infections: Secondary | ICD-10-CM | POA: Diagnosis not present

## 2022-02-22 DIAGNOSIS — R079 Chest pain, unspecified: Secondary | ICD-10-CM | POA: Diagnosis not present

## 2022-02-22 DIAGNOSIS — F02B3 Dementia in other diseases classified elsewhere, moderate, with mood disturbance: Secondary | ICD-10-CM | POA: Diagnosis not present

## 2022-02-22 DIAGNOSIS — Z515 Encounter for palliative care: Secondary | ICD-10-CM | POA: Diagnosis not present

## 2022-02-22 DIAGNOSIS — Z8711 Personal history of peptic ulcer disease: Secondary | ICD-10-CM

## 2022-02-22 LAB — CBC WITH DIFFERENTIAL/PLATELET
Abs Immature Granulocytes: 0.06 10*3/uL (ref 0.00–0.07)
Basophils Absolute: 0 10*3/uL (ref 0.0–0.1)
Basophils Relative: 0 %
Eosinophils Absolute: 0 10*3/uL (ref 0.0–0.5)
Eosinophils Relative: 0 %
HCT: 41.8 % (ref 36.0–46.0)
Hemoglobin: 13.4 g/dL (ref 12.0–15.0)
Immature Granulocytes: 1 %
Lymphocytes Relative: 11 %
Lymphs Abs: 1.1 10*3/uL (ref 0.7–4.0)
MCH: 29.5 pg (ref 26.0–34.0)
MCHC: 32.1 g/dL (ref 30.0–36.0)
MCV: 91.9 fL (ref 80.0–100.0)
Monocytes Absolute: 0.8 10*3/uL (ref 0.1–1.0)
Monocytes Relative: 8 %
Neutro Abs: 8.1 10*3/uL — ABNORMAL HIGH (ref 1.7–7.7)
Neutrophils Relative %: 80 %
Platelets: 228 10*3/uL (ref 150–400)
RBC: 4.55 MIL/uL (ref 3.87–5.11)
RDW: 15.7 % — ABNORMAL HIGH (ref 11.5–15.5)
WBC: 10.1 10*3/uL (ref 4.0–10.5)
nRBC: 0 % (ref 0.0–0.2)

## 2022-02-22 MED ORDER — SODIUM CHLORIDE 0.9 % IV SOLN
2.0000 g | INTRAVENOUS | Status: AC
  Administered 2022-02-23 – 2022-02-26 (×5): 2 g via INTRAVENOUS
  Filled 2022-02-22 (×5): qty 20

## 2022-02-22 MED ORDER — LACTATED RINGERS IV SOLN: INTRAVENOUS | Status: DC

## 2022-02-22 MED ORDER — LACTATED RINGERS IV BOLUS (SEPSIS)
1000.0000 mL | Freq: Once | INTRAVENOUS | Status: AC
  Administered 2022-02-23: 1000 mL via INTRAVENOUS

## 2022-02-22 MED ORDER — SODIUM CHLORIDE 0.9 % IV SOLN
500.0000 mg | INTRAVENOUS | Status: AC
  Administered 2022-02-23 – 2022-02-27 (×5): 500 mg via INTRAVENOUS
  Filled 2022-02-22 (×5): qty 5

## 2022-02-22 NOTE — ED Provider Notes (Signed)
?[facility] ?Provider Note ? ? ?CSN: [id number] ?Arrival date & time: 02/22/22  2116 ? ?  ? ?History ? ?Chief Complaint  ?Patient presents with  ? Flank Pain  ? ? ?Monica Erickson is a 76 y.o. female. ? ?Monica SaltLoretta Erickson is a 76 y.o. female with a history of dementia, recently treated for H. pylori infection and UTI, chronic headaches, kidney stones, who presents to the emergency department from home for evaluation of right flank pain, increased confusion and weakness.  Patient with dementia, and has difficulty answering questions, typically responds with I do not remember or a nonsensical answer.  I called and spoke with patient's daughter who is her primary caretaker.  She reports that she was diagnosed with a UTI and was put on antibiotics to treat for this as well as H. pylori and has been taking these, but over the past 24 hours she has become more confused and less responsive than usual.  She reports she has been sleeping throughout the day which is atypical for her and did not want to get out of bed.  When I tried to get her up out of bed she seemed to be very uncomfortable and complaining of pain over her right flank.  No fevers known by family.  Patient has not verbalized any other complaints to family. ? ?The history is provided by the patient and a relative.  ?Flank Pain ? ? ?  ? ?Home Medications ?Prior to Admission medications   ?Medication Sig Start Date End Date Taking? Authorizing Provider  ?amoxicillin (AMOXIL) 400 MG/5ML suspension Take 25 mLs (2,000 mg total) by mouth 2 (two) times daily for 14 days. 02/12/22 02/26/22  Terald Sleeperrifan, Matthew J, MD  ?calcium carbonate (TUMS - DOSED IN MG ELEMENTAL CALCIUM) 500 MG chewable tablet Chew 1 tablet by mouth daily.    [provider]  ?clarithromycin (BIAXIN) 125 MG/5ML suspension Take 5 mLs (125 mg total) by mouth 2 (two) times daily for 14 days. 02/12/22 02/26/22  Terald Sleeperrifan, Matthew J, MD  ?fluticasone (FLONASE) 50 MCG/ACT nasal  spray Place 2 sprays into both nostrils daily. ?Patient not taking: Reported on 02/13/2022 12/23/21   Barbette MerinoKing, Crystal M, NP  ?Lactulose 20 GM/30ML SOLN Take 15 mLs (10 g total) by mouth once a week. 12/27/21   Barbette MerinoKing, Crystal M, NP  ?mirtazapine (REMERON SOL-TAB) 15 MG disintegrating tablet Take 1 tablet (15 mg total) by mouth at bedtime. 12/23/21 03/23/22  Barbette MerinoKing, Crystal M, NP  ?Nutritional Supplements (ENSURE ACTIVE) LIQD Take 1 Bottle by mouth daily. 02/12/22 03/14/22  Terald Sleeperrifan, Matthew J, MD  ?omeprazole (FIRST-OMEPRAZOLE) 2 mg/mL SUSP oral suspension Take 10 mLs (20 mg total) by mouth daily for 14 days. 02/12/22 02/26/22  Terald Sleeperrifan, Matthew J, MD  ?simethicone (MYLICON) 125 MG chewable tablet Chew 125 mg by mouth every 6 (six) hours as needed for flatulence.    [provider]  ?   ? ?Allergies    ?Patient has no known allergies.   ? ?Review of Systems   ?Review of Systems  ?Unable to perform ROS: Dementia  ? ?Physical Exam ?Updated Vital Signs ?BP 95/60 (BP Location: Right Arm)   Pulse (!) 107   Temp 98.9 ?F (37.2 ?C) (Oral)   Resp 16   SpO2 98%  ?Physical Exam ?Vitals and nursing note reviewed.  ?Constitutional:   ?   General: She is not in acute distress. ?   Appearance: Normal appearance. She is well-developed. She is not diaphoretic.  ?  Comments: Alert and demented, no acute distress, unable to answer most questions, reporting that she does not know or providing nonsensical answers.  ?HENT:  ?   Head: Normocephalic and atraumatic.  ?   Comments: No evidence of head trauma ?   Mouth/Throat:  ?   Mouth: Mucous membranes are dry.  ?   Pharynx: Oropharynx is clear.  ?   Comments: Mucous membranes slightly dry, oropharynx clear ?Eyes:  ?   General:     ?   Right eye: No discharge.     ?   Left eye: No discharge.  ?   Extraocular Movements: Extraocular movements intact.  ?   Pupils: Pupils are equal, round, and reactive to light.  ?Cardiovascular:  ?   Rate and Rhythm: Normal rate and regular rhythm.  ?   Pulses: Normal  pulses.  ?   Heart sounds: Normal heart sounds.  ?Pulmonary:  ?   Effort: Pulmonary effort is normal. No respiratory distress.  ?   Breath sounds: Normal breath sounds. No wheezing or rales.  ?   Comments: Respirations equal and unlabored, patient able to speak in full sentences, lungs clear to auscultation bilaterally  ?Abdominal:  ?   General: Bowel sounds are normal. There is no distension.  ?   Palpations: Abdomen is soft. There is no mass.  ?   Tenderness: There is abdominal tenderness. There is right CVA tenderness. There is no guarding.  ?   Comments: Abdomen soft, nondistended, nontender to palpation in all quadrants without guarding or peritoneal signs, there is some right flank tenderness  ?Musculoskeletal:     ?   General: No deformity.  ?   Cervical back: Neck supple.  ?Skin: ?   General: Skin is warm and dry.  ?   Capillary Refill: Capillary refill takes less than 2 seconds.  ?Neurological:  ?   Mental Status: She is alert and oriented to person, place, and time.  ?   Coordination: Coordination normal.  ?   Comments: Unable to answer most questions, able to follow some commands but requires repeated instructions. ?CN III-XII intact ?Normal strength in upper and lower extremities bilaterally including dorsiflexion and plantar flexion, strong and equal grip strength ?Sensation normal to light and sharp touch ?Moves extremities without ataxia, coordination intact  ?Psychiatric:     ?   Mood and Affect: Mood normal.     ?   Behavior: Behavior normal.  ? ? ?ED Results / Procedures / Treatments   ?Labs ?(all labs ordered are listed, but only abnormal results are displayed) ?Labs Reviewed  ?COMPREHENSIVE METABOLIC PANEL - Abnormal; Notable for the following components:  ?    Result Value  ? Glucose, Bld 151 (*)   ? Creatinine, Ser 1.06 (*)   ? Total Bilirubin 1.6 (*)   ? GFR, Estimated 55 (*)   ? All other components within normal limits  ?LACTIC ACID, PLASMA - Abnormal; Notable for the following components:   ? Lactic Acid, Venous 2.5 (*)   ? All other components within normal limits  ?CBC WITH DIFFERENTIAL/PLATELET - Abnormal; Notable for the following components:  ? RDW 15.7 (*)   ? Neutro Abs 8.1 (*)   ? All other components within normal limits  ?URINALYSIS, ROUTINE W REFLEX MICROSCOPIC - Abnormal; Notable for the following components:  ? Color, Urine AMBER (*)   ? Specific Gravity, Urine >1.030 (*)   ? Bilirubin Urine SMALL (*)   ? Protein, ur 30 (*)   ?  All other components within normal limits  ?PROTIME-INR - Abnormal; Notable for the following components:  ? Prothrombin Time 16.4 (*)   ? INR 1.3 (*)   ? All other components within normal limits  ?CBC - Abnormal; Notable for the following components:  ? RBC 3.35 (*)   ? Hemoglobin 10.2 (*)   ? HCT 30.2 (*)   ? RDW 15.7 (*)   ? All other components within normal limits  ?URINALYSIS, MICROSCOPIC (REFLEX) - Abnormal; Notable for the following components:  ? Bacteria, UA FEW (*)   ? All other components within normal limits  ?COMPREHENSIVE METABOLIC PANEL - Abnormal; Notable for the following components:  ? Potassium 3.2 (*)   ? Calcium 8.1 (*)   ? Total Protein 4.9 (*)   ? Albumin 2.2 (*)   ? AST 14 (*)   ? All other components within normal limits  ?CULTURE, BLOOD (ROUTINE X 2)  ?CULTURE, BLOOD (ROUTINE X 2)  ?LACTIC ACID, PLASMA  ?TSH  ?VITAMIN B12  ?AMMONIA  ?D-DIMER, QUANTITATIVE (NOT AT [facility])  ?HEPARIN LEVEL (UNFRACTIONATED)  ? ? ?EKG ?None ? ?Radiology ?DG Chest 2 View ? ?Result Date: 02/22/2022 ?CLINICAL DATA:  Sepsis, lower back and right flank pain, urinary tract infection EXAM: CHEST - 2 VIEW COMPARISON:  12/06/2019 FINDINGS: Frontal and lateral views of the chest demonstrate an unremarkable cardiac silhouette. There is patchy airspace disease within the periphery of the right mid and lower lung zones. No effusion or pneumothorax. No acute bony abnormality. IMPRESSION: 1. Patchy airspace disease within the right mid and lower lung zones consistent with  bronchopneumonia. Electronically Signed   By: Sharlet Salina M.D.   On: 02/22/2022 22:17  ?  ? ?CT ABDOMEN PELVIS W CONTRAST ? ?Addendum Date: 02/23/2022   ?ADDENDUM REPORT: 02/23/2022 02:20 ADDENDUM: Critical f

## 2022-02-22 NOTE — ED Triage Notes (Signed)
Pt arrived POV from home c/o lower back and right flank pain. Pt was diagnosed with a UTI on 4/4. Pt was given antibiotics but has not finished the course. Per family pt is c/o of pain and been lethargic. Pt does have dementia. Per family pt is normally more alert.  ?

## 2022-02-23 ENCOUNTER — Inpatient Hospital Stay (HOSPITAL_COMMUNITY): Payer: Medicare Other

## 2022-02-23 ENCOUNTER — Emergency Department (HOSPITAL_COMMUNITY): Payer: Medicare Other

## 2022-02-23 DIAGNOSIS — E860 Dehydration: Secondary | ICD-10-CM | POA: Diagnosis present

## 2022-02-23 DIAGNOSIS — R630 Anorexia: Secondary | ICD-10-CM | POA: Diagnosis not present

## 2022-02-23 DIAGNOSIS — I2699 Other pulmonary embolism without acute cor pulmonale: Secondary | ICD-10-CM | POA: Diagnosis not present

## 2022-02-23 DIAGNOSIS — E785 Hyperlipidemia, unspecified: Secondary | ICD-10-CM | POA: Diagnosis present

## 2022-02-23 DIAGNOSIS — R32 Unspecified urinary incontinence: Secondary | ICD-10-CM | POA: Diagnosis not present

## 2022-02-23 DIAGNOSIS — I2609 Other pulmonary embolism with acute cor pulmonale: Secondary | ICD-10-CM | POA: Diagnosis present

## 2022-02-23 DIAGNOSIS — R001 Bradycardia, unspecified: Secondary | ICD-10-CM | POA: Diagnosis not present

## 2022-02-23 DIAGNOSIS — R159 Full incontinence of feces: Secondary | ICD-10-CM | POA: Diagnosis not present

## 2022-02-23 DIAGNOSIS — E8809 Other disorders of plasma-protein metabolism, not elsewhere classified: Secondary | ICD-10-CM | POA: Diagnosis present

## 2022-02-23 DIAGNOSIS — F02B2 Dementia in other diseases classified elsewhere, moderate, with psychotic disturbance: Secondary | ICD-10-CM | POA: Diagnosis present

## 2022-02-23 DIAGNOSIS — N390 Urinary tract infection, site not specified: Secondary | ICD-10-CM | POA: Diagnosis present

## 2022-02-23 DIAGNOSIS — F0390 Unspecified dementia without behavioral disturbance: Secondary | ICD-10-CM | POA: Diagnosis not present

## 2022-02-23 DIAGNOSIS — E872 Acidosis, unspecified: Secondary | ICD-10-CM | POA: Diagnosis present

## 2022-02-23 DIAGNOSIS — D649 Anemia, unspecified: Secondary | ICD-10-CM | POA: Diagnosis present

## 2022-02-23 DIAGNOSIS — M7989 Other specified soft tissue disorders: Secondary | ICD-10-CM

## 2022-02-23 DIAGNOSIS — Z7189 Other specified counseling: Secondary | ICD-10-CM | POA: Diagnosis not present

## 2022-02-23 DIAGNOSIS — J189 Pneumonia, unspecified organism: Secondary | ICD-10-CM | POA: Diagnosis not present

## 2022-02-23 DIAGNOSIS — K2970 Gastritis, unspecified, without bleeding: Secondary | ICD-10-CM | POA: Diagnosis present

## 2022-02-23 DIAGNOSIS — G9341 Metabolic encephalopathy: Secondary | ICD-10-CM | POA: Diagnosis present

## 2022-02-23 DIAGNOSIS — R7989 Other specified abnormal findings of blood chemistry: Secondary | ICD-10-CM | POA: Diagnosis not present

## 2022-02-23 DIAGNOSIS — G309 Alzheimer's disease, unspecified: Secondary | ICD-10-CM | POA: Diagnosis not present

## 2022-02-23 DIAGNOSIS — F32A Depression, unspecified: Secondary | ICD-10-CM | POA: Diagnosis present

## 2022-02-23 DIAGNOSIS — R4182 Altered mental status, unspecified: Secondary | ICD-10-CM | POA: Diagnosis present

## 2022-02-23 DIAGNOSIS — A419 Sepsis, unspecified organism: Secondary | ICD-10-CM | POA: Diagnosis present

## 2022-02-23 DIAGNOSIS — E876 Hypokalemia: Secondary | ICD-10-CM | POA: Diagnosis not present

## 2022-02-23 DIAGNOSIS — N179 Acute kidney failure, unspecified: Secondary | ICD-10-CM | POA: Diagnosis present

## 2022-02-23 DIAGNOSIS — R17 Unspecified jaundice: Secondary | ICD-10-CM | POA: Diagnosis not present

## 2022-02-23 DIAGNOSIS — R609 Edema, unspecified: Secondary | ICD-10-CM | POA: Diagnosis not present

## 2022-02-23 DIAGNOSIS — A048 Other specified bacterial intestinal infections: Secondary | ICD-10-CM | POA: Diagnosis not present

## 2022-02-23 DIAGNOSIS — E871 Hypo-osmolality and hyponatremia: Secondary | ICD-10-CM | POA: Diagnosis not present

## 2022-02-23 DIAGNOSIS — J180 Bronchopneumonia, unspecified organism: Secondary | ICD-10-CM | POA: Diagnosis present

## 2022-02-23 DIAGNOSIS — Z515 Encounter for palliative care: Secondary | ICD-10-CM | POA: Diagnosis not present

## 2022-02-23 DIAGNOSIS — R627 Adult failure to thrive: Secondary | ICD-10-CM | POA: Diagnosis not present

## 2022-02-23 LAB — LACTIC ACID, PLASMA
Lactic Acid, Venous: 2.5 mmol/L (ref 0.5–1.9)
Lactic Acid, Venous: 1.8 mmol/L (ref 0.5–1.9)

## 2022-02-23 LAB — COMPREHENSIVE METABOLIC PANEL
ALT: 14 U/L (ref 0–44)
ALT: 8 U/L (ref 0–44)
AST: 25 U/L (ref 15–41)
AST: 14 U/L — ABNORMAL LOW (ref 15–41)
Albumin: 3.5 g/dL (ref 3.5–5.0)
Albumin: 2.2 g/dL — ABNORMAL LOW (ref 3.5–5.0)
Alkaline Phosphatase: 79 U/L (ref 38–126)
Alkaline Phosphatase: 56 U/L (ref 38–126)
Anion gap: 12 (ref 5–15)
Anion gap: 9 (ref 5–15)
BUN: 15 mg/dL (ref 8–23)
BUN: 11 mg/dL (ref 8–23)
CO2: 26 mmol/L (ref 22–32)
CO2: 25 mmol/L (ref 22–32)
Calcium: 9.4 mg/dL (ref 8.9–10.3)
Calcium: 8.1 mg/dL — ABNORMAL LOW (ref 8.9–10.3)
Chloride: 102 mmol/L (ref 98–111)
Chloride: 104 mmol/L (ref 98–111)
Creatinine, Ser: 1.06 mg/dL — ABNORMAL HIGH (ref 0.44–1.00)
Creatinine, Ser: 0.73 mg/dL (ref 0.44–1.00)
GFR, Estimated: 55 mL/min — ABNORMAL LOW (ref >60)
GFR, Estimated: >60 mL/min (ref >60)
Glucose, Bld: 151 mg/dL — ABNORMAL HIGH (ref 70–99)
Glucose, Bld: 96 mg/dL (ref 70–99)
Potassium: 4.1 mmol/L (ref 3.5–5.1)
Potassium: 3.2 mmol/L — ABNORMAL LOW (ref 3.5–5.1)
Sodium: 140 mmol/L (ref 135–145)
Sodium: 138 mmol/L (ref 135–145)
Total Bilirubin: 1.6 mg/dL — ABNORMAL HIGH (ref 0.3–1.2)
Total Bilirubin: 0.8 mg/dL (ref 0.3–1.2)
Total Protein: 7.7 g/dL (ref 6.5–8.1)
Total Protein: 4.9 g/dL — ABNORMAL LOW (ref 6.5–8.1)

## 2022-02-23 LAB — CBC
HCT: 30.2 % — ABNORMAL LOW (ref 36.0–46.0)
Hemoglobin: 10.2 g/dL — ABNORMAL LOW (ref 12.0–15.0)
MCH: 30.4 pg (ref 26.0–34.0)
MCHC: 33.8 g/dL (ref 30.0–36.0)
MCV: 90.1 fL (ref 80.0–100.0)
Platelets: 158 10*3/uL (ref 150–400)
RBC: 3.35 MIL/uL — ABNORMAL LOW (ref 3.87–5.11)
RDW: 15.7 % — ABNORMAL HIGH (ref 11.5–15.5)
WBC: 8.3 10*3/uL (ref 4.0–10.5)
nRBC: 0 % (ref 0.0–0.2)

## 2022-02-23 LAB — TSH: TSH: 1.01 u[IU]/mL (ref 0.350–4.500)

## 2022-02-23 LAB — PROTIME-INR
INR: 1.3 — ABNORMAL HIGH (ref 0.8–1.2)
Prothrombin Time: 16.4 seconds — ABNORMAL HIGH (ref 11.4–15.2)

## 2022-02-23 LAB — URINALYSIS, ROUTINE W REFLEX MICROSCOPIC
Glucose, UA: NEGATIVE mg/dL
Hgb urine dipstick: NEGATIVE
Ketones, ur: NEGATIVE mg/dL
Leukocytes,Ua: NEGATIVE
Nitrite: NEGATIVE
Protein, ur: 30 mg/dL — AB
Specific Gravity, Urine: >1.03 — ABNORMAL HIGH (ref 1.005–1.030)
pH: 6 (ref 5.0–8.0)

## 2022-02-23 LAB — URINALYSIS, MICROSCOPIC (REFLEX)

## 2022-02-23 LAB — AMMONIA: Ammonia: 12 umol/L (ref 9–35)

## 2022-02-23 LAB — VITAMIN B12: Vitamin B-12: 210 pg/mL (ref 180–914)

## 2022-02-23 LAB — D-DIMER, QUANTITATIVE: D-Dimer, Quant: 3.28 ug/mL-FEU — ABNORMAL HIGH (ref 0.00–0.50)

## 2022-02-23 MED ORDER — ACETAMINOPHEN 650 MG RE SUPP: 650.0000 mg | Freq: Four times a day (QID) | RECTAL | Status: DC | PRN

## 2022-02-23 MED ORDER — IOHEXOL 300 MG/ML  SOLN
100.0000 mL | Freq: Once | INTRAMUSCULAR | Status: AC | PRN
  Administered 2022-02-23: 100 mL via INTRAVENOUS

## 2022-02-23 MED ORDER — FOLIC ACID 1 MG PO TABS
1.0000 mg | ORAL_TABLET | Freq: Every day | ORAL | Status: DC
  Filled 2022-02-23 (×7): qty 1

## 2022-02-23 MED ORDER — KCL IN DEXTROSE-NACL 20-5-0.45 MEQ/L-%-% IV SOLN
INTRAVENOUS | Status: DC
  Filled 2022-02-23 (×2): qty 1000

## 2022-02-23 MED ORDER — HEPARIN (PORCINE) 25000 UT/250ML-% IV SOLN
1000.0000 [IU]/h | INTRAVENOUS | Status: DC
  Administered 2022-02-23: 1000 [IU]/h via INTRAVENOUS

## 2022-02-23 MED ORDER — HEPARIN (PORCINE) 25000 UT/250ML-% IV SOLN
1000.0000 [IU]/h | INTRAVENOUS | Status: DC
  Administered 2022-02-23: 1000 [IU]/h via INTRAVENOUS
  Filled 2022-02-23: qty 250

## 2022-02-23 MED ORDER — ACETAMINOPHEN 325 MG PO TABS
650.0000 mg | ORAL_TABLET | Freq: Four times a day (QID) | ORAL | Status: DC | PRN
  Filled 2022-02-23: qty 2

## 2022-02-23 MED ORDER — CYANOCOBALAMIN 1000 MCG/ML IJ SOLN
1000.0000 ug | Freq: Once | INTRAMUSCULAR | Status: AC
  Administered 2022-02-23: 1000 ug via INTRAMUSCULAR
  Filled 2022-02-23: qty 1

## 2022-02-23 MED ORDER — HEPARIN BOLUS VIA INFUSION
3000.0000 [IU] | Freq: Once | INTRAVENOUS | Status: AC
  Administered 2022-02-23: 3000 [IU] via INTRAVENOUS
  Filled 2022-02-23: qty 3000

## 2022-02-23 MED ORDER — FENTANYL CITRATE PF 50 MCG/ML IJ SOSY
25.0000 ug | PREFILLED_SYRINGE | Freq: Once | INTRAMUSCULAR | Status: AC
  Administered 2022-02-23: 25 ug via INTRAVENOUS
  Filled 2022-02-23: qty 1

## 2022-02-23 MED ORDER — ADULT MULTIVITAMIN W/MINERALS CH
1.0000 | ORAL_TABLET | Freq: Every day | ORAL | Status: DC
  Administered 2022-03-05: 1 via ORAL
  Filled 2022-02-23 (×7): qty 1

## 2022-02-23 MED ORDER — VITAMIN B-12 1000 MCG PO TABS
1000.0000 ug | ORAL_TABLET | Freq: Every day | ORAL | Status: DC
  Administered 2022-03-05: 1000 ug via ORAL
  Filled 2022-02-23 (×7): qty 1

## 2022-02-23 MED ORDER — ENOXAPARIN SODIUM 80 MG/0.8ML IJ SOSY
1.0000 mg/kg | PREFILLED_SYRINGE | Freq: Two times a day (BID) | INTRAMUSCULAR | Status: AC
  Administered 2022-02-23 – 2022-02-25 (×5): 65 mg via SUBCUTANEOUS
  Filled 2022-02-23: qty 0.8
  Filled 2022-02-23: qty 0.65
  Filled 2022-02-23: qty 0.8
  Filled 2022-02-23: qty 0.65
  Filled 2022-02-23 (×2): qty 0.8

## 2022-02-23 NOTE — ED Notes (Signed)
Patient transported to CT 

## 2022-02-23 NOTE — Progress Notes (Signed)
PROGRESS NOTE ? ?Brief Narrative: ?Monica Erickson is a 76 y.o. female with a history of dementia, H. pylori infection, OSA not on CPAP, and recent ED evaluation for UTI and dehydration who presented to the ED 4/15 with confusion, somnolence, and flank pain. She was afebrile with tachycardia, soft BP, WBC 10.1k, Cr 1. Lactic acid 2.5 > 1.8 with LR boluses. CXR showed right mid and lower opacities concerning for bronchopneumonia. CT abd/pelvis (due to flank pain, hx nephrolithiasis) was performed with contrast did not reveal urinary abnormality, but confirmed right base airspace opacity with small pleural effusion, and hypodensity in right pulmonary artery concerning for PE. Heparin was started after head CT confirmed no hemorrhage, and antibiotics have been continued since admission this morning by Dr. Loney Loh.  ? ?Subjective: ?Pt very confused. She says "no ma'am" when asked about pain and shortness of breath. ? ?Objective: ?BP 125/66   Pulse 80   Temp 99.5 ?F (37.5 ?C) (Axillary)   Resp 18   Ht 5\' 8"  (1.727 m)   Wt 65.1 kg   SpO2 98%   BMI 21.82 kg/m?   ?Gen: Elderly, nontoxic female laying completely flat in no distress ?Pulm: Diminished at right base without definite crackles, no wheezes.  ?CV: RRR, no murmur, no JVD, no edema ?GI: Soft, NT, ND, +BS  ?Neuro: Alert and disoriented. Follows commands without focal deficits. ?Skin: No infections noted. LE's without erythema or induration or pitting. Exam limited by pt's having pain on palpation L > R.  ? ?Assessment & Plan: ?Sepsis due to Right sided bronchopneumonia with parapneumonic effusion: Lactic acid cleared with fluids.  ?- Continue CTX, azithromycin.  ?- Add-on urine antigens to strep and legionella ?- Blood cultures sent and pending. WBC and lactic acid are reassuring. Add sputum culture if able to expectorate. ?- SLP evaluation prior to intiating diet ?- Pt currently on supplemental oxygen but normal respiratory effort on exam and SpO2 100%. Will  request weaning to room air.  ? ?Positive d-dimer, concern for PE on CT abd/pelvis: Also w/tenderness to L > R LE's on palpation.  ?- Check venous U/S ?- Continue empiric heparin. Will check CTA chest 4/17 AM once renal function shown to be stable (avoiding multiple dye loads in short succession) ? ?Acute metabolic encephalopathy on chronic dementia: Unclear where she's at regarding her baseline at this time. TSH 1.01, B12 210, ammonia 12. Head CT non acute. Urinalysis shows few bacteria and no WBCs not consistent with infection. ?- Delirium precautions ? ?Hypokalemia:  ?- Supplement, stay on IVF until taking po.  ? ?AKI: Cr 1.06, down to 0.76 with IV boluses and fluids.  ?- Continue IVF. Will decrease rate. ? ?Poor oral intake, hypoalbuminemia: Not a new issue. Family has declined consideration for G tube in the past.  ?- With suspicion for this to be ongoing and lower B12 level, will supplement at this time ? ?Hyperbilirubinemia: Resolved. ? ?5/17, MD ?Pager on amion ?02/23/2022, 10:28 AM   ?

## 2022-02-23 NOTE — H&P (Signed)
?History and Physical  ? ? ?[name] GUR:[id number] DOB: July 17, 1946 DOA: 02/22/2022 ? ?PCP: [name], NP ? ?Patient coming from: Home ? ?Chief Complaint: Altered mental status ? ?HPI: Monica Erickson is a 75 y.o. female with medical history significant of dementia, chronic headaches, depression, gastric ulcer, H. pylori infection, nephrolithiasis, sleep apnea not on CPAP.  Recently seen in the ED on 4/5 for dehydration, UTI, and possible H. pylori infection.  GI recommended outpatient follow-up. ? ?She presents to the ED for evaluation of confusion, somnolence, and flank pain.  Slightly tachycardic on arrival with low blood pressure in the 90s.  Not febrile.  WBC 10.1.  BUN 15, creatinine 1.0.  T. bili 1.6, remainder of LFTs normal.  Lactic acid 2.5.  INR 1.3.  Blood cultures drawn.  UA pending.  Chest x-Giuliano showing patchy airspace disease within the right mid and lower lung zones consistent with bronchopneumonia.  CT abdomen pelvis with contrast negative for renal calculus or obstructive uropathy.  Showing trace right pleural effusion with patchy airspace disease at the right lung base concerning for pneumonia.  Also showing prominent right hilum with a hypodense region in the right pulmonary artery only partially visualized on exam, possible PE versus prominent lymph nodes.  Dedicated CT angiogram chest recommended for further evaluation.  Patient was given fentanyl, IV heparin, ceftriaxone, azithromycin, and 2 L LR boluses.  Repeat lactate 1.8. ? ?Patient is oriented to self only and not able to give any history. ? ?Review of Systems:  ?Review of Systems  ?Reason unable to perform ROS: Altered mental status.  ? ?Past Medical History:  ?Diagnosis Date  ? Chronic headaches   ? Dementia (HCC)   ? ALZHEIMERS FOR A YEAR  ? Depression   ? Difficult intravenous access 2021  ? needs anesthesia , IV team, ultrasound for IV   ? Gastric ulcer   ? H. pylori infection 03/2020  ? Kidney stones   ? Night terrors   ? Sleep  apnea   ? no cpap  ? ? ?Past Surgical History:  ?Procedure Laterality Date  ? BIOPSY  03/22/2020  ? Procedure: BIOPSY;  Surgeon: Rachael Fee, MD;  Location: [facility];  Service: Endoscopy;;  ? Breast Lift    ? CATARACT EXTRACTION Bilateral 01/2020  ? COLONOSCOPY WITH PROPOFOL N/A 03/22/2020  ? Procedure: COLONOSCOPY WITH PROPOFOL;  Surgeon: Rachael Fee, MD;  Location: [facility];  Service: Endoscopy;  Laterality: N/A;  ? CYSTOSCOPY WITH STENT PLACEMENT Right 12/07/2018  ? Procedure: CYSTOSCOPY WITH STENT PLACEMENT;  Surgeon: Crist Fat, MD;  Location: [facility];  Service: Urology;  Laterality: Right;  ? CYSTOSCOPY/URETEROSCOPY/HOLMIUM LASER/STENT PLACEMENT Right 12/31/2018  ? Procedure: CYSTOSCOPY RIGHT URETEROSCOPY/HOLMIUM LASER RIGHT Lucretia Roers;  Surgeon: Crist Fat, MD;  Location: [facility];  Service: Urology;  Laterality: Right;  ? ESOPHAGOGASTRODUODENOSCOPY (EGD) WITH PROPOFOL N/A 03/22/2020  ? Procedure: ESOPHAGOGASTRODUODENOSCOPY (EGD) WITH PROPOFOL;  Surgeon: Rachael Fee, MD;  Location: [facility];  Service: Endoscopy;  Laterality: N/A;  ? REPLACEMENT TOTAL KNEE Bilateral 5 YRS AGO  ? Tummy tuck    ? ? ? reports that she has quit smoking. Her smoking use included cigarettes. She has never used smokeless tobacco. She reports that she does not currently use alcohol. She reports that she does not use drugs. ? ?No Known Allergies ? ?Family History  ?Problem Relation Age of Onset  ? Diabetes Mother   ? Hypertension Father   ? Colon cancer Neg Hx   ?  Esophageal cancer Neg Hx   ? Stomach cancer Neg Hx   ? Rectal cancer Neg Hx   ? ? ?Prior to Admission medications   ?Medication Sig Start Date End Date Taking? Authorizing Provider  ?amoxicillin (AMOXIL) 400 MG/5ML suspension Take 25 mLs (2,000 mg total) by mouth 2 (two) times daily for 14 days. 02/12/22 02/26/22  Terald Sleeperrifan, Matthew J, MD  ?calcium carbonate (TUMS - DOSED IN MG ELEMENTAL CALCIUM) 500 MG chewable  tablet Chew 1 tablet by mouth daily.    [provider]  ?clarithromycin (BIAXIN) 125 MG/5ML suspension Take 5 mLs (125 mg total) by mouth 2 (two) times daily for 14 days. 02/12/22 02/26/22  Terald Sleeperrifan, Matthew J, MD  ?fluticasone (FLONASE) 50 MCG/ACT nasal spray Place 2 sprays into both nostrils daily. ?Patient not taking: Reported on 02/13/2022 12/23/21   Barbette MerinoKing, Crystal M, NP  ?Lactulose 20 GM/30ML SOLN Take 15 mLs (10 g total) by mouth once a week. 12/27/21   Barbette MerinoKing, Crystal M, NP  ?mirtazapine (REMERON SOL-TAB) 15 MG disintegrating tablet Take 1 tablet (15 mg total) by mouth at bedtime. 12/23/21 03/23/22  Barbette MerinoKing, Crystal M, NP  ?Nutritional Supplements (ENSURE ACTIVE) LIQD Take 1 Bottle by mouth daily. 02/12/22 03/14/22  Terald Sleeperrifan, Matthew J, MD  ?omeprazole (FIRST-OMEPRAZOLE) 2 mg/mL SUSP oral suspension Take 10 mLs (20 mg total) by mouth daily for 14 days. 02/12/22 02/26/22  Terald Sleeperrifan, Matthew J, MD  ?simethicone (MYLICON) 125 MG chewable tablet Chew 125 mg by mouth every 6 (six) hours as needed for flatulence.    [provider]  ? ? ?Physical Exam: ?Vitals:  ? 02/23/22 0115 02/23/22 0130 02/23/22 0254 02/23/22 0256  ?BP: 133/83 134/85 129/79   ?Pulse: 95 89 90   ?Resp: (!) 25 20 15    ?Temp:    99.5 ?F (37.5 ?C)  ?TempSrc:    Axillary  ?SpO2: 94% 98% 97%   ?Weight:      ?Height:      ? ? ?Physical Exam ?Vitals reviewed.  ?Constitutional:   ?   General: She is not in acute distress. ?   Comments: Unable to examine the patient as she is uncooperative and constantly pushing me away.  ?HENT:  ?   Head: Normocephalic.  ?Eyes:  ?   Conjunctiva/sclera: Conjunctivae normal.  ?Neurological:  ?   Mental Status: She is alert.  ?   Comments: Oriented to self only ?No obvious facial droop ?Moving all extremities spontaneously, no focal weakness  ?  ?Labs on Admission: I have personally reviewed following labs and imaging studies ? ?CBC: ?Recent Labs  ?Lab 02/22/22 ?2326  ?WBC 10.1  ?NEUTROABS 8.1*  ?HGB 13.4  ?HCT 41.8  ?MCV 91.9   ?PLT 228  ? ?Basic Metabolic Panel: ?Recent Labs  ?Lab 02/22/22 ?2326  ?NA 140  ?K 4.1  ?CL 102  ?CO2 26  ?GLUCOSE 151*  ?BUN 15  ?CREATININE 1.06*  ?CALCIUM 9.4  ? ?GFR: ?Estimated Creatinine Clearance: 47 mL/min (A) (by C-G formula based on SCr of 1.06 mg/dL (H)). ?Liver Function Tests: ?Recent Labs  ?Lab 02/22/22 ?2326  ?AST 25  ?ALT 14  ?ALKPHOS 79  ?BILITOT 1.6*  ?PROT 7.7  ?ALBUMIN 3.5  ? ?No results for input(s): LIPASE, AMYLASE in the last 168 hours. ?No results for input(s): AMMONIA in the last 168 hours. ?Coagulation Profile: ?Recent Labs  ?Lab 02/23/22 ?0010  ?INR 1.3*  ? ?Cardiac Enzymes: ?No results for input(s): CKTOTAL, CKMB, CKMBINDEX, TROPONINI in the last 168 hours. ?BNP (last 3 results) ?  No results for input(s): PROBNP in the last 8760 hours. ?HbA1C: ?No results for input(s): HGBA1C in the last 72 hours. ?CBG: ?No results for input(s): GLUCAP in the last 168 hours. ?Lipid Profile: ?No results for input(s): CHOL, HDL, LDLCALC, TRIG, CHOLHDL, LDLDIRECT in the last 72 hours. ?Thyroid Function Tests: ?No results for input(s): TSH, T4TOTAL, FREET4, T3FREE, THYROIDAB in the last 72 hours. ?Anemia Panel: ?No results for input(s): VITAMINB12, FOLATE, FERRITIN, TIBC, IRON, RETICCTPCT in the last 72 hours. ?Urine analysis: ?   ?Component Value Date/Time  ? COLORURINE YELLOW 02/12/2022 0124  ? APPEARANCEUR HAZY (A) 02/12/2022 0124  ? LABSPEC >1.030 (H) 02/12/2022 0124  ? PHURINE 5.5 02/12/2022 0124  ? GLUCOSEU NEGATIVE 02/12/2022 0124  ? HGBUR TRACE (A) 02/12/2022 0124  ? BILIRUBINUR NEGATIVE 02/12/2022 0124  ? KETONESUR 15 (A) 02/12/2022 0124  ? PROTEINUR NEGATIVE 02/12/2022 0124  ? NITRITE POSITIVE (A) 02/12/2022 0124  ? LEUKOCYTESUR TRACE (A) 02/12/2022 0124  ? ? ?Radiological Exams on Admission: I have personally reviewed images ?DG Chest 2 View ? ?Result Date: 02/22/2022 ?CLINICAL DATA:  Sepsis, lower back and right flank pain, urinary tract infection EXAM: CHEST - 2 VIEW COMPARISON:  12/06/2019  FINDINGS: Frontal and lateral views of the chest demonstrate an unremarkable cardiac silhouette. There is patchy airspace disease within the periphery of the right mid and lower lung zones. No effusion or pneumothorax

## 2022-02-23 NOTE — ED Notes (Signed)
Pharmacy notified me that the heparin has been stopped and lovenox has been ordered ?

## 2022-02-23 NOTE — ED Notes (Signed)
Iv team here 

## 2022-02-23 NOTE — ED Notes (Signed)
Echo at BS 

## 2022-02-23 NOTE — ED Notes (Signed)
Pts iv has infiltrated calling iv team ?

## 2022-02-23 NOTE — ED Notes (Signed)
Unable to get the nurse coming on  no message back to my inquiry   I called the floor and was told that the  charge nurse had not assigned a nurse yet   unable to complete red man  ?

## 2022-02-23 NOTE — Progress Notes (Signed)
VASCULAR LAB ? ? ? ?Bilateral lower extremity venous duplex has been performed. ? ?See CV proc for preliminary results. ? ? ?Akhila Mahnken, RVT ?02/23/2022, 1:07 PM ? ?

## 2022-02-23 NOTE — ED Notes (Signed)
Pt refusing oral temperature, swatting at the RN. Pt educated. RN obtained axillary temp of 99.5 ?

## 2022-02-23 NOTE — Progress Notes (Signed)
ANTICOAGULATION CONSULT NOTE - Follow Up Consult ? ?Pharmacy Consult for Heparin > lovenox  ?Indication: rule out PE ? ?No Known Allergies ? ?Patient Measurements: ?Height: 5\' 8"  (172.7 cm) ?Weight: 65.1 kg (143 lb 8.3 oz) ?IBW/kg (Calculated) : 63.9 ? ?Vital Signs: ?BP: 129/96 (04/16 1800) ?Pulse Rate: 82 (04/16 1700) ? ?Labs: ?Recent Labs  ?  02/22/22 ?2326 02/23/22 ?0010 02/23/22 ?0515  ?HGB 13.4  --  10.2*  ?HCT 41.8  --  30.2*  ?PLT 228  --  158  ?LABPROT  --  16.4*  --   ?INR  --  1.3*  --   ?CREATININE 1.06*  --  0.73  ? ? ? ?Estimated Creatinine Clearance: 62.2 mL/min (by C-G formula based on SCr of 0.73 mg/dL). ? ? ?Assessment: ?76 year old female currently on IV heparin until PE can be ruled out. Of note, she is an extremely hard lab stick. RN and lab tech unsuccessful at drawing a heparin level. Now switching to Lovenox.  ? ?H/H low, Plt wnl, SCr wnl  ? ?Goal of Therapy:  ?Anti-Xa level 0.6-1 units/ml 4hrs after LMWH dose given ?Monitor platelets by anticoagulation protocol: Yes ?  ?Plan:  ?Stop IV heparin and start Lovenox 1 mg/kg q 12 hours ?Monitor CBC, renal fx and watch for s/s of bleeding ?F/u AM chest CT results  ? ?66, PharmD., BCCCP ?Clinical Pharmacist ?Please refer to AMION for unit-specific pharmacist  ? ? ? ?

## 2022-02-23 NOTE — ED Notes (Signed)
Pt sleeping, heparin restarted, IVF infusing. VSS.  ?

## 2022-02-23 NOTE — Progress Notes (Signed)
Pharmacy Note ? ?Heparin had been paused while awaiting CT head; CT negative >> resume heparin infusion at 1000 units/hr. ? ?Vernard Gambles, PharmD, BCPS ?02/23/2022 7:06 AM  ?

## 2022-02-23 NOTE — ED Notes (Signed)
Daughters x2 at [facility] ?

## 2022-02-23 NOTE — ED Notes (Signed)
Heparin paused per Loney Loh, MD.  ?

## 2022-02-23 NOTE — Progress Notes (Signed)
ANTICOAGULATION CONSULT NOTE - Follow Up Consult ? ?Pharmacy Consult for Heparin ?Indication: rule out PE ? ?No Known Allergies ? ?Patient Measurements: ?Height: 5\' 8"  (172.7 cm) ?Weight: 65.1 kg (143 lb 8.3 oz) ?IBW/kg (Calculated) : 63.9 ? ?Vital Signs: ?Temp: 99.5 ?F (37.5 ?C) (04/16 0256) ?Temp Source: Axillary (04/16 0256) ?BP: 129/79 (04/16 0254) ?Pulse Rate: 90 (04/16 0254) ? ?Labs: ?Recent Labs  ?  02/22/22 ?2326 02/23/22 ?0010  ?HGB 13.4  --   ?HCT 41.8  --   ?PLT 228  --   ?LABPROT  --  16.4*  ?INR  --  1.3*  ?CREATININE 1.06*  --   ? ? ?Estimated Creatinine Clearance: 47 mL/min (A) (by C-G formula based on SCr of 1.06 mg/dL (H)). ? ? ?Assessment: ?76 year old female to begin heparin until a PE can be ruled out ? ?Goal of Therapy:  ?Heparin level 0.3-0.7 units/ml ?Monitor platelets by anticoagulation protocol: Yes ?  ?Plan:  ?Heparin 3000 units iv bolus x 1 ?Heparin drip at 1000 units / hr ?Heparin level and CBC in 6 hours ? ?Thank you ?Anette Guarneri, PharmD ? ?02/23/2022,3:00 AM ? ? ?

## 2022-02-23 NOTE — ED Notes (Signed)
Pt back from CT

## 2022-02-24 ENCOUNTER — Inpatient Hospital Stay (HOSPITAL_COMMUNITY): Payer: Medicare Other

## 2022-02-24 DIAGNOSIS — R609 Edema, unspecified: Secondary | ICD-10-CM | POA: Diagnosis not present

## 2022-02-24 DIAGNOSIS — I2609 Other pulmonary embolism with acute cor pulmonale: Secondary | ICD-10-CM | POA: Diagnosis not present

## 2022-02-24 DIAGNOSIS — R4182 Altered mental status, unspecified: Secondary | ICD-10-CM | POA: Diagnosis not present

## 2022-02-24 LAB — CBC
HCT: 35.7 % — ABNORMAL LOW (ref 36.0–46.0)
Hemoglobin: 12 g/dL (ref 12.0–15.0)
MCH: 29.1 pg (ref 26.0–34.0)
MCHC: 33.6 g/dL (ref 30.0–36.0)
MCV: 86.7 fL (ref 80.0–100.0)
Platelets: 210 10*3/uL (ref 150–400)
RBC: 4.12 MIL/uL (ref 3.87–5.11)
RDW: 15.1 % (ref 11.5–15.5)
WBC: 9.1 10*3/uL (ref 4.0–10.5)
nRBC: 0 % (ref 0.0–0.2)

## 2022-02-24 LAB — BASIC METABOLIC PANEL
Anion gap: 6 (ref 5–15)
BUN: <5 mg/dL — ABNORMAL LOW (ref 8–23)
CO2: 29 mmol/L (ref 22–32)
Calcium: 8.5 mg/dL — ABNORMAL LOW (ref 8.9–10.3)
Chloride: 102 mmol/L (ref 98–111)
Creatinine, Ser: 0.67 mg/dL (ref 0.44–1.00)
GFR, Estimated: >60 mL/min (ref >60)
Glucose, Bld: 131 mg/dL — ABNORMAL HIGH (ref 70–99)
Potassium: 3.2 mmol/L — ABNORMAL LOW (ref 3.5–5.1)
Sodium: 137 mmol/L (ref 135–145)

## 2022-02-24 LAB — ECHOCARDIOGRAM COMPLETE
Area-P 1/2: 2.11 cm2
Height: 68 in
S' Lateral: 2.6 cm
Weight: 2296.31 oz

## 2022-02-24 LAB — STREP PNEUMONIAE URINARY ANTIGEN: Strep Pneumo Urinary Antigen: NEGATIVE

## 2022-02-24 MED ORDER — POTASSIUM CHLORIDE 20 MEQ PO PACK
40.0000 meq | PACK | Freq: Once | ORAL | Status: DC
  Filled 2022-02-24: qty 2

## 2022-02-24 MED ORDER — IOHEXOL 350 MG/ML SOLN
100.0000 mL | Freq: Once | INTRAVENOUS | Status: AC | PRN
  Administered 2022-02-24: 100 mL via INTRAVENOUS

## 2022-02-24 MED ORDER — KCL IN DEXTROSE-NACL 40-5-0.45 MEQ/L-%-% IV SOLN
INTRAVENOUS | Status: DC
  Filled 2022-02-24 (×7): qty 1000

## 2022-02-24 MED ORDER — MORPHINE SULFATE (PF) 2 MG/ML IV SOLN
2.0000 mg | Freq: Once | INTRAVENOUS | Status: AC
Start: 2022-02-25 — End: 2022-02-24
  Administered 2022-02-24: 2 mg via INTRAVENOUS
  Filled 2022-02-24: qty 1

## 2022-02-24 NOTE — Progress Notes (Signed)
Patient is from home with her daughter. CM met with the patient and she was not able to answer questions about her home environment. CM asked to call her daughter and she was in agreement. CM has left a voicemail for her daughter.  ?TOC following. ?

## 2022-02-24 NOTE — Progress Notes (Signed)
VASCULAR LAB ? ? ? ?Left upper extremity venous duplex has been performed. ? ?See CV proc for preliminary results. ? ? ?Tommie Bohlken, RVT ?02/24/2022, 10:45 AM ? ?

## 2022-02-24 NOTE — Evaluation (Signed)
Clinical/Bedside Swallow Evaluation ?Patient Details  ?Name: Monica Erickson ?MRN: [id number] ?Date of Birth: 1946-09-24 ? ?Today's Date: 02/24/2022 ?Time: SLP Start Time (ACUTE ONLY): [id number] SLP Stop Time (ACUTE ONLY): 0947 ?SLP Time Calculation (min) (ACUTE ONLY): 18 min ? ?Past Medical History:  ?Past Medical History:  ?Diagnosis Date  ? Chronic headaches   ? Dementia (HCC)   ? ALZHEIMERS FOR A YEAR  ? Depression   ? Difficult intravenous access 2021  ? needs anesthesia , IV team, ultrasound for IV   ? Gastric ulcer   ? H. pylori infection 03/2020  ? Kidney stones   ? Night terrors   ? Sleep apnea   ? no cpap  ? ?Past Surgical History:  ?Past Surgical History:  ?Procedure Laterality Date  ? BIOPSY  03/22/2020  ? Procedure: BIOPSY;  Surgeon: Rachael Fee, MD;  Location: [facility];  Service: Endoscopy;;  ? Breast Lift    ? CATARACT EXTRACTION Bilateral 01/2020  ? COLONOSCOPY WITH PROPOFOL N/A 03/22/2020  ? Procedure: COLONOSCOPY WITH PROPOFOL;  Surgeon: Rachael Fee, MD;  Location: [facility];  Service: Endoscopy;  Laterality: N/A;  ? CYSTOSCOPY WITH STENT PLACEMENT Right 12/07/2018  ? Procedure: CYSTOSCOPY WITH STENT PLACEMENT;  Surgeon: Crist Fat, MD;  Location: [facility];  Service: Urology;  Laterality: Right;  ? CYSTOSCOPY/URETEROSCOPY/HOLMIUM LASER/STENT PLACEMENT Right 12/31/2018  ? Procedure: CYSTOSCOPY RIGHT URETEROSCOPY/HOLMIUM LASER RIGHT Lucretia Roers;  Surgeon: Crist Fat, MD;  Location: [facility];  Service: Urology;  Laterality: Right;  ? ESOPHAGOGASTRODUODENOSCOPY (EGD) WITH PROPOFOL N/A 03/22/2020  ? Procedure: ESOPHAGOGASTRODUODENOSCOPY (EGD) WITH PROPOFOL;  Surgeon: Rachael Fee, MD;  Location: [facility];  Service: Endoscopy;  Laterality: N/A;  ? REPLACEMENT TOTAL KNEE Bilateral 5 YRS AGO  ? Tummy tuck    ? ?HPI:  ?76 y.o. female admitted with sepsis secondary to CAP, acute metabolic encephalopathy, possible PE, anorexia.  Prior medical history  significant of dementia, chronic headaches, depression, gastric ulcer, H. pylori infection, nephrolithiasis, sleep apnea not on CPAP.  Recently seen in the ED on 4/5 for dehydration, UTI, and possible H. pylori infection. Has been followed by GI recently. Hx of poor PO intake. Family has declined G tube in the past.  ?  ?Assessment / Plan / Recommendation  ?Clinical Impression ? Pt participated in limited clinical swallow assessment due to politely declining most POs.  She appeared to be in pain but her MS did not allow her to specify or answer yes/no questions.  She accepted ice chips, sips of water, and one teaspoon of applesauce with no obvious deficits, no s/s of aspiration, no difficulty with oral manipulation.  She declined solids and further POs. There were no focal deficits.  She did not follow commands and oral mechanism exam could not be completed.  Recommend allowing a regular diet, thin liquids; meds whole in liquid. No obvious dysphagia. Our service will sign off. ?SLP Visit Diagnosis: Dysphagia, unspecified (R13.10) ?   ?Aspiration Risk ? No limitations  ?  ?Diet Recommendation   Regular diet as tolerated ? ?Medication Administration: Whole meds with liquid  ?  ?Other  Recommendations Oral Care Recommendations: Oral care BID   ? ?Recommendations for follow up therapy are one component of a multi-disciplinary discharge planning process, led by the attending physician.  Recommendations may be updated based on patient status, additional functional criteria and insurance authorization. ? ?Follow up Recommendations No SLP follow up  ? ? ?  ? ? ?Swallow Study   ?General  HPI: 76 y.o. female admitted with sepsis secondary to CAP, acute metabolic encephalopathy, possible PE, anorexia.  Prior medical history significant of dementia, chronic headaches, depression, gastric ulcer, H. pylori infection, nephrolithiasis, sleep apnea not on CPAP.  Recently seen in the ED on 4/5 for dehydration, UTI, and possible H.  pylori infection. ?Type of Study: Bedside Swallow Evaluation ?Previous Swallow Assessment: no ?Diet Prior to this Study: NPO ?Temperature Spikes Noted: No ?Respiratory Status: Room air ?History of Recent Intubation: No ?Behavior/Cognition: Alert;Confused ?Oral Cavity Assessment:  (unable to f/c) ?Oral Care Completed by SLP: Recent completion by staff ?Oral Cavity - Dentition:  (unknown) ?Self-Feeding Abilities: Needs assist ?Patient Positioning: Upright in bed ?Baseline Vocal Quality: Normal ?Volitional Cough: Cognitively unable to elicit ?Volitional Swallow: Unable to elicit  ?  ?Oral/Motor/Sensory Function Overall Oral Motor/Sensory Function: Other (comment) (symmetric at baseline)   ?Ice Chips Ice chips: Within functional limits   ?Thin Liquid Thin Liquid: Within functional limits  ?  ?Nectar Thick Nectar Thick Liquid: Not tested   ?Honey Thick Honey Thick Liquid: Not tested   ?Puree Puree: Within functional limits   ?Solid ? ? ?  Solid: Not tested (pt declined)  ? ?  ? ?Blenda Mounts Laurice ?02/24/2022,9:54 AM ? ?Palin Tristan L. Carnelia Oscar, MA CCC/SLP ?Acute Rehabilitation Services ?Office number [phone] ?Pager [phone] ? ? ?

## 2022-02-24 NOTE — Progress Notes (Signed)
?Progress Note ? ?Patient: Monica Erickson VOJ:[id number] DOB: 10-15-1946  ?DOA: 02/22/2022  DOS: 02/24/2022  ?  ?Brief hospital course: ?Monica Erickson is a 75 y.o. female with a history of dementia, H. pylori infection, OSA not on CPAP, and recent ED evaluation for UTI and dehydration who presented to the ED 4/15 with confusion, somnolence, and flank pain. She was afebrile with tachycardia, soft BP, WBC 10.1k, Cr 1. Lactic acid 2.5 > 1.8 with LR boluses. CXR showed right mid and lower opacities concerning for bronchopneumonia. CT abd/pelvis (due to flank pain, hx nephrolithiasis) was performed with contrast did not reveal urinary abnormality, but confirmed right base airspace opacity with small pleural effusion, and hypodensity in right pulmonary artery concerning for PE. Heparin was started after head CT confirmed no hemorrhage, and antibiotics have been continued. CTA chest confirms large clot burden of PE with evidence of right heart strain for which parenteral anticoagulation is continued and echocardiogram is ordered.  ? ?Assessment and Plan: ?Sepsis due to Right sided bronchopneumonia with parapneumonic effusion: Lactic acid cleared with fluids.  ?- Continue CTX, azithromycin.  ?- Urine antigens to strep and legionella pending ?- Blood cultures NGTD ?- SLP evaluation limited by cooperation but no restrictions recommended after no deficits noted.  ?- Stable on room air ?  ?Bilateral, predominantly right sided PEs: Appearing somewhat chronic with suggestion of infarct pattern which may explain RUQ/R lower chest pain. No DVT on UE or LE venous U/S.  ?- Continue lovenox. With patient's oral aversion, we may need to consider chronic lovenox therapy to ensure adequate anticoagulation. Will pursue benefit check. ?  ?Acute metabolic encephalopathy on chronic dementia: Unclear where she's at regarding her baseline at this time. TSH 1.01, B12 210, ammonia 12. Head CT non acute. Urinalysis shows few bacteria and no WBCs not  consistent with infection. ?- Delirium precautions ?  ?Hypokalemia:  ?- Supplemented, will continue IVF pending echo, increase concentration. the patient did not take oral supplementation.  ?  ?AKI: Cr 1.06, down to 0.67 with IV boluses and fluids.  ?- Not taking significant po intake, will continue lower rate IVF. ?  ?Poor oral intake, hypoalbuminemia: Not a new issue. Family has declined consideration for G tube in the past.  ?- With suspicion for this to be ongoing and lower B12 level, will supplement at this time ?  ?Hyperbilirubinemia: Resolved. ? ?Subjective: Pt has no complaints, is too confused to reliably report symptoms. Nods head yes to pain but without further characterization. ? ?Objective: ?Vitals:  ? 02/23/22 2332 02/24/22 0306 02/24/22 0840 02/24/22 1157  ?BP: (!) 157/80 (!) 167/95 (!) 153/86 (!) 143/84  ?Pulse:   76 63  ?Resp: 17 16 14 14   ?Temp: 98.7 ?F (37.1 ?C) 98.4 ?F (36.9 ?C)  98.9 ?F (37.2 ?C)  ?TempSrc: Axillary Axillary  Oral  ?SpO2: 100% 100% 100% 93%  ?Weight:      ?Height:      ? ?Gen: Elderly female in no distress ?Pulm: Nonlabored breathing room air. Clear ?CV: Regular rate and rhythm. No murmur, rub, or gallop. No heave or JVD, no pitting dependent edema. ?GI: Abdomen soft, non-tender, non-distended, with normoactive bowel sounds.  ?Ext: Warm, no deformities ?Skin: No rashes, lesions or ulcers on visualized skin. ?Neuro: Alert and disoriented, not cooperative with exam.  ?Psych: Judgement and insight appear impaired. Calm.  ? ?Data Personally reviewed: ?CBC: ?Recent Labs  ?Lab 02/22/22 ?2326 02/23/22 ?02/25/22 02/24/22 ?02/26/22  ?WBC 10.1 8.3 9.1  ?NEUTROABS 8.1*  --   --   ?  HGB 13.4 10.2* 12.0  ?HCT 41.8 30.2* 35.7*  ?MCV 91.9 90.1 86.7  ?PLT 228 158 210  ? ?Basic Metabolic Panel: ?Recent Labs  ?Lab 02/22/22 ?2326 02/23/22 ?6644 02/24/22 ?0753  ?NA 140 138 137  ?K 4.1 3.2* 3.2*  ?CL 102 104 102  ?CO2 26 25 29   ?GLUCOSE 151* 96 131*  ?BUN 15 11 <5*  ?CREATININE 1.06* 0.73 0.67  ?CALCIUM 9.4  8.1* 8.5*  ? ?GFR: ?Estimated Creatinine Clearance: 62.2 mL/min (by C-G formula based on SCr of 0.67 mg/dL). ?Liver Function Tests: ?Recent Labs  ?Lab 02/22/22 ?2326 02/23/22 ?0515  ?AST 25 14*  ?ALT 14 8  ?ALKPHOS 79 56  ?BILITOT 1.6* 0.8  ?PROT 7.7 4.9*  ?ALBUMIN 3.5 2.2*  ? ?No results for input(s): LIPASE, AMYLASE in the last 168 hours. ?Recent Labs  ?Lab 02/23/22 ?02/25/22  ?AMMONIA 12  ? ?Coagulation Profile: ?Recent Labs  ?Lab 02/23/22 ?0010  ?INR 1.3*  ? ?Cardiac Enzymes: ?No results for input(s): CKTOTAL, CKMB, CKMBINDEX, TROPONINI in the last 168 hours. ?BNP (last 3 results) ?No results for input(s): PROBNP in the last 8760 hours. ?HbA1C: ?No results for input(s): HGBA1C in the last 72 hours. ?CBG: ?No results for input(s): GLUCAP in the last 168 hours. ?Lipid Profile: ?No results for input(s): CHOL, HDL, LDLCALC, TRIG, CHOLHDL, LDLDIRECT in the last 72 hours. ?Thyroid Function Tests: ?Recent Labs  ?  02/23/22 ?02/25/22  ?TSH 1.010  ? ?Anemia Panel: ?Recent Labs  ?  02/23/22 ?0515  ?VITAMINB12 210  ? ?Urine analysis: ?   ?Component Value Date/Time  ? COLORURINE AMBER (A) 02/23/2022 0247  ? APPEARANCEUR CLEAR 02/23/2022 0247  ? LABSPEC >1.030 (H) 02/23/2022 0247  ? PHURINE 6.0 02/23/2022 0247  ? GLUCOSEU NEGATIVE 02/23/2022 0247  ? HGBUR NEGATIVE 02/23/2022 0247  ? BILIRUBINUR SMALL (A) 02/23/2022 0247  ? KETONESUR NEGATIVE 02/23/2022 0247  ? PROTEINUR 30 (A) 02/23/2022 0247  ? NITRITE NEGATIVE 02/23/2022 0247  ? LEUKOCYTESUR NEGATIVE 02/23/2022 0247  ? ?Recent Results (from the past 240 hour(s))  ?Culture, blood (Routine x 2)     Status: None (Preliminary result)  ? Collection Time: 02/22/22 11:27 PM  ? Specimen: BLOOD RIGHT ARM  ?Result Value Ref Range Status  ? Specimen Description BLOOD RIGHT ARM  Final  ? Special Requests   Final  ?  BOTTLES DRAWN AEROBIC AND ANAEROBIC Blood Culture results may not be optimal due to an inadequate volume of blood received in culture bottles  ? Culture   Final  ?  NO GROWTH 2  DAYS ?Performed at [facility], [address] [place], [place] ?  ? Report Status PENDING  Incomplete  ?Culture, blood (Routine x 2)     Status: None (Preliminary result)  ? Collection Time: 02/23/22 12:02 AM  ? Specimen: BLOOD LEFT ARM  ?Result Value Ref Range Status  ? Specimen Description BLOOD LEFT ARM  Final  ? Special Requests   Final  ?  BOTTLES DRAWN AEROBIC AND ANAEROBIC Blood Culture adequate volume  ? Culture   Final  ?  NO GROWTH 1 DAY ?Performed at [facility], [address] [place], [place] ?  ? Report Status PENDING  Incomplete  ?   ?DG Chest 2 View ? ?Result Date: 02/22/2022 ?CLINICAL DATA:  Sepsis, lower back and right flank pain, urinary tract infection EXAM: CHEST - 2 VIEW COMPARISON:  12/06/2019 FINDINGS: Frontal and lateral views of the chest demonstrate an unremarkable cardiac silhouette. There  is patchy airspace disease within the periphery of the right mid and lower lung zones. No effusion or pneumothorax. No acute bony abnormality. IMPRESSION: 1. Patchy airspace disease within the right mid and lower lung zones consistent with bronchopneumonia. Electronically Signed   By: Sharlet SalinaMichael  Brown M.D.   On: 02/22/2022 22:17  ? ?CT HEAD WO CONTRAST (5MM) ? ?Result Date: 02/23/2022 ?CLINICAL DATA:  76 year old female with history of delirium. EXAM: CT HEAD WITHOUT CONTRAST TECHNIQUE: Contiguous axial images were obtained from the base of the skull through the vertex without intravenous contrast. RADIATION DOSE REDUCTION: This exam was performed according to the departmental dose-optimization program which includes automated exposure control, adjustment of the mA and/or kV according to patient size and/or use of iterative reconstruction technique. COMPARISON:  Head CT 06/12/2020. FINDINGS: Brain: Moderate cerebral and mild cerebellar atrophy. Patchy and confluent areas of decreased attenuation are noted throughout the deep and periventricular white matter of the  cerebral hemispheres bilaterally, compatible with chronic microvascular ischemic disease. Extensive physiologic calcifications are noted in the basal ganglia bilaterally. No evidence of acute infarction, hemorrhag

## 2022-02-25 ENCOUNTER — Other Ambulatory Visit (HOSPITAL_COMMUNITY): Payer: Self-pay

## 2022-02-25 DIAGNOSIS — F0390 Unspecified dementia without behavioral disturbance: Secondary | ICD-10-CM | POA: Diagnosis not present

## 2022-02-25 DIAGNOSIS — I2699 Other pulmonary embolism without acute cor pulmonale: Secondary | ICD-10-CM | POA: Diagnosis not present

## 2022-02-25 DIAGNOSIS — R627 Adult failure to thrive: Secondary | ICD-10-CM | POA: Diagnosis present

## 2022-02-25 DIAGNOSIS — A419 Sepsis, unspecified organism: Secondary | ICD-10-CM | POA: Diagnosis present

## 2022-02-25 DIAGNOSIS — E876 Hypokalemia: Secondary | ICD-10-CM | POA: Diagnosis present

## 2022-02-25 DIAGNOSIS — R4182 Altered mental status, unspecified: Secondary | ICD-10-CM | POA: Diagnosis not present

## 2022-02-25 LAB — CBC
HCT: 31.8 % — ABNORMAL LOW (ref 36.0–46.0)
Hemoglobin: 10.5 g/dL — ABNORMAL LOW (ref 12.0–15.0)
MCH: 28.9 pg (ref 26.0–34.0)
MCHC: 33 g/dL (ref 30.0–36.0)
MCV: 87.6 fL (ref 80.0–100.0)
Platelets: 195 10*3/uL (ref 150–400)
RBC: 3.63 MIL/uL — ABNORMAL LOW (ref 3.87–5.11)
RDW: 15 % (ref 11.5–15.5)
WBC: 6.5 10*3/uL (ref 4.0–10.5)
nRBC: 0 % (ref 0.0–0.2)

## 2022-02-25 LAB — LEGIONELLA PNEUMOPHILA SEROGP 1 UR AG: L. pneumophila Serogp 1 Ur Ag: NEGATIVE

## 2022-02-25 MED ORDER — ENOXAPARIN SODIUM 100 MG/ML IJ SOSY
100.0000 mg | PREFILLED_SYRINGE | INTRAMUSCULAR | Status: DC
  Administered 2022-02-26 – 2022-03-06 (×6): 100 mg via SUBCUTANEOUS
  Filled 2022-02-25 (×9): qty 1

## 2022-02-25 NOTE — Plan of Care (Signed)

## 2022-02-25 NOTE — Progress Notes (Signed)
Patient refused morning PO meds, fluids and breakfast. Patient educated and MD aware. Patient is alert and responsive. Breaths are even and unlabored. HR & BP WDL. ?

## 2022-02-25 NOTE — Progress Notes (Signed)
?PROGRESS NOTE ? ? ? ?Monica Erickson  WC:[id number] DOB: 1946/01/05 DOA: 02/22/2022 ?PCP: Vevelyn Francois, NP  ? ? ?Brief Narrative:  ? ?Monica Erickson is a 76 y.o. female with medical history significant of dementia, chronic headaches, depression, gastric ulcer, H. pylori infection, nephrolithiasis, sleep apnea not on CPAP who presented to [facility] ED on 4/15 for evaluation of confusion, somnolence, and flank pain.  Patient is oriented to self only and not able to give any history. ? ?Recently seen in the ED on 4/5 for dehydration, UTI, and possible H. pylori infection.  GI recommended outpatient follow-up. ? ?In the ED, patient was found to be slightly tachycardic on arrival with low blood pressure in the 90s. Not febrile.  WBC 10.1.  BUN 15, creatinine 1.0.  T. bili 1.6, remainder of LFTs normal.  Lactic acid 2.5.  INR 1.3.  Blood cultures drawn.  UA pending.  Chest x-Mcgahee showing patchy airspace disease within the right mid and lower lung zones consistent with bronchopneumonia.  CT abdomen pelvis with contrast negative for renal calculus or obstructive uropathy.  Showing trace right pleural effusion with patchy airspace disease at the right lung base concerning for pneumonia.  Also showing prominent right hilum with a hypodense region in the right pulmonary artery only partially visualized on exam, possible PE versus prominent lymph nodes.  Dedicated CT angiogram chest recommended for further evaluation.  Patient was given fentanyl, IV heparin, ceftriaxone, azithromycin, and 2 L LR boluses.  Repeat lactate 1.8.  Hospital service was consulted for further evaluation and management of altered mental status/confusion with concern for pneumonia versus PE.  ? ? ? ?Assessment & Plan: ?  ? ?Sepsis, POA ?Right-sided bronchopneumonia ?Patient presenting with elevated WBC count of 10.2, chest x-Schwier with evidence of right-sided pneumonia.  Strep pneumo urinary antigen: Negative.  Given patient's presenting symptoms of confusion greater  than her typical baseline we will continue treatment for pneumonia x5 days. ?--Azithromycin 5 mg IV q24h x 5 days ?--Ceftriaxone 2 g IV q24h x 5 days ? ?Bilateral pulmonary embolism with pulmonary infarct ?CT angiogram chest with extensive chronic appearing pulmonary emboli within the distal right pulmonary artery extending to the right middle lobe and right lower lobar and segmental pulmonary arteries, segmental/subsegmental pulm emboli noted within the left lower lobe which are age-indeterminate, CT evidence of right heart strain.  Patient was initially started on heparin drip and now transition to Lovenox.  TTE with preserved LVEF, RV function within normal limits. ?--Will transition Lovenox to 1.5 mg/kg once daily dosing for ease of administration by family ?--TOC for benefit check for Lovenox; patient unlikely to comply with oral medications due to diversion and refusing oral intake ? ?Acute metabolic encephalopathy ?Hx dementia ?Patient presenting from home with confusion in the setting of history of advanced dementia.  Patient appears to be at baseline, but concern for worsening dementia/progression given her poor oral intake.  Treating pneumonia/PE as above.  Vitamin B12 210, TSH 1.010, urinalysis unrevealing. ?--Delirium precautions ?--Get up during the day ?--Encourage a familiar face to remain present throughout the day ?--Keep blinds open and lights on during daylight hours ?--Minimize the use of opioids/benzodiazepines ? ?Hypokalemia ?Repleted. ?--Repeat electrolytes in a.m. ? ?Acute renal failure ?Creatinine 1.06 on admission, supported with IV fluid hydration with improvement of creatinine to 0.67. ?--Avoid nephrotoxins, renal dose all medications ?--Repeat BMP in a.m. ? ?Hyperbilirubinemia ?Resolved. ? ?Adult failure to thrive ?Hypoalbuminemia ?Etiology likely secondary to progressive dementia.  Family reports poor oral intake at baseline.  Has declined consideration for G-tube in the past.   Discussed with patient's daughter, [name] regarding the progressive nature of dementia as she is likely nearing end-stage. ?--Palliative care consulted for assistance with goals of care/medical decision making ? ? ? ? ?DVT prophylaxis:   Lovenox ?  Code Status: Full Code ?Family Communication: Updated patient's daughter, [name] extensively via telephone this morning ? ?Disposition Plan:  ?Level of care: Telemetry Medical ?Status is: Inpatient ?Remains inpatient appropriate because: Family will need education regarding Lovenox administration, pending PT/OT evaluation, palliative care consult pending, anticipate discharge home likely once completes IV antibiotics given her aversion for oral intake ?  ? ?Consultants:  ?Palliative care ? ?Procedures:  ?None ? ?Antimicrobials:  ?Azithromycin 4/16>> ?Ceftriaxone 4/16>> ? ? ?Subjective: ?Patient seen examined bedside, resting comfortably.  Pleasantly confused.  No family present.  Denies musculoskeletal pain, no shortness of breath, no chest pain.  Updated patient's daughter extensively via telephone this morning, concerned about her likely progression of dementia leading to her worsening oral intake.  Discussed that she has been refusing treatment with oral medications, IV medications and refused 1 dose of Lovenox per nursing staff.  Discussed with patient's daughter the natural progression of dementia, and highly concerned that she is nearing end-stage and likely will become a hospice candidate.  Will have palliative care evaluate for assistance with goals of care and medical decision making.  No other questions or concerns at this time.  No other acute concerns overnight per nursing staff. ? ?Objective: ?Vitals:  ? 02/24/22 1905 02/24/22 2315 02/25/22 0441 02/25/22 0759  ?BP: 123/85 112/60 123/72 119/64  ?Pulse: 81 88 71 72  ?Resp: 17 16 13 20   ?Temp:  98.3 ?F (36.8 ?C) 98.4 ?F (36.9 ?C) 98.2 ?F (36.8 ?C)  ?TempSrc:  Axillary Axillary Oral  ?SpO2: 96% 98% 100% 98%  ?Weight:       ?Height:      ? ? ?Intake/Output Summary (Last 24 hours) at 02/25/2022 1123 ?Last data filed at 02/25/2022 0441 ?Gross per 24 hour  ?Intake 2067.67 ml  ?Output 720 ml  ?Net 1347.67 ml  ? ?Filed Weights  ? 02/23/22 0035  ?Weight: 65.1 kg  ? ? ?Examination: ? ?Physical Exam: ?GEN: NAD, alert, pleasantly confused ?HEENT: NCAT, PERRL, EOMI, sclera clear, MMM ?PULM: CTAB w/o wheezes/crackles, normal respiratory effort, on room air ?CV: RRR w/o M/G/R ?GI: abd soft, NTND, NABS, no R/G/M ?MSK: no peripheral edema, moves all extremities independently ?NEURO: CN II-XII intact, no focal deficits, sensation to light touch intact ?PSYCH: normal mood/affect ?Integumentary: dry/intact, no rashes or wounds ? ? ? ?Data Reviewed: I have personally reviewed following labs and imaging studies ? ?CBC: ?Recent Labs  ?Lab 02/22/22 ?2326 02/23/22 ?[id number] 02/24/22 ?[id number] 02/25/22 ?[id number]  ?WBC 10.1 8.3 9.1 6.5  ?NEUTROABS 8.1*  --   --   --   ?HGB 13.4 10.2* 12.0 10.5*  ?HCT 41.8 30.2* 35.7* 31.8*  ?MCV 91.9 90.1 86.7 87.6  ?PLT 228 158 210 195  ? ?Basic Metabolic Panel: ?Recent Labs  ?Lab 02/22/22 ?2326 02/23/22 ?[id number] 02/24/22 ?0753  ?NA 140 138 137  ?K 4.1 3.2* 3.2*  ?CL 102 104 102  ?CO2 26 25 29   ?GLUCOSE 151* 96 131*  ?BUN 15 11 <5*  ?CREATININE 1.06* 0.73 0.67  ?CALCIUM 9.4 8.1* 8.5*  ? ?GFR: ?Estimated Creatinine Clearance: 62.2 mL/min (by C-G formula based on SCr of 0.67 mg/dL). ?Liver Function Tests: ?Recent Labs  ?Lab 02/22/22 ?2326 02/23/22 ?0515  ?AST 25 14*  ?ALT  14 8  ?ALKPHOS 79 56  ?BILITOT 1.6* 0.8  ?PROT 7.7 4.9*  ?ALBUMIN 3.5 2.2*  ? ?No results for input(s): LIPASE, AMYLASE in the last 168 hours. ?Recent Labs  ?Lab 02/23/22 ?[id number]  ?AMMONIA 12  ? ?Coagulation Profile: ?Recent Labs  ?Lab 02/23/22 ?0010  ?INR 1.3*  ? ?Cardiac Enzymes: ?No results for input(s): CKTOTAL, CKMB, CKMBINDEX, TROPONINI in the last 168 hours. ?BNP (last 3 results) ?No results for input(s): PROBNP in the last 8760 hours. ?HbA1C: ?No results for  input(s): HGBA1C in the last 72 hours. ?CBG: ?No results for input(s): GLUCAP in the last 168 hours. ?Lipid Profile: ?No results for input(s): CHOL, HDL, LDLCALC, TRIG, CHOLHDL, LDLDIRECT in the last 72 hours. ?[place]

## 2022-02-25 NOTE — TOC Benefit Eligibility Note (Addendum)
Patient Advocate Encounter ? ?Insurance verification completed.   ? ?The patient is currently admitted and upon discharge could be taking enoxaparin (Lovenox) 60 mg/0.6 ml injection. ? ?The current 30 day co-pay is, $100.00.  ? ?The patient is currently admitted and upon discharge could be taking enoxaparin (Lovenox) 80 mg/0.8 ml injection. ? ?The current 30 day co-pay is, $100.00.  ? ?The patient is currently admitted and upon discharge could be taking Eliquis 5 mg ? ?The current 30 day co-pay is, $47.00.  ? ?The patient is insured through [employer] Part [employer]  ? ? ? ?Roland Earl, CPhT ?Pharmacy Patient Advocate Specialist ?[facility] Patient Advocate Team ?Direct Number: [phone]  Fax: [phone] ? ? ? ? ? ?  ?

## 2022-02-25 NOTE — Evaluation (Signed)
Physical Therapy Evaluation ?Patient Details ?Name: Monica Erickson ?MRN: [id number] ?DOB: January 04, 1946 ?Today's Date: 02/25/2022 ? ?History of Present Illness ? 76 y.o. female admitted 4/15 with sepsis secondary to CAP, acute metabolic encephalopathy, possible PE, anorexia.  Prior medical history significant of dementia, chronic headaches, depression, gastric ulcer, H. pylori infection, nephrolithiasis, sleep apnea not on CPAP.  Recently seen in the ED on 4/5 for dehydration, UTI, and possible H. pylori infection.  ?Clinical Impression ? Pt admitted with/for above problem.  Pt able to mobilize with minimal assist which is not at baseline yet.Marland Kitchen  Pt currently limited functionally due to the problems listed below.  (see problems list.)  Pt will benefit from PT to maximize function and safety to be able to get home safely with available assist. ?   ?   ? ?Recommendations for follow up therapy are one component of a multi-disciplinary discharge planning process, led by the attending physician.  Recommendations may be updated based on patient status, additional functional criteria and insurance authorization. ? ?Follow Up Recommendations Home health PT ? ?  ?Assistance Recommended at Discharge Set up Supervision/Assistance  ?Patient can return home with the following ? A little help with bathing/dressing/bathroom;Assistance with cooking/housework;Direct supervision/assist for medications management;Direct supervision/assist for financial management;Assist for transportation ? ?  ?Equipment Recommendations None recommended by PT  ?Recommendations for Other Services ?    ?  ?Functional Status Assessment Patient has had a recent decline in their functional status and demonstrates the ability to make significant improvements in function in a reasonable and predictable amount of time.  ? ?  ?Precautions / Restrictions Precautions ?Precautions: Fall  ? ?  ? ?Mobility ? Bed Mobility ?  ?  ?  ?  ?  ?  ?  ?General bed mobility comments:  OOB on arrival ?  ? ?Transfers ?Overall transfer level: Needs assistance ?  ?Transfers: Sit to/from Stand ?Sit to Stand: Min assist ?  ?  ?  ?  ?  ?General transfer comment: min more for direction ?  ? ?Ambulation/Gait ?Ambulation/Gait assistance: Min assist ?Gait Distance (Feet): 15 Feet (to bathroom then 60 feet in the hall.) ?Assistive device: 1 person hand held assist ?Gait Pattern/deviations: Step-through pattern ?  ?Gait velocity interpretation: 1.31 - 2.62 ft/sec, indicative of limited community ambulator ?  ?General Gait Details: generally steady with min HHA, flexed posture as if stomach or back pin. ? ?Stairs ?  ?  ?  ?  ?  ? ?Wheelchair Mobility ?  ? ?Modified Rankin (Stroke Patients Only) ?  ? ?  ? ?Balance Overall balance assessment: Mild deficits observed, not formally tested ?  ?  ?  ?  ?  ?  ?  ?  ?  ?  ?  ?  ?  ?  ?  ?  ?  ?  ?   ? ? ? ?Pertinent Vitals/Pain Pain Assessment ?Pain Assessment: Faces ?Faces Pain Scale: Hurts a little bit ?Pain Location: stomach ?Pain Descriptors / Indicators: Grimacing ?Pain Intervention(s): Monitored during session  ? ? ?Home Living Family/patient expects to be discharged to:: Private residence ?Living Arrangements: Children;Other relatives ?Available Help at Discharge: Family;Available 24 hours/day ?Type of Home: House ?Home Access: Stairs to enter ?  ?  ?Alternate Level Stairs-Number of Steps: flight ?Home Layout: Two level ?  ?Additional Comments: TBA  ?  ?Prior Function Prior Level of Function : Needs assist;Other (comment) (pt is mobile by herself in a supervised home environment) ?  ?  ?  ?  ?  ?  ?  Mobility Comments: ambulatory without devices, climbs stair, ?ADLs Comments: toilets herself, likes to be clean. ?  ? ? ?Hand Dominance  ?   ? ?  ?Extremity/Trunk Assessment  ? Upper Extremity Assessment ?Upper Extremity Assessment: Overall WFL for tasks assessed ?  ? ?Lower Extremity Assessment ?Lower Extremity Assessment: Overall WFL for tasks assessed;Generalized  weakness ?  ? ?Cervical / Trunk Assessment ?Cervical / Trunk Assessment: Normal  ?Communication  ? Communication: No difficulties  ?Cognition Arousal/Alertness: Awake/alert ?Behavior During Therapy: [facility] for tasks assessed/performed ?Overall Cognitive Status: History of cognitive impairments - at baseline ?  ?  ?  ?  ?  ?  ?  ?  ?  ?  ?  ?  ?  ?  ?  ?  ?  ?  ?  ? ?  ?General Comments   ? ?  ?Exercises    ? ?Assessment/Plan  ?  ?PT Assessment Patient needs continued PT services  ?PT Problem List Decreased activity tolerance;Decreased mobility;Decreased safety awareness;Decreased balance;Decreased strength ? ?   ?  ?PT Treatment Interventions Gait training;Stair training;Functional mobility training;Therapeutic activities;Balance training;Patient/family education   ? ?PT Goals (Current goals can be found in the Care Plan section)  ?Acute Rehab PT Goals ?Patient Stated Goal: pt's dtr hopes that staff will keep her mobile and at an appropriate level of conditioning while MD's work on UTI, etc. ?PT Goal Formulation: With patient/family ?Time For Goal Achievement: 03/11/22 ?Potential to Achieve Goals: Fair ? ?  ?Frequency Min 3X/week ?  ? ? ?Co-evaluation   ?  ?  ?  ?  ? ? ?  ?AM-PAC PT "6 Clicks" Mobility  ?Outcome Measure   ?Help needed moving from lying on your back to sitting on the side of a flat bed without using bedrails?: None ?Help needed moving to and from a bed to a chair (including a wheelchair)?: A Little ?Help needed standing up from a chair using your arms (e.g., wheelchair or bedside chair)?: A Little ?Help needed to walk in hospital room?: A Little ?Help needed climbing 3-5 steps with a railing? : A Little ?6 Click Score: 16 ? ?  ?End of Session   ?Activity Tolerance: Patient tolerated treatment well (? pain) ?Patient left: in chair;with call bell/phone within reach;with chair alarm set ?Nurse Communication: Mobility status ?PT Visit Diagnosis: Unsteadiness on feet (R26.81) ?  ? ?Time: 5809-9833 ?PT Time  Calculation (min) (ACUTE ONLY): 16 min ? ? ?Charges:   PT Evaluation ?$PT Eval Moderate Complexity: 1 Mod ?  ?  ?   ? ? ?02/25/2022 ? ?Jacinto Halim., PT ?Acute Rehabilitation Services ?[phone]  (pager) ?[phone]  (office) ? ?Eliseo Gum Sam Wunschel ?02/25/2022, 5:51 PM ? ?

## 2022-02-25 NOTE — Progress Notes (Signed)
ANTICOAGULATION CONSULT NOTE - Follow Up Consult ? ?Pharmacy Consult for Lovenox  ?Indication: PE ? ?No Known Allergies ? ?Patient Measurements: ?Height: 5\' 8"  (172.7 cm) ?Weight: 65.1 kg (143 lb 8.3 oz) ?IBW/kg (Calculated) : 63.9 ? ?Vital Signs: ?Temp: 98.2 ?F (36.8 ?C) (04/18 0759) ?Temp Source: Oral (04/18 0759) ?BP: 119/64 (04/18 0759) ?Pulse Rate: 72 (04/18 0759) ? ?Labs: ?Recent Labs  ?  02/22/22 ?2326 02/23/22 ?0010 02/23/22 ?[id number] 02/24/22 ?[id number] 02/24/22 ?[id number] 02/25/22 ?[id number]  ?HGB 13.4  --  10.2* 12.0  --  10.5*  ?HCT 41.8  --  30.2* 35.7*  --  31.8*  ?PLT 228  --  158 210  --  195  ?LABPROT  --  16.4*  --   --   --   --   ?INR  --  1.3*  --   --   --   --   ?CREATININE 1.06*  --  0.73  --  0.67  --   ? ? ? ?Estimated Creatinine Clearance: 62.2 mL/min (by C-G formula based on SCr of 0.67 mg/dL). ? ? ?Assessment: ?76 year old female currently on SQ Lovenox for + PE.  Pt has advanced dementia and is intermittently refusing oral medications and food.  Plan for ongoing anticoagulation with Lovenox 1.5mg /kg q24h for ease of administration by family on discharge.  Please note, Lovenox copay anticipated to be $100 per month. ? ?H/H improved 13.4, Plt wnl, SCr wnl  ? ?Goal of Therapy:  ?Anti-Xa level 0.6-1 units/ml 4hrs after LMWH dose given ?Monitor platelets by anticoagulation protocol: Yes ?  ?Plan:  ?Continue Lovenox 1mg /kg (65mg ) with last dose 4/18 pm ?Change Lovenox to 1.5 mg/kg (100mg ) q 24 hours - first dose 4/19 am ?Monitor CBC, renal fx and watch for s/s of bleeding ? ? ?[employer], Pharm.D., BCPS ?Clinical Pharmacist ? ?**Pharmacist phone directory can be found on [email or web address] listed under [place]. ? ?02/25/2022 11:48 AM ? ? ?

## 2022-02-26 DIAGNOSIS — E876 Hypokalemia: Secondary | ICD-10-CM

## 2022-02-26 DIAGNOSIS — J189 Pneumonia, unspecified organism: Secondary | ICD-10-CM | POA: Diagnosis not present

## 2022-02-26 DIAGNOSIS — R4182 Altered mental status, unspecified: Secondary | ICD-10-CM | POA: Diagnosis not present

## 2022-02-26 DIAGNOSIS — G309 Alzheimer's disease, unspecified: Secondary | ICD-10-CM | POA: Diagnosis not present

## 2022-02-26 DIAGNOSIS — R627 Adult failure to thrive: Secondary | ICD-10-CM

## 2022-02-26 DIAGNOSIS — R630 Anorexia: Secondary | ICD-10-CM

## 2022-02-26 DIAGNOSIS — Z7189 Other specified counseling: Secondary | ICD-10-CM

## 2022-02-26 DIAGNOSIS — F02B3 Dementia in other diseases classified elsewhere, moderate, with mood disturbance: Secondary | ICD-10-CM

## 2022-02-26 DIAGNOSIS — F0390 Unspecified dementia without behavioral disturbance: Secondary | ICD-10-CM | POA: Diagnosis not present

## 2022-02-26 LAB — CBC
HCT: 31.3 % — ABNORMAL LOW (ref 36.0–46.0)
Hemoglobin: 10.9 g/dL — ABNORMAL LOW (ref 12.0–15.0)
MCH: 30.1 pg (ref 26.0–34.0)
MCHC: 34.8 g/dL (ref 30.0–36.0)
MCV: 86.5 fL (ref 80.0–100.0)
Platelets: 232 10*3/uL (ref 150–400)
RBC: 3.62 MIL/uL — ABNORMAL LOW (ref 3.87–5.11)
RDW: 15.2 % (ref 11.5–15.5)
WBC: 6.8 10*3/uL (ref 4.0–10.5)
nRBC: 0 % (ref 0.0–0.2)

## 2022-02-26 LAB — BASIC METABOLIC PANEL
Anion gap: 7 (ref 5–15)
BUN: <5 mg/dL — ABNORMAL LOW (ref 8–23)
CO2: 23 mmol/L (ref 22–32)
Calcium: 8.3 mg/dL — ABNORMAL LOW (ref 8.9–10.3)
Chloride: 107 mmol/L (ref 98–111)
Creatinine, Ser: 0.77 mg/dL (ref 0.44–1.00)
GFR, Estimated: >60 mL/min (ref >60)
Glucose, Bld: 99 mg/dL (ref 70–99)
Potassium: 3.9 mmol/L (ref 3.5–5.1)
Sodium: 137 mmol/L (ref 135–145)

## 2022-02-26 LAB — MAGNESIUM: Magnesium: 1.6 mg/dL — ABNORMAL LOW (ref 1.7–2.4)

## 2022-02-26 MED ORDER — MAGNESIUM SULFATE 2 GM/50ML IV SOLN
2.0000 g | Freq: Once | INTRAVENOUS | Status: AC
  Administered 2022-02-26: 2 g via INTRAVENOUS
  Filled 2022-02-26: qty 50

## 2022-02-26 NOTE — Evaluation (Signed)
Occupational Therapy Evaluation ?Patient Details ?Name: Monica Erickson ?MRN: [id number] ?DOB: 06-May-1946 ?Today's Date: 02/26/2022 ? ? ?History of Present Illness 76 y.o. female admitted 4/15 with sepsis secondary to CAP, acute metabolic encephalopathy, possible PE, anorexia.  Prior medical history significant of dementia, chronic headaches, depression, gastric ulcer, H. pylori infection, nephrolithiasis, sleep apnea not on CPAP.  Recently seen in the ED on 4/5 for dehydration, UTI, and possible H. pylori infection.  ? ?Clinical Impression ?  ?Pt admitted for concerns listed above. PTA pt reported that she was overall independent, however unsure of accuracy due to pt confusion this session. Pt following only approximately 25% of 1 step commands this session. Session was limited due to difficulty following commands, anticipate that pt needs min-mod A to complete all ADL's and functional mobility. Recommending HHOT if pt will have 24/7 assist, otherwise, pt will need SNF therapies to maximize safety and independence.   ?   ? ?Recommendations for follow up therapy are one component of a multi-disciplinary discharge planning process, led by the attending physician.  Recommendations may be updated based on patient status, additional functional criteria and insurance authorization.  ? ?Follow Up Recommendations ? Home health OT  ?  ?Assistance Recommended at Discharge Frequent or constant Supervision/Assistance  ?Patient can return home with the following A lot of help with walking and/or transfers;A lot of help with bathing/dressing/bathroom;Assistance with cooking/housework;Direct supervision/assist for medications management;Direct supervision/assist for financial management;Assist for transportation;Help with stairs or ramp for entrance ? ?  ?Functional Status Assessment ? Patient has had a recent decline in their functional status and/or demonstrates limited ability to make significant improvements in function in a  reasonable and predictable amount of time  ?Equipment Recommendations ? Other (comment) (TBD)  ?  ?Recommendations for Other Services   ? ? ?  ?Precautions / Restrictions Precautions ?Precautions: Fall ?Restrictions ?Weight Bearing Restrictions: No  ? ?  ? ?Mobility Bed Mobility ?Overal bed mobility: Needs Assistance ?Bed Mobility: Supine to Sit, Sit to Supine ?  ?  ?Supine to sit: Min guard ?Sit to supine: Min guard ?  ?General bed mobility comments: No physical assist needed. ?  ? ?Transfers ?Overall transfer level: Needs assistance ?Equipment used: 1 person hand held assist ?Transfers: Sit to/from Stand ?Sit to Stand: Min guard ?  ?  ?  ?  ?  ?General transfer comment: Stood EOB x1 to reposition in bed, unable to follow commands to take steps and mobilize ?  ? ?  ?Balance Overall balance assessment: Needs assistance ?Sitting-balance support: Feet supported, No upper extremity supported ?Sitting balance-Leahy Scale: Fair ?  ?  ?Standing balance support: During functional activity ?Standing balance-Leahy Scale: Fair ?  ?  ?  ?  ?  ?  ?  ?  ?  ?  ?  ?  ?   ? ?ADL either performed or assessed with clinical judgement  ? ?ADL Overall ADL's : Needs assistance/impaired ?  ?  ?  ?  ?  ?  ?  ?  ?  ?  ?  ?  ?  ?  ?  ?  ?  ?  ?  ?General ADL Comments: Unable to fully assess due to pt's difficulty following commands  ? ? ? ?Vision Baseline Vision/History: 1 Wears glasses ?Ability to See in Adequate Light: 0 Adequate ?Patient Visual Report: No change from baseline ?Vision Assessment?: No apparent visual deficits  ?   ?Perception   ?  ?Praxis   ?  ? ?Pertinent Vitals/Pain  Pain Assessment ?Pain Assessment: No/denies pain  ? ? ? ?Hand Dominance Right ?  ?Extremity/Trunk Assessment Upper Extremity Assessment ?Upper Extremity Assessment: Difficult to assess due to impaired cognition ?  ?Lower Extremity Assessment ?Lower Extremity Assessment: Defer to PT evaluation ?  ?Cervical / Trunk Assessment ?Cervical / Trunk Assessment:  Normal ?  ?Communication Communication ?Communication: No difficulties ?  ?Cognition Arousal/Alertness: Awake/alert ?Behavior During Therapy: [facility] for tasks assessed/performed ?Overall Cognitive Status: History of cognitive impairments - at baseline ?  ?  ?  ?  ?  ?  ?  ?  ?  ?  ?  ?  ?  ?  ?  ?  ?General Comments: Emotionally labile during session, crying on and off but not able to express reason. Difficulty with speech. Impaired problem solving and following commands. Pleasant and confused. - pt following approximately 25% of 1 step commands this session ?  ?  ?General Comments  VSS on RA, eval limited due to pt having difficulty following commands ? ?  ?Exercises   ?  ?Shoulder Instructions    ? ? ?Home Living Family/patient expects to be discharged to:: Private residence ?Living Arrangements: Children;Other relatives ?Available Help at Discharge: Family;Available 24 hours/day ?Type of Home: House ?Home Access: Stairs to enter ?  ?  ?Home Layout: Two level ?Alternate Level Stairs-Number of Steps: flight ?Alternate Level Stairs-Rails: Right;Left ?  ?  ?  ?  ?  ?  ?  ?Additional Comments: Info from prior admission, pt unable to answer questions this session due to cognition. ?  ? ?  ?Prior Functioning/Environment Prior Level of Function : Needs assist ?  ?  ?  ?  ?  ?  ?Mobility Comments: ambulatory without devices, climbs stair, ?ADLs Comments: toilets herself, likes to be clean. ?  ? ?  ?  ?OT Problem List: Decreased strength;Decreased activity tolerance;Impaired balance (sitting and/or standing);Decreased cognition;Decreased safety awareness;Decreased knowledge of use of DME or AE ?  ?   ?OT Treatment/Interventions: Self-care/ADL training;Therapeutic exercise;DME and/or AE instruction;Energy conservation;Therapeutic activities;Cognitive remediation/compensation;Patient/family education;Balance training  ?  ?OT Goals(Current goals can be found in the care plan section) Acute Rehab OT Goals ?Patient Stated Goal:  none stated ?OT Goal Formulation: Patient unable to participate in goal setting ?Time For Goal Achievement: 03/12/22 ?Potential to Achieve Goals: Fair ?ADL Goals ?Additional ADL Goal #1: Pt will follow 2 step commands 75% of session with no verbal cuing. ?Additional ADL Goal #2: Pt will sequence 3-4 step ADL tasks, independently. ?Additional ADL Goal #3: Pt will verbalize 3 fall prevention techniques that she can use at home.  ?OT Frequency: Min 2X/week ?  ? ?Co-evaluation   ?  ?  ?  ?  ? ?  ?AM-PAC OT "6 Clicks" Daily Activity     ?Outcome Measure Help from another person eating meals?: A Little ?Help from another person taking care of personal grooming?: A Lot ?Help from another person toileting, which includes using toliet, bedpan, or urinal?: A Lot ?Help from another person bathing (including washing, rinsing, drying)?: A Lot ?Help from another person to put on and taking off regular upper body clothing?: A Little ?Help from another person to put on and taking off regular lower body clothing?: A Lot ?6 Click Score: 14 ?  ?End of Session Equipment Utilized During Treatment: Gait belt;Rolling walker (2 wheels) ?Nurse Communication: Mobility status ? ?Activity Tolerance: Patient tolerated treatment well ?Patient left: in bed;with call bell/phone within reach;with bed alarm set ? ?OT Visit Diagnosis: Unsteadiness  on feet (R26.81);Other abnormalities of gait and mobility (R26.89);Muscle weakness (generalized) (M62.81)  ?              ?Time: [id number] ?OT Time Calculation (min): 15 min ?Charges:  OT General Charges ?$OT Visit: 1 Visit ?OT Evaluation ?$OT Eval Moderate Complexity: 1 Mod ? ?Jayion Schneck H., OTR/L ?Acute Rehabilitation ? ?Monica Erickson ?02/26/2022, 7:05 PM ?

## 2022-02-26 NOTE — Care Management Important Message (Signed)
Important Message ? ?Patient Details  ?Name: Monica Erickson ?MRN: [id number] ?Date of Birth: 15-Oct-1946 ? ? ?Medicare Important Message Given:  Yes ? ? ? ? ?Mayu Ronk ?02/26/2022, 3:11 PM ?

## 2022-02-26 NOTE — Consult Note (Signed)
? ?                                                                                ?Consultation Note ?Date: 02/26/2022  ? ?Patient Name: Monica Erickson  ?DOB: August 15, 1946  MRN: [id number]  Age / Sex: 76 y.o., female  ?PCP: Vevelyn Francois, NP ?Referring Physician: Kerney Elbe, DO ? ?Reason for Consultation:  goals of care- "76 yo w/ history of dementia admit with PNA and subacute PE. Refusing oral meds and IV meds, also not eating. Please assist with GOC/MDM" ? ?HPI/Patient Profile: 76 y.o. female  with past medical history of dementia, depression, kidney stones, sleep apnea (does not use CPAP), chronic headaches, H. Pylori ulcer (untreated due to not able to swallow treatment, however, started on liquid antibiotics on 4/4 by ED visit to treat H. Pylori and UTI), diverticulosis,  admitted on 02/22/2022 with increasing confusion, R flank pain. Workup revealed sepsis, pneumonia, acute kidney injury. She is declining to eat this admission and declining oral medications. Palliative medicine consulted for [place].  ?  ? ?Primary Decision Maker ?NEXT OF KIN- daughter [name] and sister ? ?Discussion: ?Chart reviewed including labs, imaging, progress notes from this hospitalization, previous hospitalization and outpatient visits.  ?Per chart review she has had ongoing issues with not taking oral medications that are not in liquid form and she has had ongoing decreased po intake thought to be related to H. pylori. She has had some weight loss- with her weight being 70.6 kg in January,  and  65.1 kg on admission (not quite significant as it is less than 10% in 6 months, however, concerning as it is likely to continue due to her poor oral intake). Her albumin was 3.5 on admission but has decreased to 2.2.  ?Per PT note she is able to ambulate, needs a little help with ADL's, but not total care. ?Evaluated patient. She was awake and alert, but not able to answer orientation questions. She became tearful when asked if she was in  pain, but was easily reassured and refocused on a movie she was interested in. Speech is intelligible, but her answers to questions are not relevant to the question being asked. She is unable to participate in [place] discussion.  ?I called her daughter [name]. Introduced Palliative Medicine- Palliative medicine is specialized medical care for people living with serious illness. It focuses on providing relief from the symptoms and stress of a serious illness. The goal is to improve quality of life for both the patient and the family. ?Discussed the role of Palliative consult and necessity of advanced care planning in chronic illness. ?[name] inquired if this included discussion of PEG- I noted to her that often it can- but I see in chart that she has stated before that her mother would not want artificial feeding- [name] agrees with this. I reviewed other aspects of advanced care planning.  ?There are other family members who [name] believes should be involved in our discussion. [name]'s sister is primary caretaker in the home.  ?  ? 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    ?-Plan  to meet with family tomorrow 4/20 at 12p ? ?Code Status/Advance Care Planning: ?Full code ? ? ?Prognosis:   ?Unable to determine ? ?Discharge Planning: To Be Determined ? ?Primary Diagnoses: ?Present on Admission: ? AMS (altered mental status) ? Dementia without behavioral disturbance ([place]) ? Poor appetite ? 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 Pulmonary embolism ([place]) ? Failure to thrive in adult ? Sepsis due to pneumonia [facility]) ? Hypokalemia ? ? ?Review of Systems ? ?Physical Exam ? ?Vital Signs: BP 129/78 (BP Location: Right Arm)   Pulse 78   Temp 98.3 ?F (36.8 ?C) (Oral)   Resp 18   Ht 5\' 8"  (1.727 m)   Wt 65.1 kg   SpO2 100%   BMI 21.82 kg/m?  ?Pain Scale: PAINAD ?  ?Pain Score: Asleep ? ? ?SpO2: SpO2:  (100) ?O2 Device:SpO2:  (100) ?O2 Flow Rate: .  ? ?IO: Intake/output  summary:  ?Intake/Output Summary (Last 24 hours) at 02/26/2022 1346 ?Last data filed at 02/26/2022 1019 ?Gross per 24 hour  ?Intake 826.35 ml  ?Output --  ?Net 826.35 ml  ? ? ?LBM: Last BM Date :  (PTA) ?Baseline Weight: Weight: 65.1 kg ?Most recent weight: Weight: 65.1 kg     ?P ? ?Thank you for this consult. Palliative medicine will continue to follow and assist as needed.  ? ?Signed by: ?Mariana Kaufman, AGNP-C ?Palliative Medicine ? ?  ?Please contact Palliative Medicine Team phone at [phone] for questions and concerns.  ?For individual provider: See [place] ? ? ? ? ? ? ? ? ? ? ? ? ? ? ?

## 2022-02-26 NOTE — Hospital Course (Addendum)
The patient Monica Erickson is a 76 y.o. female with medical history significant of dementia, chronic headaches, depression, gastric ulcer, H. pylori infection, nephrolithiasis, sleep apnea not on CPAP who presented to [facility] ED on 4/15 for evaluation of confusion, somnolence, and flank pain.  Patient is oriented to self only and not able to give any history. ?  ?Recently seen in the ED on 4/5 for dehydration, UTI, and possible H. pylori infection.  GI recommended outpatient follow-up. ?  ?In the ED, patient was found to be slightly tachycardic on arrival with low blood pressure in the 90s. Not febrile.  WBC 10.1.  BUN 15, creatinine 1.0.  T. bili 1.6, remainder of LFTs normal.  Lactic acid 2.5.  INR 1.3.  Blood cultures drawn.  UA pending.  Chest x-[place]by showing patchy airspace disease within the right mid and lower lung zones consistent with bronchopneumonia.  CT abdomen pelvis with contrast negative for renal calculus or obstructive uropathy.  Showing trace right pleural effusion with patchy airspace disease at the right lung base concerning for pneumonia.  Also showing prominent right hilum with a hypodense region in the right pulmonary artery only partially visualized on exam, possible PE versus prominent lymph nodes.  Dedicated CT angiogram chest recommended for further evaluation.  Patient was given fentanyl, IV heparin, ceftriaxone, azithromycin, and 2 L LR boluses.  Repeat lactate 1.8.  Hospital service was consulted for further evaluation and management of altered mental status/confusion with concern for pneumonia versus PE.  ? ?**Interim History ?She continues to have extremely poor p.o. intake has been refusing her medications orally.  Palliative Care has been consulted for goals of care discussion and patient had a family conference yesterday with palliative care and was a complicated situation given that the patient is still not eating or drinking.  She does have a history of a gastric ulcer and has a history  of H. pylori and palliative was concerned that there may be an element of stomach pain so they have initiated IV Protonix and given ondansetron and recommended touching base with GI.  She has been consulted for further evaluation to see if we could possibly help her p.o. intake and if she would benefit from treatment.  We will continue with palliative goals of care conversation but patient still not eating and she is very pleasantly demented still.  Patient's family believes that she may improve her oral intake when she is at home. ? ?She underwent Upper GI Series and it showed "Scout radiograph demonstrates non-obstructive bowel gas pattern and elevated right hemidiaphragm. A single sip of contrast was swallowed via teaspoon and only a minimally distended distal esophagus was captured with imaging. Patient declined to continue." ? ?Patient still not eating anything and continues to be significantly confused and has been intermittently hallucinating. Palliative to follow up on GOC conversations but unfortunately could not get in touch with the daughter yesterday.  ?

## 2022-02-26 NOTE — Progress Notes (Signed)
?PROGRESS NOTE ? ? ? ?Monica Erickson  [id number] DOB: 03-Nov-1946 DOA: 02/22/2022 ?PCP: Vevelyn Francois, NP  ? ?Brief Narrative:  ?The patient Monica Erickson is a 76 y.o. female with medical history significant of dementia, chronic headaches, depression, gastric ulcer, H. pylori infection, nephrolithiasis, sleep apnea not on CPAP who presented to [facility] ED on 4/15 for evaluation of confusion, somnolence, and flank pain.  Patient is oriented to self only and not able to give any history. ?  ?Recently seen in the ED on 4/5 for dehydration, UTI, and possible H. pylori infection.  GI recommended outpatient follow-up. ?  ?In the ED, patient was found to be slightly tachycardic on arrival with low blood pressure in the 90s. Not febrile.  WBC 10.1.  BUN 15, creatinine 1.0.  T. bili 1.6, remainder of LFTs normal.  Lactic acid 2.5.  INR 1.3.  Blood cultures drawn.  UA pending.  Chest x-Hoey showing patchy airspace disease within the right mid and lower lung zones consistent with bronchopneumonia.  CT abdomen pelvis with contrast negative for renal calculus or obstructive uropathy.  Showing trace right pleural effusion with patchy airspace disease at the right lung base concerning for pneumonia.  Also showing prominent right hilum with a hypodense region in the right pulmonary artery only partially visualized on exam, possible PE versus prominent lymph nodes.  Dedicated CT angiogram chest recommended for further evaluation.  Patient was given fentanyl, IV heparin, ceftriaxone, azithromycin, and 2 L LR boluses.  Repeat lactate 1.8.  Hospital service was consulted for further evaluation and management of altered mental status/confusion with concern for pneumonia versus PE.  ? ?**Interim History ?She continues to have extremely poor p.o. intake has been refusing her medications orally.  Palliative Care has been consulted for goals of care discussion.  ? ?Assessment and Plan: ? ?Sepsis, POA ?Right-sided bronchopneumonia ?-Patient  presenting with elevated WBC count of 10.2, chest x-Lupercio with evidence of right-sided pneumonia.   ?-Strep pneumo urinary antigen: Negative.   ?-Given patient's presenting symptoms of confusion greater than her typical baseline we will continue treatment for pneumonia x5 days. ?-Continuing Azithromycin 5 mg IV q24h x 5 days and Ceftriaxone 2 g IV q24h x 5 days ?  ?Bilateral pulmonary embolism with pulmonary infarct ?-CT angiogram chest with extensive chronic appearing pulmonary emboli within the distal right pulmonary artery extending to the right middle lobe and right lower lobar and segmental pulmonary arteries, segmental/subsegmental pulm emboli noted within the left lower lobe which are age-indeterminate, CT evidence of right heart strain.   ?-Patient was initially started on heparin drip and now transition to Lovenox.  TTE with preserved LVEF, RV function within normal limits. ?-D-Dimer was 3.28  ?-Transitioned Lovenox to 1.5 mg/kg once daily dosing for ease of administration by family ?-TOC for benefit check for Lovenox; patient unlikely to comply with oral medications due to diversion and refusing oral intake ?  ?Acute metabolic encephalopathy ?Hx dementia ?-Patient presenting from home with confusion in the setting of history of advanced dementia.  Patient appears to be at baseline, but concern for worsening dementia/progression given her poor oral intake.  Treating pneumonia/PE as above.  Vitamin B12 210, TSH 1.010, urinalysis unrevealing. ?-C/w Delirium precautions ?--Get up during the day ?-Encourage a familiar face to remain present throughout the day ?-Keep blinds open and lights on during daylight hours ?-Minimize the use of opioids/benzodiazepines ?  ?Hypokalemia ?-Repleted. ?-K+ went from 3.2 -> 3.9 ?-Continue to Monitor and Replete as Necessary  ?-Repeat CMP in  the AM  ?  ?Acute renal failure ?-Creatinine 1.06 on admission, supported with IV fluid hydration with improvement of creatinine to  0.67. ?-Now BUN/Cr is <5/0.77 ?-C/w D5 1/2 NS + 40 mEQ Kcl at 75 mL/hr ?-Avoid nephrotoxic medications, contrast dyes, hypotension and dehydration renally dose medications ?-Repeat BMP/CMP in the a.m. ?  ?Hyperbilirubinemia ?-Resolved. T Bili went from 1.6 -> 0.8 ?-Continue to Monitor and Trend ?-Repeat CMP in the AM  ? ?Hypomagnesemia ?-Patient's mag level was 1.6 this morning ?-Replete with IV mag sulfate 2 g ?-Continue monitor and replete as necessary ?-Repeat mag level in a.m. ? ?Normocytic Anemia ?-Likely Dilutional Drop from IVF as above  ?-Patient's hemoglobin/hematocrit went from 13.4/41.8 is now 10.2/30.2 ?-Check anemia panel in the a.m. ?-Continue monitor for signs and symptoms bleeding; no overt bleeding noted  ?-Repeat CBC in the ?  ?Adult failure to Thrive ?Hypoalbuminemia ?-Etiology likely secondary to progressive dementia.   ?-Albumin Level was 2.2  ?-Family reports poor oral intake at baseline.  Has declined consideration for G-tube in the past.  Dr. [place] has discussed with patient's daughter, Monica Erickson regarding the progressive nature of dementia as she is likely nearing end-stage. ?-Palliative care consulted for assistance with goals of care/medical decision making and family conference tomorrow on 02/27/22 at 12:00 pm  ? ?DVT Prophylaxis:   Anticoagulated with Enoxaparin 100 mg subcu every 24 hours ? ?  Code Status: Full Code ?Family Communication: No family present at bedside  ? ?Disposition Plan:  ?Level of care: Telemetry Medical ?Status is: Inpatient ?Remains inpatient appropriate because: Still not having very good oral intake and awaiting Palliative Care ?  ?Consultants:  ?Palliative Care Medicine  ? ?Procedures:  ?None ? ?Antimicrobials:  ?Anti-infectives (From admission, onward)  ? ? Start     Dose/Rate Route Frequency Ordered Stop  ? 02/22/22 2315  cefTRIAXone (ROCEPHIN) 2 g in sodium chloride 0.9 % 100 mL IVPB       ? 2 g ?200 mL/hr over 30 Minutes Intravenous Every 24 hours 02/22/22 2314  02/27/22 2314  ? 02/22/22 2315  azithromycin (ZITHROMAX) 500 mg in sodium chloride 0.9 % 250 mL IVPB       ? 500 mg ?250 mL/hr over 60 Minutes Intravenous Every 24 hours 02/22/22 2314 02/27/22 2314  ? ?  ?  ?Subjective: ?Seen and examined at bedside remains pleasantly demented and remains confused.  Still not taking in very good p.o. intake and refused all her morning medications.  Denies any complaints or pain at this time.  Did not eat any of her breakfast this morning.  No other concerns or complaints at this time and family conference to be done tomorrow with palliative at 75 PM. ? ?Objective: ?Vitals:  ? 02/25/22 1951 02/26/22 0002 02/26/22 0409 02/26/22 0900  ?BP: 117/76 129/77 93/78 129/78  ?Pulse: 90 89 76 78  ?Resp: 19 15 14 18   ?Temp: 98.4 ?F (36.9 ?C) 98.1 ?F (36.7 ?C) 98.1 ?F (36.7 ?C) 98.3 ?F (36.8 ?C)  ?TempSrc: Axillary Oral Oral Oral  ?SpO2: 100% 99% 100%   ?Weight:      ?Height:      ? ? ?Intake/Output Summary (Last 24 hours) at 02/26/2022 1524 ?Last data filed at 02/26/2022 1019 ?Gross per 24 hour  ?Intake 350 ml  ?Output --  ?Net 350 ml  ? ?Filed Weights  ? 02/23/22 0035  ?Weight: 65.1 kg  ? ?Examination: ?Physical Exam: ? ?Constitutional: Elderly African-American female who is pleasantly confused and demented in no acute distress ?  Respiratory: Diminished to auscultation bilaterally with coarse breath sounds, no wheezing, rales, rhonchi or crackles. Normal respiratory effort and patient is not tachypenic. No accessory muscle use.  Unlabored breathing and not wearing any supplemental oxygen via nasal cannula ?Cardiovascular: RRR, no murmurs / rubs / gallops. S1 and S2 auscultated. No extremity edema. ?Abdomen: Soft, non-tender, non-distended. Bowel sounds positive.  ?GU: Deferred. ?Musculoskeletal: No clubbing / cyanosis of digits/nails. No joint deformity upper and lower extremities.  ?Neurologic: CN 2-12 grossly intact with no focal deficits. Romberg sign cerebellar reflexes not assessed.   ?Psychiatric: Impaired judgment and insight.  She is awake and has a normal mood and affect ? ?Data Reviewed: I have personally reviewed following labs and imaging studies ? ?CBC: ?Recent Labs  ?Lab 02/22/22 ?2326 02/23/22 ?[id number]

## 2022-02-26 NOTE — Progress Notes (Signed)
Physical Therapy Treatment ?Patient Details ?Name: Monica Erickson ?MRN: [id number] ?DOB: 1946-07-30 ?Today's Date: 02/26/2022 ? ? ?History of Present Illness 76 y.o. female admitted 4/15 with sepsis secondary to CAP, acute metabolic encephalopathy, possible PE, anorexia.  Prior medical history significant of dementia, chronic headaches, depression, gastric ulcer, H. pylori infection, nephrolithiasis, sleep apnea not on CPAP.  Recently seen in the ED on 4/5 for dehydration, UTI, and possible H. pylori infection. ? ?  ?PT Comments  ? ? Patient progressing slowly towards PT goals. Requires mod A for bed mobility and min A for transfers today. Increased time to perform all movement with max multimodal cues. Pt easily distracted during session and emotionally labile, crying at times. Tolerated short distance ambulation in room with Min A and some ADLs standing at sink. Pleasant and cooperative. Will continue to follow and progress as tolerated to improve strength so pt can safely return home with family. ?  ?Recommendations for follow up therapy are one component of a multi-disciplinary discharge planning process, led by the attending physician.  Recommendations may be updated based on patient status, additional functional criteria and insurance authorization. ? ?Follow Up Recommendations ? Home health PT ?  ?  ?Assistance Recommended at Discharge Set up Supervision/Assistance  ?Patient can return home with the following A little help with bathing/dressing/bathroom;Assistance with cooking/housework;Direct supervision/assist for medications management;Direct supervision/assist for financial management;Assist for transportation;A little help with walking and/or transfers ?  ?Equipment Recommendations ? None recommended by PT  ?  ?Recommendations for Other Services   ? ? ?  ?Precautions / Restrictions Precautions ?Precautions: Fall ?Restrictions ?Weight Bearing Restrictions: No  ?  ? ?Mobility ? Bed Mobility ?Overal bed mobility:  Needs Assistance ?Bed Mobility: Supine to Sit, Sit to Supine ?  ?  ?Supine to sit: Mod assist, HOB elevated ?Sit to supine: Mod assist, HOB elevated ?  ?General bed mobility comments: Assist with LEs and trunk to get to EOB, assist to bring LEs into bed. Increased time and cues, difficulty with sequencing ?  ? ?Transfers ?Overall transfer level: Needs assistance ?Equipment used: 1 person hand held assist ?Transfers: Sit to/from Stand ?Sit to Stand: Min assist ?  ?  ?  ?  ?  ?General transfer comment: Min A to power to standing from EOB x1. ?  ? ?Ambulation/Gait ?Ambulation/Gait assistance: Min assist ?Gait Distance (Feet): 30 Feet ?Assistive device: 1 person hand held assist ?Gait Pattern/deviations: Step-through pattern, Decreased stride length, Trunk flexed ?Gait velocity: decreased ?Gait velocity interpretation: <1.31 ft/sec, indicative of household ambulator ?  ?General Gait Details: Slow, mildly unsteady gait with HHA, flexed posture. MIn A for balance throughout. Difficulty redirecting patient in room to perform functional tasks. ? ? ?Stairs ?  ?  ?  ?  ?  ? ? ?Wheelchair Mobility ?  ? ?Modified Rankin (Stroke Patients Only) ?  ? ? ?  ?Balance Overall balance assessment: Needs assistance ?Sitting-balance support: Feet supported, No upper extremity supported ?Sitting balance-Leahy Scale: Fair ?  ?  ?Standing balance support: During functional activity ?Standing balance-Leahy Scale: Fair ?Standing balance comment: Able to stand at sink and wash hands with close min guard. Assist to doff underwear in bathroom. ?  ?  ?  ?  ?  ?  ?  ?  ?  ?  ?  ?  ? ?  ?Cognition Arousal/Alertness: Awake/alert ?Behavior During Therapy: [facility] for tasks assessed/performed ?Overall Cognitive Status: History of cognitive impairments - at baseline ?  ?  ?  ?  ?  ?  ?  ?  ?  ?  ?  ?  ?  ?  ?  ?  ?  General Comments: Emotionally labile during session, crying on and off but not able to express reason. Difficulty with speech. Impaired problem  solving and following commands. Pleasant and confused. ?  ?  ? ?  ?Exercises   ? ?  ?General Comments General comments (skin integrity, edema, etc.): HR up to 128 bpm with activity. ?  ?  ? ?Pertinent Vitals/Pain Pain Assessment ?Pain Assessment: Faces ?Faces Pain Scale: Hurts a little bit ?Pain Location: RUE pointing at IV site ?Pain Descriptors / Indicators: Crying, Guarding ?Pain Intervention(s): Monitored during session  ? ? ?Home Living   ?  ?  ?  ?  ?  ?  ?  ?  ?  ?   ?  ?Prior Function    ?  ?  ?   ? ?PT Goals (current goals can now be found in the care plan section) Progress towards PT goals: Progressing toward goals (slowly) ? ?  ?Frequency ? ? ? Min 3X/week ? ? ? ?  ?PT Plan Current plan remains appropriate  ? ? ?Co-evaluation   ?  ?  ?  ?  ? ?  ?AM-PAC PT "6 Clicks" Mobility   ?Outcome Measure ? Help needed turning from your back to your side while in a flat bed without using bedrails?: A Little ?Help needed moving from lying on your back to sitting on the side of a flat bed without using bedrails?: A Lot ?Help needed moving to and from a bed to a chair (including a wheelchair)?: A Little ?Help needed standing up from a chair using your arms (e.g., wheelchair or bedside chair)?: A Little ?Help needed to walk in hospital room?: A Little ?Help needed climbing 3-5 steps with a railing? : A Little ?6 Click Score: 17 ? ?  ?End of Session Equipment Utilized During Treatment: Gait belt ?Activity Tolerance: Patient tolerated treatment well;Patient limited by pain ?Patient left: in bed;with call bell/phone within reach;with bed alarm set ?Nurse Communication: Mobility status;Other (comment) (purewick) ?PT Visit Diagnosis: Unsteadiness on feet (R26.81) ?  ? ? ?Time: [id number] ?PT Time Calculation (min) (ACUTE ONLY): 22 min ? ?Charges:  $Therapeutic Activity: 8-22 mins          ?          ? ?Marisa Severin, PT, DPT ?Acute Rehabilitation Services ?Secure chat preferred ?Office [phone] ? ? ? ? ? ?[place] ?02/26/2022, 12:16 PM ? ?

## 2022-02-27 ENCOUNTER — Inpatient Hospital Stay (HOSPITAL_COMMUNITY): Payer: Medicare Other

## 2022-02-27 DIAGNOSIS — R4182 Altered mental status, unspecified: Secondary | ICD-10-CM | POA: Diagnosis not present

## 2022-02-27 DIAGNOSIS — E876 Hypokalemia: Secondary | ICD-10-CM | POA: Diagnosis not present

## 2022-02-27 DIAGNOSIS — R627 Adult failure to thrive: Secondary | ICD-10-CM | POA: Diagnosis not present

## 2022-02-27 DIAGNOSIS — A419 Sepsis, unspecified organism: Secondary | ICD-10-CM

## 2022-02-27 DIAGNOSIS — F0390 Unspecified dementia without behavioral disturbance: Secondary | ICD-10-CM | POA: Diagnosis not present

## 2022-02-27 LAB — CBC WITH DIFFERENTIAL/PLATELET
Abs Immature Granulocytes: 0.03 10*3/uL (ref 0.00–0.07)
Basophils Absolute: 0 10*3/uL (ref 0.0–0.1)
Basophils Relative: 1 %
Eosinophils Absolute: 0.2 10*3/uL (ref 0.0–0.5)
Eosinophils Relative: 3 %
HCT: 34 % — ABNORMAL LOW (ref 36.0–46.0)
Hemoglobin: 11.7 g/dL — ABNORMAL LOW (ref 12.0–15.0)
Immature Granulocytes: 1 %
Lymphocytes Relative: 39 %
Lymphs Abs: 2.3 10*3/uL (ref 0.7–4.0)
MCH: 30.4 pg (ref 26.0–34.0)
MCHC: 34.4 g/dL (ref 30.0–36.0)
MCV: 88.3 fL (ref 80.0–100.0)
Monocytes Absolute: 0.4 10*3/uL (ref 0.1–1.0)
Monocytes Relative: 7 %
Neutro Abs: 3 10*3/uL (ref 1.7–7.7)
Neutrophils Relative %: 49 %
Platelets: 307 10*3/uL (ref 150–400)
RBC: 3.85 MIL/uL — ABNORMAL LOW (ref 3.87–5.11)
RDW: 15.3 % (ref 11.5–15.5)
WBC: 5.9 10*3/uL (ref 4.0–10.5)
nRBC: 0 % (ref 0.0–0.2)

## 2022-02-27 LAB — MAGNESIUM: Magnesium: 2 mg/dL (ref 1.7–2.4)

## 2022-02-27 LAB — CULTURE, BLOOD (ROUTINE X 2): Culture: NO GROWTH

## 2022-02-27 LAB — COMPREHENSIVE METABOLIC PANEL
ALT: 11 U/L (ref 0–44)
AST: 18 U/L (ref 15–41)
Albumin: 2.7 g/dL — ABNORMAL LOW (ref 3.5–5.0)
Alkaline Phosphatase: 78 U/L (ref 38–126)
Anion gap: 10 (ref 5–15)
BUN: <5 mg/dL — ABNORMAL LOW (ref 8–23)
CO2: 17 mmol/L — ABNORMAL LOW (ref 22–32)
Calcium: 8.4 mg/dL — ABNORMAL LOW (ref 8.9–10.3)
Chloride: 106 mmol/L (ref 98–111)
Creatinine, Ser: 0.83 mg/dL (ref 0.44–1.00)
GFR, Estimated: >60 mL/min (ref >60)
Glucose, Bld: 103 mg/dL — ABNORMAL HIGH (ref 70–99)
Potassium: 4 mmol/L (ref 3.5–5.1)
Sodium: 133 mmol/L — ABNORMAL LOW (ref 135–145)
Total Bilirubin: 0.5 mg/dL (ref 0.3–1.2)
Total Protein: 6 g/dL — ABNORMAL LOW (ref 6.5–8.1)

## 2022-02-27 LAB — PHOSPHORUS: Phosphorus: 2 mg/dL — ABNORMAL LOW (ref 2.5–4.6)

## 2022-02-27 MED ORDER — ALUM & MAG HYDROXIDE-SIMETH 200-200-20 MG/5ML PO SUSP
30.0000 mL | Freq: Once | ORAL | Status: DC
  Filled 2022-02-27: qty 30

## 2022-02-27 MED ORDER — PANTOPRAZOLE SODIUM 40 MG IV SOLR
40.0000 mg | INTRAVENOUS | Status: DC
  Administered 2022-02-27 – 2022-02-28 (×2): 40 mg via INTRAVENOUS
  Filled 2022-02-27 (×2): qty 10

## 2022-02-27 MED ORDER — SODIUM BICARBONATE 8.4 % IV SOLN
INTRAVENOUS | Status: AC
  Filled 2022-02-27: qty 1000
  Filled 2022-02-27: qty 150

## 2022-02-27 MED ORDER — PANTOPRAZOLE SODIUM 40 MG IV SOLR: 40.0000 mg | Freq: Two times a day (BID) | INTRAVENOUS | Status: DC

## 2022-02-27 MED ORDER — ONDANSETRON HCL 4 MG/2ML IJ SOLN
4.0000 mg | Freq: Once | INTRAMUSCULAR | Status: AC
  Administered 2022-02-27: 4 mg via INTRAVENOUS
  Filled 2022-02-27: qty 2

## 2022-02-27 MED ORDER — DEXTROSE 5 % IV SOLN
30.0000 mmol | Freq: Once | INTRAVENOUS | Status: AC
  Administered 2022-02-27: 30 mmol via INTRAVENOUS
  Filled 2022-02-27: qty 10

## 2022-02-27 NOTE — Progress Notes (Signed)
? ?                                                                                                                                                     ?                                                   ?Daily Progress Note  ? ?Patient Name: Monica Erickson       Date: 02/27/2022 ?DOB: 1946/10/09  Age: 76 y.o. MRN#: [id number] ?Attending Physician: Kerney Elbe, DO ?Primary Care Physician: Vevelyn Francois, NP ?Admit Date: 02/22/2022 ? ?Reason for Consultation/Follow-up: Establishing goals of care ? ?Patient Profile/HPI:   76 y.o. female  with past medical history of dementia, depression, kidney stones, sleep apnea (does not use CPAP), chronic headaches, H. Pylori ulcer (untreated due to not able to swallow treatment, however, started on liquid antibiotics on 4/4 by ED visit to treat H. Pylori and UTI), diverticulosis,  admitted on 02/22/2022 with increasing confusion, R flank pain. Workup revealed sepsis, pneumonia, acute kidney injury. She also has a chronic PE and has been started on lovenox. She is declining to eat this admission and declining oral medications. Palliative medicine consulted for [place].  ? ?Subjective: ?Evaluated Monica Erickson- continues not to eat or drink and decline oral medications.  ?She acknowledges stomach pain and grimaces with abdominal palpation.  ?Met with family members- Monica Erickson (daughter), Monica Erickson (daughter, primary caregiver), Monica Erickson (niece), and Monica Erickson (Equities trader). Her sister, Monica Erickson and brother Monica Erickson were present briefly by speakerphone.  ?[name] is originally from [place]. She worked as a Building control surveyor. She lives in home with her daughters. She was quite functional prior to admission- able to dress herself, feed herself (although she was eating small amounts- mostly sweets). Ambulated in the house and up and down the stairs. She is continent of stool and urine, able to toilet herself.  ?She values spending time with her family, she values taking care of her looks- having her hair, makeup  and nails done.  ?We discussed her current illness, and chronic illnesses. The natural trajectory of dementia was reviewed and the fact that it is a progressive irreversible illness. We reviewed her other health issues and current acute issues. We also discussed how acute illnesses can affect dementia causing it worsen.  ?Family shares they have good support at home with multiple family caregivers providing 24 hour support. Goals of care are to take patient home, hoping for home PT/OT for acute deconditioning.  ?They would not choose to place a feeding tube if Monica Erickson continues not to eat and drink. It is possible she will start to eat and drink more once she is home in a familiar  setting with familiar caregivers.  ?Attempted to further discuss larger goals of care and advanced care planning, presented a MOST form. However, family declined to discuss. Monica Erickson acknowledged the importance of that discussion- but stated she wished to discuss it at home among the family.  ?Family would like to continue full scope care and full code status- limit of no feeding tube.  ? ?Review of Systems  ?Unable to perform ROS: Dementia  ? ? ?Physical Exam ?Vitals and nursing note reviewed.  ?Cardiovascular:  ?   Rate and Rhythm: Normal rate.  ?Abdominal:  ?   Tenderness: There is abdominal tenderness.  ?Neurological:  ?   Mental Status: She is alert. She is disoriented.  ?         ? ?Vital Signs: BP (!) 137/96 (BP Location: Right Arm)   Pulse 69   Temp 99 ?F (37.2 ?C) (Axillary)   Resp 20   Ht $R'5\' 8"'pU$  (1.727 m)   Wt 65.1 kg   SpO2 100%   BMI 21.82 kg/m?  ?SpO2: SpO2: 100 % ?O2 Device: O2 Device: Room Air ?O2 Flow Rate:   ? ?Intake/output summary:  ?Intake/Output Summary (Last 24 hours) at 02/27/2022 1309 ?Last data filed at 02/27/2022 6073 ?Gross per 24 hour  ?Intake 1205.48 ml  ?Output 1250 ml  ?Net -44.52 ml  ? ?LBM: Last BM Date :  (PTA) ?Baseline Weight: Weight: 65.1 kg ?Most recent weight: Weight: 65.1 kg ? ?     ?Palliative  Assessment/Data: PPS: 10% ? ? ? ? ? ?Patient Active Problem List  ? Diagnosis Date Noted  ? Pulmonary embolism ([place]) 02/25/2022  ? Failure to thrive in adult 02/25/2022  ? Sepsis due to pneumonia ([place]) 02/25/2022  ? Hypokalemia 02/25/2022  ? AMS (altered mental status) 02/23/2022  ? Pseudophakia of both eyes 02/26/2021  ? Alzheimer's dementia ([place]) 09/24/2020  ? Generalized abdominal pain 02/17/2020  ? Poor appetite 02/17/2020  ? Weight loss 02/17/2020  ? Constipation 02/17/2020  ? Other hyperlipidemia 02/17/2020  ? Belching 02/17/2020  ? Stone, kidney 12/07/2018  ? Dementia without behavioral disturbance ([place]) 12/07/2018  ? ? ?Palliative Care Assessment & Plan  ? ? ?Assessment/Recommendations/Plan ? ?Complicated patient in that she is not eating or drinking- given her history of ulcer and positive H. Pylori I am concerned there may be an element of stomach pain and symptoms that she can't communicate contributing to her decreased oral intake vs progression of her dementia- I attempted to have her drink Maalox and she refused- I also attempted to get her to eat ice cream and she refused and said, "I can't" and grimaced and held her stomach-  will trial protonix $RemoveBefore'40mg'crMfmxrBzcGST$  IV daily and give one dose ondansetron $RemoveBeforeDE'4mg'CsdZmhPAWvewrgh$  IV to see if this could possibly help improve her po intake- ?if she would benefit from a GI consult  ?She is not currently eligible for hospice based on her dementia alone- however, if she continues not to eat and drink and has further decline in her functional status then she will likely become eligible very quickly- family is agreeable to outpatient Palliative visit at home ?GOC are to stabilize patient to point where she can be discharged home- family is aware this is complicated by her lack of oral intake, but also believe she will improve her oral intake once she is home- I do not disagree with this- ultimately the best thing for her would likely be to get her home as soon as possible- she has very good  supportive  care in the home- family is interested in PT/OT or other home health services if they are available ?PMT will continue to follow and engage family in [place] - will re-evaluate patient on Sunday ? ? ?Code Status: ?Full code ? ?Prognosis: ? Unable to determine ? ?Discharge Planning: ?Home with Palliative Services ? ?Care plan was discussed with patient's multiple family members ? ?Thank you for allowing the Palliative Medicine Team to assist in the care of this patient. ? ?Total time: 130 minutes ? ?Mariana Kaufman, AGNP-C ?Palliative Medicine ? ? ?Please contact Palliative Medicine Team phone at [phone] for questions and concerns.  ? ? ? ? ? ? ?

## 2022-02-27 NOTE — Progress Notes (Signed)
?PROGRESS NOTE ? ? ? ?Monica Erickson  WC:[id number] DOB: Mar 24, 1946 DOA: 02/22/2022 ?PCP: Vevelyn Francois, NP  ? ?Brief Narrative:  ?The patient Monica Erickson is a 76 y.o. female with medical history significant of dementia, chronic headaches, depression, gastric ulcer, H. pylori infection, nephrolithiasis, sleep apnea not on CPAP who presented to [facility] ED on 4/15 for evaluation of confusion, somnolence, and flank pain.  Patient is oriented to self only and not able to give any history. ?  ?Recently seen in the ED on 4/5 for dehydration, UTI, and possible H. pylori infection.  GI recommended outpatient follow-up. ?  ?In the ED, patient was found to be slightly tachycardic on arrival with low blood pressure in the 90s. Not febrile.  WBC 10.1.  BUN 15, creatinine 1.0.  T. bili 1.6, remainder of LFTs normal.  Lactic acid 2.5.  INR 1.3.  Blood cultures drawn.  UA pending.  Chest x-[place] showing patchy airspace disease within the right mid and lower lung zones consistent with bronchopneumonia.  CT abdomen pelvis with contrast negative for renal calculus or obstructive uropathy.  Showing trace right pleural effusion with patchy airspace disease at the right lung base concerning for pneumonia.  Also showing prominent right hilum with a hypodense region in the right pulmonary artery only partially visualized on exam, possible PE versus prominent lymph nodes.  Dedicated CT angiogram chest recommended for further evaluation.  Patient was given fentanyl, IV heparin, ceftriaxone, azithromycin, and 2 L LR boluses.  Repeat lactate 1.8.  Hospital service was consulted for further evaluation and management of altered mental status/confusion with concern for pneumonia versus PE.  ? ?**Interim History ?She continues to have extremely poor p.o. intake has been refusing her medications orally.  Palliative Care has been consulted for goals of care discussion and plan is for family meeting today at 12:00 pm.   ? ?Assessment and Plan: ? ?Sepsis,  POA ?Right-sided bronchopneumonia ?-Patient presenting with elevated WBC count of 10.2, chest x-Hebel with evidence of right-sided pneumonia.   ?-WBC has improved and is now 5.9 ?-Strep pneumo urinary antigen: Negative.   ?-Given patient's presenting symptoms of confusion greater than her typical baseline we will continue treatment for pneumonia x5 days. ?-Continuing Azithromycin 5 mg IV q24h x 5 days and Ceftriaxone 2 g IV q24h x 5 days and now stopped ?-SpO2: 100 % ?-Respiratory Status appears stable  ?-Repeat CXR this AM is pending  ?  ?Bilateral pulmonary embolism with pulmonary infarct ?-CT angiogram chest with extensive chronic appearing pulmonary emboli within the distal right pulmonary artery extending to the right middle lobe and right lower lobar and segmental pulmonary arteries, segmental/subsegmental pulm emboli noted within the left lower lobe which are age-indeterminate, CT evidence of right heart strain.   ?-Patient was initially started on heparin drip and now transition to Lovenox.  TTE with preserved LVEF, RV function within normal limits. ?-D-Dimer was 3.28  ?-Transitioned Lovenox to 1.5 mg/kg once daily dosing for ease of administration by family ?-TOC for benefit check for Lovenox; patient unlikely to comply with oral medications due to diversion and refusing oral intake ?  ?Acute metabolic encephalopathy ?Hx dementia ?-Patient presenting from home with confusion in the setting of history of advanced dementia.  Patient appears to be at baseline, but concern for worsening dementia/progression given her poor oral intake.   ?-Treating pneumonia/PE as above.  Vitamin B12 210, TSH 1.010, urinalysis unrevealing. ?-C/w Delirium precautions ?--Get up during the day ?-Encourage a familiar face to remain present throughout  the day ?-Keep blinds open and lights on during daylight hours ?-Minimize the use of opioids/benzodiazepines ?-Continues to be significantly confused today  ?   ?Hypokalemia ?-Repleted. ?-K+ went from 3.2 -> 3.9 -> 4.0 ?-Continue to Monitor and Replete as Necessary  ?-Repeat CMP in the AM  ?  ?AKI ?Metabolic Acidosis  ?-Creatinine 1.06 on admission, supported with IV fluid hydration with improvement of creatinine to 0.67. ?-Now BUN/Cr is <5/0.77 yesterday and today is <5/0.83 ?-Continued D5 1/2 NS + 40 mEQ Kcl at 75 mL/hr for now given her poor po intake but will change to Sodium Bicarbonate 150 mEQ in D5W at 75 mL/hr x 1 Day given her Metabolic Acidosis  ?-Now has a Metabolic Acidosis with a CO2 of 17, AG of 10, and Chloride of 106 ?-Avoid nephrotoxic medications, contrast dyes, hypotension and dehydration renally dose medications ?-Repeat BMP/CMP in the a.m. ? ?Hyponatremia ?-Patient's Na+ went from 137 -> 133 ?-Continue to Monitor and Trend ?-Replete with IV Sodium Phos 30 mmol ?-Continue to Monitor and Replete as Necessary ?-Repeat CMP in the AM  ? ?Hypophosphatemia ?-Patient's Phos Level is now 2.0 ?-Replete with IV Sodium Phos 30 mmol ?-Continue to Monitor and Replete as Necessary ?-Repeat Phos Level in the AM  ?  ?Hyperbilirubinemia ?-Resolved. T Bili went from 1.6 -> 0.8 -> 0.5 ?-Continue to Monitor and Trend ?-Repeat CMP in the AM  ?  ?Hypomagnesemia ?-Patient's mag level was 2.0 this morning ?-Replete with IV mag sulfate 2 g yesterday  ?-Continue monitor and replete as necessary ?-Repeat mag level in a.m. ?  ?Normocytic Anemia ?-Likely Dilutional Drop from IVF as above  ?-Patient's hemoglobin/hematocrit went from 13.4/41.8 -> 12.0/35.7 -> 10.5/31.8 -> 10.9/31.3 -> 11.7/34.0 ?-Check anemia panel in the a.m. ?-Continue monitor for signs and symptoms bleeding; no overt bleeding noted  ?-Repeat CBC in the ?  ?Adult failure to Thrive ?Hypoalbuminemia ?-Etiology likely secondary to progressive dementia.   ?-Albumin Level was 2.2  ?-Family reports poor oral intake at baseline.  Has declined consideration for G-tube in the past.  [place]. [place] has discussed with patient's  daughter, Monica Erickson regarding the progressive nature of dementia as she is likely nearing end-stage. ?-Palliative care consulted for assistance with goals of care/medical decision making and family conference today 02/27/22 at 12:00 pm  ? ?DVT prophylaxis: Anticoagulated with Enoxaparin 100 mg sq q24h ? ?  Code Status: Full Code ?Family Communication: No family present at bedside  ? ?Disposition Plan:  ?Level of care: Telemetry Medical ?Status is: Inpatient ?Remains inpatient appropriate because: Having Palliative Care discussions and will need Home Health PT/OT at D/C  ?  ?Consultants:  ?Palliative Care Medicine  ? ?Procedures:  ?None ? ?Antimicrobials:  ?Anti-infectives (From admission, onward)  ? ? Start     Dose/Rate Route Frequency Ordered Stop  ? 02/22/22 2315  cefTRIAXone (ROCEPHIN) 2 g in sodium chloride 0.9 % 100 mL IVPB       ? 2 g ?200 mL/hr over 30 Minutes Intravenous Every 24 hours 02/22/22 2314 02/27/22 0756  ? 02/22/22 2315  azithromycin (ZITHROMAX) 500 mg in sodium chloride 0.9 % 250 mL IVPB       ? 500 mg ?250 mL/hr over 60 Minutes Intravenous Every 24 hours 02/22/22 2314 02/27/22 0756  ? ?  ?  ?Subjective: ?Seen and examined at bedside and she remains significantly confused today.  She is pleasantly demented and denies any pain but states that she is "hungry".  Per report she is still refusing all her  oral intake and does not want to eat very much.  No nausea or vomiting.  Denies any lightheadedness or dizziness.  No other concerns or complaints at this time. ? ?Objective: ?Vitals:  ? 02/27/22 0726 02/27/22 0900 02/27/22 1100 02/27/22 1151  ?BP: (!) 148/93   (!) 137/96  ?Pulse:    69  ?Resp: 20 18 16 20   ?Temp: 99.1 ?F (37.3 ?C)   99 ?F (37.2 ?C)  ?TempSrc: Axillary   Axillary  ?SpO2: 100%   100%  ?Weight:      ?Height:      ? ? ?Intake/Output Summary (Last 24 hours) at 02/27/2022 1244 ?Last data filed at 02/27/2022 [id number] ?Gross per 24 hour  ?Intake 1205.48 ml  ?Output 1250 ml  ?Net -44.52 ml  ? ?Filed  Weights  ? 02/23/22 0035  ?Weight: 65.1 kg  ? ?Examination: ?Physical Exam: ? ?Constitutional: Elderly African-American female who is pleasantly confused and demented in no acute distress  ?Respiratory: Diminished to auscultation bilate

## 2022-02-27 NOTE — Progress Notes (Addendum)
Physical Therapy Treatment ?Patient Details ?Name: Monica Erickson ?MRN: [id number] ?DOB: [birth year] ?Today's Date: 02/27/2022 ? ? ?History of Present Illness 76 y.o. female admitted 4/15 with sepsis secondary to CAP, acute metabolic encephalopathy, possible PE, anorexia.  Recently seen in the ED on 4/5 for dehydration, UTI, and possible H. pylori infection. Prior medical history significant of dementia, chronic headaches, depression, gastric ulcer, H. pylori infection, nephrolithiasis, sleep apnea not on CPAP. ? ?  ?PT Comments  ? ? Pt restless and anxious upon PT arrival to room, requires multimodal cuing to understand what PT is asking of her throughout session. Pt mobilizing in hallway at min assist level for steadying and guiding pt path, requires frequent safety cues which pt inconsistently follows. PT assisted pt in wash-up task at sink, able to complete ~25% of the task and PT assist required for the rest. PT follow up plan remains appropriate as long as pt has 24/7 assist post-acutely.  ?   ?Recommendations for follow up therapy are one component of a multi-disciplinary discharge planning process, led by the attending physician.  Recommendations may be updated based on patient status, additional functional criteria and insurance authorization. ? ?Follow Up Recommendations ? Home health PT ?  ?  ?Assistance Recommended at Discharge Frequent or constant Supervision/Assistance  ?Patient can return home with the following A little help with bathing/dressing/bathroom;Assistance with cooking/housework;Direct supervision/assist for medications management;Direct supervision/assist for financial management;Assist for transportation;A little help with walking and/or transfers ?  ?Equipment Recommendations ? None recommended by PT  ?  ?Recommendations for Other Services   ? ? ?  ?Precautions / Restrictions Precautions ?Precautions: Fall ?Restrictions ?Weight Bearing Restrictions: No  ?  ? ?Mobility ? Bed Mobility ?Overal bed  mobility: Needs Assistance ?Bed Mobility: Supine to Sit, Sit to Supine ?  ?  ?Supine to sit: Mod assist ?Sit to supine: Mod assist ?  ?General bed mobility comments: mod assist for trunk lift, LE lifting back into bed and boost up. ?  ? ?Transfers ?Overall transfer level: Needs assistance ?Equipment used: 1 person hand held assist ?Transfers: Sit to/from Stand ?Sit to Stand: Min assist ?  ?  ?  ?  ?  ?General transfer comment: light assist for rise and steady, pt reaching with both UEs ?  ? ?Ambulation/Gait ?Ambulation/Gait assistance: Min assist ?Gait Distance (Feet): 150 Feet ?Assistive device: 1 person hand held assist ?Gait Pattern/deviations: Step-through pattern, Decreased stride length, Trunk flexed, Drifts right/left, Narrow base of support ?Gait velocity: decreased ?  ?  ?General Gait Details: light steadying assist, directional cuing, and cues for widening BOS not followed. ? ? ?Stairs ?  ?  ?  ?  ?  ? ? ?Wheelchair Mobility ?  ? ?Modified Rankin (Stroke Patients Only) ?  ? ? ?  ?Balance Overall balance assessment: Needs assistance ?Sitting-balance support: Feet supported, No upper extremity supported ?Sitting balance-Leahy Scale: Fair ?  ?  ?Standing balance support: During functional activity ?Standing balance-Leahy Scale: Fair ?  ?  ?  ?  ?  ?  ?  ?  ?  ?  ?  ?  ?  ? ?  ?Cognition Arousal/Alertness: Awake/alert ?Behavior During Therapy: [facility] for tasks assessed/performed ?Overall Cognitive Status: History of cognitive impairments - at baseline ?  ?  ?  ?  ?  ?  ?  ?  ?  ?  ?  ?  ?  ?  ?  ?  ?General Comments: history of dementia, significant motor planning deficits even with functional  tasks. Pt appears anxious and startles easily, even with clear instructions given repeatedly. ?  ?  ? ?  ?Exercises   ? ?  ?General Comments   ?  ?  ? ?Pertinent Vitals/Pain Pain Assessment ?Pain Assessment: Faces ?Faces Pain Scale: Hurts a little bit ?Pain Location: generalized ?Pain Descriptors / Indicators: Crying,  Guarding ?Pain Intervention(s): Limited activity within patient's tolerance, Monitored during session, Repositioned  ? ? ?Home Living   ?  ?  ?  ?  ?  ?  ?  ?  ?  ?   ?  ?Prior Function    ?  ?  ?   ? ?PT Goals (current goals can now be found in the care plan section) Acute Rehab PT Goals ?Patient Stated Goal: pt's dtr hopes that staff will keep her mobile and at an appropriate level of conditioning ?PT Goal Formulation: With patient/family ?Time For Goal Achievement: 03/11/22 ?Potential to Achieve Goals: Fair ?Progress towards PT goals: Progressing toward goals ? ?  ?Frequency ? ? ? Min 3X/week ? ? ? ?  ?PT Plan Current plan remains appropriate  ? ? ?Co-evaluation   ?  ?  ?  ?  ? ?  ?AM-PAC PT "6 Clicks" Mobility   ?Outcome Measure ? Help needed turning from your back to your side while in a flat bed without using bedrails?: A Little ?Help needed moving from lying on your back to sitting on the side of a flat bed without using bedrails?: A Little ?Help needed moving to and from a bed to a chair (including a wheelchair)?: A Little ?Help needed standing up from a chair using your arms (e.g., wheelchair or bedside chair)?: A Little ?Help needed to walk in hospital room?: A Little ?Help needed climbing 3-5 steps with a railing? : A Lot ?6 Click Score: 17 ? ?  ?End of Session Equipment Utilized During Treatment: Gait belt ?Activity Tolerance: Patient tolerated treatment well;Patient limited by pain ?Patient left: in bed;with call bell/phone within reach;with bed alarm set ?Nurse Communication: Mobility status ?PT Visit Diagnosis: Unsteadiness on feet (R26.81) ?  ? ? ?Time: 1003-1030 ?PT Time Calculation (min) (ACUTE ONLY): 27 min ? ?Charges:  $Gait Training: 8-22 mins ?$Self Care/Home Management: 8-22          ?          ? ?Monica Erickson, PT DPT ?Acute Rehabilitation Services ?Pager [phone]  ?Office [phone] ? ? ? ?Monica Erickson ?02/27/2022, 11:36 AM ? ?

## 2022-02-28 DIAGNOSIS — E876 Hypokalemia: Secondary | ICD-10-CM | POA: Diagnosis not present

## 2022-02-28 DIAGNOSIS — A048 Other specified bacterial intestinal infections: Secondary | ICD-10-CM

## 2022-02-28 DIAGNOSIS — D649 Anemia, unspecified: Secondary | ICD-10-CM

## 2022-02-28 DIAGNOSIS — F0390 Unspecified dementia without behavioral disturbance: Secondary | ICD-10-CM | POA: Diagnosis not present

## 2022-02-28 DIAGNOSIS — R627 Adult failure to thrive: Secondary | ICD-10-CM | POA: Diagnosis not present

## 2022-02-28 DIAGNOSIS — R4182 Altered mental status, unspecified: Secondary | ICD-10-CM | POA: Diagnosis not present

## 2022-02-28 DIAGNOSIS — R630 Anorexia: Secondary | ICD-10-CM | POA: Diagnosis not present

## 2022-02-28 LAB — COMPREHENSIVE METABOLIC PANEL
ALT: 10 U/L (ref 0–44)
ALT: 8 U/L (ref 0–44)
AST: 19 U/L (ref 15–41)
AST: 20 U/L (ref 15–41)
Albumin: 2.5 g/dL — ABNORMAL LOW (ref 3.5–5.0)
Albumin: 2.6 g/dL — ABNORMAL LOW (ref 3.5–5.0)
Alkaline Phosphatase: 76 U/L (ref 38–126)
Alkaline Phosphatase: 78 U/L (ref 38–126)
Anion gap: 9 (ref 5–15)
Anion gap: 10 (ref 5–15)
BUN: <5 mg/dL — ABNORMAL LOW (ref 8–23)
BUN: <5 mg/dL — ABNORMAL LOW (ref 8–23)
CO2: 25 mmol/L (ref 22–32)
CO2: 25 mmol/L (ref 22–32)
Calcium: 8.5 mg/dL — ABNORMAL LOW (ref 8.9–10.3)
Calcium: 8.4 mg/dL — ABNORMAL LOW (ref 8.9–10.3)
Chloride: 105 mmol/L (ref 98–111)
Chloride: 103 mmol/L (ref 98–111)
Creatinine, Ser: 0.75 mg/dL (ref 0.44–1.00)
Creatinine, Ser: 0.82 mg/dL (ref 0.44–1.00)
GFR, Estimated: >60 mL/min (ref >60)
GFR, Estimated: >60 mL/min (ref >60)
Glucose, Bld: 93 mg/dL (ref 70–99)
Glucose, Bld: 82 mg/dL (ref 70–99)
Potassium: 4.4 mmol/L (ref 3.5–5.1)
Potassium: 4.2 mmol/L (ref 3.5–5.1)
Sodium: 139 mmol/L (ref 135–145)
Sodium: 138 mmol/L (ref 135–145)
Total Bilirubin: 0.8 mg/dL (ref 0.3–1.2)
Total Bilirubin: 0.4 mg/dL (ref 0.3–1.2)
Total Protein: 5.9 g/dL — ABNORMAL LOW (ref 6.5–8.1)
Total Protein: 5.8 g/dL — ABNORMAL LOW (ref 6.5–8.1)

## 2022-02-28 LAB — CBC WITH DIFFERENTIAL/PLATELET
Abs Immature Granulocytes: 0.04 10*3/uL (ref 0.00–0.07)
Basophils Absolute: 0 10*3/uL (ref 0.0–0.1)
Basophils Relative: 1 %
Eosinophils Absolute: 0.2 10*3/uL (ref 0.0–0.5)
Eosinophils Relative: 3 %
HCT: 31.7 % — ABNORMAL LOW (ref 36.0–46.0)
Hemoglobin: 10.7 g/dL — ABNORMAL LOW (ref 12.0–15.0)
Immature Granulocytes: 1 %
Lymphocytes Relative: 35 %
Lymphs Abs: 2 10*3/uL (ref 0.7–4.0)
MCH: 29.4 pg (ref 26.0–34.0)
MCHC: 33.8 g/dL (ref 30.0–36.0)
MCV: 87.1 fL (ref 80.0–100.0)
Monocytes Absolute: 0.4 10*3/uL (ref 0.1–1.0)
Monocytes Relative: 7 %
Neutro Abs: 3.1 10*3/uL (ref 1.7–7.7)
Neutrophils Relative %: 53 %
Platelets: 195 10*3/uL (ref 150–400)
RBC: 3.64 MIL/uL — ABNORMAL LOW (ref 3.87–5.11)
RDW: 15.5 % (ref 11.5–15.5)
WBC: 5.8 10*3/uL (ref 4.0–10.5)
nRBC: 0 % (ref 0.0–0.2)

## 2022-02-28 LAB — PHOSPHORUS: Phosphorus: 4.2 mg/dL (ref 2.5–4.6)

## 2022-02-28 LAB — CULTURE, BLOOD (ROUTINE X 2)
Culture: NO GROWTH
Special Requests: ADEQUATE

## 2022-02-28 LAB — MAGNESIUM: Magnesium: 1.9 mg/dL (ref 1.7–2.4)

## 2022-02-28 NOTE — Consult Note (Signed)
? ? ?Consultation ? ?Referring Provider: DR Sheikh/[facility] ?Primary Care Physician:  [name], NP ?Primary Gastroenterologist: [name] ? ?Reason for Consultation:  abdominal pain ? ?HPI: Monica Erickson is a 76 y.o. female, who was admitted 5 days ago from home with confusion, lethargy and complaints of flank pain.  Patient has Alzheimer's dementia, she was diagnosed with a UTI, which has been treated. ?CT of the abdomen pelvis on admission with contrast showed no obstructive uropathy, trace right pleural effusion, patchy airspace disease in the right lung base concerning for pneumonia, also showed a prominent right hilum with hypodense region in the right pulmonary artery consistent with possible PE.  No acute findings in the abdomen ? CT angio and confirmed extensive chronic appearing pulmonary emboli with signs of partial recanalization of the central pulmonary emboli there is still an occlusive thrombus in the segmental branches of the right middle lobe right lower lobe, evidence of right heart strain consistent with at least submassive PE. ?She has been on heparin and is now converted to Lovenox. ?She has had extremely poor p.o. intake since she has been here, and has been refusing her oral medications.  Palliative care has evaluated her for goals of care-Per notes family desires full scope of care, no feeding tube. ? ?Today she was noticed to have abdominal pain on exam and was grimacing to exam.  She has not had any vomiting, she has been afebrile, she has no complaints of abdominal pain.  Patient is really unable to offer any helpful history. ? ?GI was asked to evaluate her because of the abdominal pain. ?She was seen GI in May 2021/Dr. [name] and had undergone upper endoscopy with finding of mild gastritis, had a linear ulcer in the duodenal bulb with slight deformity able to pass the scope without difficulty.  Path returned positive for H. Pylori ?By reviewing her chart she was prescribed a course of  antibiotics however family had called back stating that the patient could not swallow the pills ? ?Also had colonoscopy at that same time with increase stool in the right colon also noted multiple diverticuli and otherwise negative exam. ? ?Labs today WBC 5.8/hemoglobin 10.7/hematocrit 31.7, LFTs within normal limits. ? ? ?Past Medical History:  ?Diagnosis Date  ? Chronic headaches   ? Dementia (HCC)   ? ALZHEIMERS FOR A YEAR  ? Depression   ? Difficult intravenous access 2021  ? needs anesthesia , IV team, ultrasound for IV   ? Gastric ulcer   ? H. pylori infection 03/2020  ? Kidney stones   ? Night terrors   ? Sleep apnea   ? no cpap  ? ? ?Past Surgical History:  ?Procedure Laterality Date  ? BIOPSY  03/22/2020  ? Procedure: BIOPSY;  Surgeon: Rachael Fee, MD;  Location: [facility];  Service: Endoscopy;;  ? Breast Lift    ? CATARACT EXTRACTION Bilateral 01/2020  ? COLONOSCOPY WITH PROPOFOL N/A 03/22/2020  ? Procedure: COLONOSCOPY WITH PROPOFOL;  Surgeon: Rachael Fee, MD;  Location: [facility];  Service: Endoscopy;  Laterality: N/A;  ? CYSTOSCOPY WITH STENT PLACEMENT Right 12/07/2018  ? Procedure: CYSTOSCOPY WITH STENT PLACEMENT;  Surgeon: Crist Fat, MD;  Location: [facility];  Service: Urology;  Laterality: Right;  ? CYSTOSCOPY/URETEROSCOPY/HOLMIUM LASER/STENT PLACEMENT Right 12/31/2018  ? Procedure: CYSTOSCOPY RIGHT URETEROSCOPY/HOLMIUM LASER RIGHT Lucretia Roers;  Surgeon: Crist Fat, MD;  Location: [facility];  Service: Urology;  Laterality: Right;  ? ESOPHAGOGASTRODUODENOSCOPY (EGD) WITH PROPOFOL N/A 03/22/2020  ?  Procedure: ESOPHAGOGASTRODUODENOSCOPY (EGD) WITH PROPOFOL;  Surgeon: Rachael FeeJacobs, Daniel P, MD;  Location: [facility];  Service: Endoscopy;  Laterality: N/A;  ? REPLACEMENT TOTAL KNEE Bilateral 5 YRS AGO  ? Tummy tuck    ? ? ?Prior to Admission medications   ?Medication Sig Start Date End Date Taking? Authorizing Provider  ?Nutritional Supplements (ENSURE  ACTIVE) LIQD Take 1 Bottle by mouth daily. ?Patient taking differently: Take 1 Bottle by mouth in the morning and at bedtime. strawberry 02/12/22 03/14/22 Yes Trifan, Kermit BaloMatthew J, MD  ?simethicone (MYLICON) 125 MG chewable tablet Chew 125 mg by mouth every 6 (six) hours as needed for flatulence.   Yes [provider]  ?fluticasone (FLONASE) 50 MCG/ACT nasal spray Place 2 sprays into both nostrils daily. ?Patient not taking: Reported on 02/13/2022 12/23/21   Barbette MerinoKing, Crystal M, NP  ?Lactulose 20 GM/30ML SOLN Take 15 mLs (10 g total) by mouth once a week. ?Patient not taking: Reported on 02/23/2022 12/27/21   Barbette MerinoKing, Crystal M, NP  ?mirtazapine (REMERON SOL-TAB) 15 MG disintegrating tablet Take 1 tablet (15 mg total) by mouth at bedtime. ?Patient not taking: Reported on 02/23/2022 12/23/21 03/23/22  Barbette MerinoKing, Crystal M, NP  ?omeprazole (FIRST-OMEPRAZOLE) 2 mg/mL SUSP oral suspension Take 10 mLs (20 mg total) by mouth daily for 14 days. ?Patient not taking: Reported on 02/23/2022 02/12/22 02/26/22  Terald Sleeperrifan, Matthew J, MD  ? ? ?Current Facility-Administered Medications  ?Medication Dose Route Frequency Provider Last Rate Last Admin  ? acetaminophen (TYLENOL) tablet 650 mg  650 mg Oral Q6H PRN John Giovanniathore, Vasundhra, MD      ? Or  ? acetaminophen (TYLENOL) suppository 650 mg  650 mg Rectal Q6H PRN John Giovanniathore, Vasundhra, MD      ? alum & mag hydroxide-simeth (MAALOX/MYLANTA) 200-200-20 MG/5ML suspension 30 mL  30 mL Oral Once Barbara CowerMahan, Kasie J, NP      ? enoxaparin (LOVENOX) injection 100 mg  100 mg Subcutaneous Q24H Hammons, Kimberly B, RPH   100 mg at 02/28/22 1016  ? folic acid (FOLVITE) tablet 1 mg  1 mg Oral Daily Tyrone NineGrunz, Ryan B, MD      ? multivitamin with minerals tablet 1 tablet  1 tablet Oral Daily Tyrone NineGrunz, Ryan B, MD      ? pantoprazole (PROTONIX) injection 40 mg  40 mg Intravenous Q24H Barbara CowerMahan, Kasie J, NP   40 mg at 02/27/22 1435  ? sodium bicarbonate 150 mEq in dextrose 5 % 1,150 mL infusion   Intravenous Continuous Marguerita MerlesSheikh, Omair BelleviewLatif, DO 75  mL/hr at 02/28/22 0700 New Bag at 02/28/22 0700  ? vitamin B-12 (CYANOCOBALAMIN) tablet 1,000 mcg  1,000 mcg Oral Daily Tyrone NineGrunz, Ryan B, MD      ? ? ?Allergies as of 02/22/2022  ? (No Known Allergies)  ? ? ?Family History  ?Problem Relation Age of Onset  ? Diabetes Mother   ? Hypertension Father   ? Colon cancer Neg Hx   ? Esophageal cancer Neg Hx   ? Stomach cancer Neg Hx   ? Rectal cancer Neg Hx   ? ? ?Social History  ? ?Socioeconomic History  ? Marital status: Single  ?  Spouse name: Not on file  ? Number of children: 3  ? Years of education: Not on file  ? Highest education level: Not on file  ?Occupational History  ? Occupation: retired  ?Tobacco Use  ? Smoking status: Former  ?  Years: 5.00  ?  Types: Cigarettes  ? Smokeless tobacco: Never  ?Vaping Use  ?  Vaping Use: Never used  ?Substance and Sexual Activity  ? Alcohol use: Not Currently  ?  Comment: occasional wine  ? Drug use: Never  ? Sexual activity: Not Currently  ?Other Topics Concern  ? Not on file  ?Social History Narrative  ? Not on file  ? ?Social Determinants of Health  ? ?Financial Resource Strain: Not on file  ?Food Insecurity: Not on file  ?Transportation Needs: Not on file  ?Physical Activity: Not on file  ?Stress: Not on file  ?Social Connections: Not on file  ?Intimate Partner Violence: Not on file  ? ? ?Review of Systems: ? ?Pertinent positive and negative review of systems were noted in the above HPI section.  All other review of systems was otherwise negative.  ? ?Physical Exam: ?Vital signs in last 24 hours: ?Temp:  [97.5 ?F (36.4 ?C)-99.8 ?F (37.7 ?C)] 97.5 ?F (36.4 ?C) (04/21 3664) ?Pulse Rate:  [74-82] 82 (04/21 0500) ?Resp:  [15-21] 21 (04/21 1100) ?BP: (115-157)/(73-88) 115/73 (04/21 4034) ?SpO2:  [97 %-100 %] 100 % (04/21 0722) ?Last BM Date :  (PTA) ?General:   Alert,  Well-developed, well-nourished, older African-American female pleasant and cooperative in NAD, well groomed ?Head:  Normocephalic and atraumatic. ?Eyes:  Sclera clear,  no icterus.   Conjunctiva pink. ?Ears:  Normal auditory acuity. ?Nose:  No deformity, discharge,  or lesions. ?Mouth:  No deformity or lesions.   ?Neck:  Supple; no masses or thyromegaly. ?Lungs:  Clear throu

## 2022-02-28 NOTE — Progress Notes (Signed)
?PROGRESS NOTE ? ? ? ?Monica Erickson  RE:[id number] DOB: 12-10-1945 DOA: 02/22/2022 ?PCP: Vevelyn Francois, NP  ? ?Brief Narrative:  ?The patient Monica Erickson is a 76 y.o. female with medical history significant of dementia, chronic headaches, depression, gastric ulcer, H. pylori infection, nephrolithiasis, sleep apnea not on CPAP who presented to [facility] ED on 4/15 for evaluation of confusion, somnolence, and flank pain.  Patient is oriented to self only and not able to give any history. ?  ?Recently seen in the ED on 4/5 for dehydration, UTI, and possible H. pylori infection.  GI recommended outpatient follow-up. ?  ?In the ED, patient was found to be slightly tachycardic on arrival with low blood pressure in the 90s. Not febrile.  WBC 10.1.  BUN 15, creatinine 1.0.  T. bili 1.6, remainder of LFTs normal.  Lactic acid 2.5.  INR 1.3.  Blood cultures drawn.  UA pending.  Chest x-Hosmer showing patchy airspace disease within the right mid and lower lung zones consistent with bronchopneumonia.  CT abdomen pelvis with contrast negative for renal calculus or obstructive uropathy.  Showing trace right pleural effusion with patchy airspace disease at the right lung base concerning for pneumonia.  Also showing prominent right hilum with a hypodense region in the right pulmonary artery only partially visualized on exam, possible PE versus prominent lymph nodes.  Dedicated CT angiogram chest recommended for further evaluation.  Patient was given fentanyl, IV heparin, ceftriaxone, azithromycin, and 2 L LR boluses.  Repeat lactate 1.8.  Hospital service was consulted for further evaluation and management of altered mental status/confusion with concern for pneumonia versus PE.  ? ?**Interim History ?She continues to have extremely poor p.o. intake has been refusing her medications orally.  Palliative Care has been consulted for goals of care discussion and patient had a family conference yesterday with palliative care and was a complicated  situation given that the patient is still not eating or drinking.  She does have a history of a gastric ulcer and has a history of H. pylori and palliative was concerned that there may be an element of stomach pain so they have initiated IV Protonix and given ondansetron and recommended touching base with GI.  She has been consulted for further evaluation to see if we could possibly help her p.o. intake and if she would benefit from treatment.  We will continue with palliative goals of care conversation but patient still not eating and she is very pleasantly demented still.  Patient's family believes that she may improve her oral intake when she is at home.  ? ? ?Assessment and Plan: ?No notes have been filed under this hospital service. ?Service: Hospitalist ? ?Sepsis, POA and now resolved  ?Right-sided bronchopneumonia, improving  ?-Patient presenting with elevated WBC count of 10.2, chest x-Teti with evidence of right-sided pneumonia.   ?-WBC has improved and is now 5.8 ?-Strep pneumo urinary antigen: Negative.   ?-Given patient's presenting symptoms of confusion greater than her typical baseline we will continue treatment for pneumonia x5 days. ?-Completed Azithromycin 5 mg IV q24h x 5 days and Ceftriaxone 2 g IV q24h x 5 days and now stopped ?-SpO2: 100 % ?-Respiratory Status appears stable  ?-Repeat CXR showed "Improving appearance of airspace process in the RIGHT lower lobe. No new areas of consolidation." ?  ?Bilateral pulmonary embolism with pulmonary infarct ?-CT angiogram chest with extensive chronic appearing pulmonary emboli within the distal right pulmonary artery extending to the right middle lobe and right lower lobar and  segmental pulmonary arteries, segmental/subsegmental pulm emboli noted within the left lower lobe which are age-indeterminate, CT evidence of right heart strain.   ?-Patient was initially started on heparin drip and now transition to Lovenox.  TTE with preserved LVEF, RV function  within normal limits. ?-D-Dimer was 3.28  ?-Transitioned Lovenox to 1.5 mg/kg once daily dosing for ease of administration by family (ON Enoxaparin 100 mg sq q24h) ?-TOC for benefit check for Lovenox; patient unlikely to comply with oral medications due to diversion and refusing oral intake ?  ?Acute metabolic encephalopathy ?Hx dementia ?-Patient presenting from home with confusion in the setting of history of advanced dementia.  Patient appears to be at baseline, but concern for worsening dementia/progression given her poor oral intake.   ?-Treating pneumonia/PE as above.  Vitamin B12 210, TSH 1.010, urinalysis unrevealing. ?-C/w Delirium precautions ?--Get up during the day ?-Encourage a familiar face to remain present throughout the day ?-Keep blinds open and lights on during daylight hours ?-Minimize the use of opioids/benzodiazepines ?-Continues to be significantly confused today  ?  ?Hypokalemia ?-Repleted. ?-K+ went from 3.2 -> 3.9 -> 4.0 -> 4.4 ?-Continue to Monitor and Replete as Necessary  ?-Repeat CMP in the AM  ?  ?AKI ?Metabolic Acidosis  ?-Creatinine 1.06 on admission, supported with IV fluid hydration with improvement of creatinine to 0.67. ?-Now BUN/Cr is <5/0.75 ?-Continued D5 1/2 NS + 40 mEQ Kcl at 75 mL/hr for now given her poor po intake but will change to Sodium Bicarbonate 150 mEQ in D5W at 75 mL/hr x 1 Day given her Metabolic Acidosis  ?-Had a Metabolic Acidosis with a CO2 of 17, AG of 10, and Chloride of 106 and now with a CO2 of 25, chloride level of 105, and anion gap of 9 ?-Avoid nephrotoxic medications, contrast dyes, hypotension and dehydration renally dose medications ?-Repeat BMP/CMP in the a.m. ?  ?Hyponatremia ?-Patient's Na+ went from 137 -> 133 and is now 139 ?-Continue to Monitor and Trend ?-Replete with IV Sodium Phos 30 mmol yesterday ?-Continue to Monitor and Replete as Necessary ?-Repeat CMP in the AM  ?  ?Hypophosphatemia ?-Patient's Phos Level is now 4.2 ?-Replete with IV  Sodium Phos 30 mmol yesterday ?-Continue to Monitor and Replete as Necessary ?-Repeat Phos Level in the AM  ? ?History of gastritis and H. pylori ulcer ?-Was not treated as an outpatient and will initiate IV Pantoprazole 40 mg x1 ?-GI Consulted for further evalaution ?  ?Hyperbilirubinemia ?-Resolved. T Bili went from 1.6 -> 0.8 -> 0.5 and is now 0.8 ?-Continue to Monitor and Trend ?-Repeat CMP in the AM  ?  ?Hypomagnesemia ?-Patient's mag level was 1.9 ?-Continue monitor and replete as necessary ?-Repeat mag level in a.m. ?  ?Normocytic Anemia ?-Likely Dilutional Drop from IVF as above  ?-Patient's hemoglobin/hematocrit went from 13.4/41.8 -> 12.0/35.7 -> 10.5/31.8 -> 10.9/31.3 -> 11.7/34.0 -> 10.7/31.7 ?-Check anemia panel in the a.m. ?-Continue monitor for signs and symptoms bleeding; no overt bleeding noted  ?-Repeat CBC in the ?  ?Adult failure to Thrive ?Hypoalbuminemia ?-Etiology likely secondary to progressive dementia.   ?-Albumin Level was 2.7 -> 2.5 ?-Family reports poor oral intake at baseline.  Has declined consideration for G-tube in the past.  Dr. [place] has discussed with patient's daughter, [name] regarding the progressive nature of dementia as she is likely nearing end-stage. ?-Palliative care consulted for assistance with goals of care/medical decision making and family conference done and and there is concern that she may not be eating  given her gastric ulcer and history of H. pylori so we will consult GI for further evaluation recommendation for ?-Initiated on IV pantoprazole 40 mg once ?-She continues not have any appetite and does not eat or drink but given her history of ulcer and positive H. pylori and element of stomach pain/symptoms that she cannot communicate given her progressive dementia we are Trialing IV Protonix and ondansetron and consulting GI for further evaluation and recommendations; currently she is not eligible for hospice based on her dementia alone but if she continues to not  eat and drink and has further decline in her functional status she will become eligible very quickly ?-Currently the goals of care are to stabilize the patient where she can be discharged home with PT OT a

## 2022-02-28 NOTE — Progress Notes (Signed)
ANTICOAGULATION CONSULT NOTE - Follow Up Consult ? ?Pharmacy Consult for Lovenox  ?Indication: PE ? ?No Known Allergies ? ?Patient Measurements: ?Height: 5\' 8"  (172.7 cm) ?Weight: 65.1 kg (143 lb 8.3 oz) ?IBW/kg (Calculated) : 63.9 ? ?Vital Signs: ?Temp: 97.5 ?F (36.4 ?C) (04/21 08-19-1996) ?Temp Source: Axillary (04/21 08-19-1996) ?BP: 115/73 (04/21 08-19-1996) ?Pulse Rate: 82 (04/21 0500) ? ?Labs: ?Recent Labs  ?  02/26/22 ?0323 02/27/22 ?0143 02/27/22 ?03/01/22 02/28/22 ?0239  ?HGB 10.9* 11.7*  --  10.7*  ?HCT 31.3* 34.0*  --  31.7*  ?PLT 232 307  --  195  ?CREATININE 0.77  --  0.83 0.75  ? ? ? ?Estimated Creatinine Clearance: 62.2 mL/min (by C-G formula based on SCr of 0.75 mg/dL). ? ? ?Assessment: ?76 year old female currently on SQ Lovenox for + PE.  Pt has advanced dementia and is intermittently refusing oral medications and food.  Plan for ongoing anticoagulation with Lovenox 1.5mg /kg q24h for ease of administration by family on discharge.  Please note, Lovenox copay anticipated to be $100 per month. ? ?Hgb stable 10-11, SCr <1 ? ?Goal of Therapy:  ?Anti-Xa level 0.6-1 units/ml 4hrs after LMWH dose given ?Monitor platelets by anticoagulation protocol: Yes ?  ?Plan:  ?Continue Lovenox 1.5 mg/kg (100mg ) q 24 hours  ?Monitor CBC, renal fx and watch for s/s of bleeding ? ? ?67, Pharm.D., BCPS ?Clinical Pharmacist ? ?**Pharmacist phone directory can be found on [email or web address] listed under [facility]. ? ?02/28/2022 9:35 AM ? ? ?

## 2022-03-01 DIAGNOSIS — A048 Other specified bacterial intestinal infections: Secondary | ICD-10-CM | POA: Diagnosis not present

## 2022-03-01 DIAGNOSIS — R109 Unspecified abdominal pain: Secondary | ICD-10-CM

## 2022-03-01 DIAGNOSIS — R630 Anorexia: Secondary | ICD-10-CM | POA: Diagnosis not present

## 2022-03-01 DIAGNOSIS — F0390 Unspecified dementia without behavioral disturbance: Secondary | ICD-10-CM | POA: Diagnosis not present

## 2022-03-01 DIAGNOSIS — R4182 Altered mental status, unspecified: Secondary | ICD-10-CM | POA: Diagnosis not present

## 2022-03-01 DIAGNOSIS — E876 Hypokalemia: Secondary | ICD-10-CM | POA: Diagnosis not present

## 2022-03-01 DIAGNOSIS — R627 Adult failure to thrive: Secondary | ICD-10-CM | POA: Diagnosis not present

## 2022-03-01 LAB — CBC WITH DIFFERENTIAL/PLATELET
Abs Immature Granulocytes: 0.04 10*3/uL (ref 0.00–0.07)
Basophils Absolute: 0.1 10*3/uL (ref 0.0–0.1)
Basophils Relative: 1 %
Eosinophils Absolute: 0.2 10*3/uL (ref 0.0–0.5)
Eosinophils Relative: 4 %
HCT: 32.2 % — ABNORMAL LOW (ref 36.0–46.0)
Hemoglobin: 10.6 g/dL — ABNORMAL LOW (ref 12.0–15.0)
Immature Granulocytes: 1 %
Lymphocytes Relative: 41 %
Lymphs Abs: 2.3 10*3/uL (ref 0.7–4.0)
MCH: 29 pg (ref 26.0–34.0)
MCHC: 32.9 g/dL (ref 30.0–36.0)
MCV: 88.2 fL (ref 80.0–100.0)
Monocytes Absolute: 0.4 10*3/uL (ref 0.1–1.0)
Monocytes Relative: 8 %
Neutro Abs: 2.5 10*3/uL (ref 1.7–7.7)
Neutrophils Relative %: 45 %
Platelets: 286 10*3/uL (ref 150–400)
RBC: 3.65 MIL/uL — ABNORMAL LOW (ref 3.87–5.11)
RDW: 15.7 % — ABNORMAL HIGH (ref 11.5–15.5)
WBC: 5.6 10*3/uL (ref 4.0–10.5)
nRBC: 0 % (ref 0.0–0.2)

## 2022-03-01 LAB — COMPREHENSIVE METABOLIC PANEL
ALT: 13 U/L (ref 0–44)
AST: 23 U/L (ref 15–41)
Albumin: 2.6 g/dL — ABNORMAL LOW (ref 3.5–5.0)
Alkaline Phosphatase: 75 U/L (ref 38–126)
Anion gap: 8 (ref 5–15)
BUN: <5 mg/dL — ABNORMAL LOW (ref 8–23)
CO2: 28 mmol/L (ref 22–32)
Calcium: 8.4 mg/dL — ABNORMAL LOW (ref 8.9–10.3)
Chloride: 102 mmol/L (ref 98–111)
Creatinine, Ser: 0.89 mg/dL (ref 0.44–1.00)
GFR, Estimated: >60 mL/min (ref >60)
Glucose, Bld: 80 mg/dL (ref 70–99)
Potassium: 4.2 mmol/L (ref 3.5–5.1)
Sodium: 138 mmol/L (ref 135–145)
Total Bilirubin: 0.7 mg/dL (ref 0.3–1.2)
Total Protein: 5.8 g/dL — ABNORMAL LOW (ref 6.5–8.1)

## 2022-03-01 LAB — PHOSPHORUS: Phosphorus: 3.6 mg/dL (ref 2.5–4.6)

## 2022-03-01 LAB — MAGNESIUM: Magnesium: 2 mg/dL (ref 1.7–2.4)

## 2022-03-01 MED ORDER — ONDANSETRON HCL 4 MG/2ML IJ SOLN
4.0000 mg | Freq: Four times a day (QID) | INTRAMUSCULAR | Status: DC | PRN
Start: 2022-03-01 — End: 2022-03-06
  Administered 2022-03-01: 4 mg via INTRAVENOUS
  Filled 2022-03-01: qty 2

## 2022-03-01 MED ORDER — PANTOPRAZOLE SODIUM 40 MG IV SOLR
40.0000 mg | Freq: Two times a day (BID) | INTRAVENOUS | Status: DC
  Administered 2022-03-01 – 2022-03-06 (×11): 40 mg via INTRAVENOUS
  Filled 2022-03-01 (×11): qty 10

## 2022-03-01 NOTE — Progress Notes (Signed)
Patient disoriented x4.Very confused and easily agitated.Incontinent of bowel and bladder. Cleaned and changed with difficulties. Refused to eat breakfast and refused to take medications. ?

## 2022-03-01 NOTE — Progress Notes (Signed)
Patient ID: Monica Erickson, female   DOB: 1946/10/28, 76 y.o.   MRN: [id number] ? ? ? Progress Note ? ? Subjective  ? Day # 7 ? CC: Epigastric pain ? ?Labs-WBC 5.6/hemoglobin 10.6/hematocrit 32.2 ?Creatinine 0.89 ?LFTs within normal limits ?H. pylori stool antigen pending ? ?Anemia panel pending ? ?Patient was distressed, a bit tearful when I entered room, she seemed to be rubbing her abdomen.  She cannot give a good history but did answer yes to nausea, she won't say if she is having abdominal pain. ?Food at bedside untouched ?Refusing meds per nursing notes ? ? ? Objective  ? ?Vital signs in last 24 hours: ?Temp:  [97.8 ?F (36.6 ?C)-98.8 ?F (37.1 ?C)] 98.5 ?F (36.9 ?C) (04/22 0740) ?Pulse Rate:  [64-83] 64 (04/22 0740) ?Resp:  [17-22] 20 (04/22 0943) ?BP: (111-152)/(58-94) 145/94 (04/22 0943) ?SpO2:  [97 %-100 %] 100 % (04/22 0740) ?Last BM Date : 02/28/22 ?General:    Older African-American female in NAD ?Heart:  Regular rate and rhythm; no murmurs ?Lungs: Respirations even and unlabored, lungs CTA bilaterally ?Abdomen:  Soft, no real focal tenderness on exam nondistended. Normal bowel sounds. ?Extremities:  Without edema. ?Neurologic:  Alert , able to answer some simple questions ?Psych:  Cooperative.  ? ?Intake/Output from previous day: ?04/21 0701 - 04/22 0700 ?In: 1505.2 [I.V.:1505.2] ?Out: 400 [Urine:400] ?Intake/Output this shift: ?No intake/output data recorded. ? ?Lab Results: ?Recent Labs  ?  02/27/22 ?0143 02/28/22 ?0239 03/01/22 ?0229  ?WBC 5.9 5.8 5.6  ?HGB 11.7* 10.7* 10.6*  ?HCT 34.0* 31.7* 32.2*  ?PLT 307 195 286  ? ?BMET ?Recent Labs  ?  02/28/22 ?0239 02/28/22 ?1807 03/01/22 ?0229  ?NA 139 138 138  ?K 4.4 4.2 4.2  ?CL 105 103 102  ?CO2 25 25 28   ?GLUCOSE 93 82 80  ?BUN <5* <5* <5*  ?CREATININE 0.75 0.82 0.89  ?CALCIUM 8.5* 8.4* 8.4*  ? ?LFT ?Recent Labs  ?  03/01/22 ?0229  ?PROT 5.8*  ?ALBUMIN 2.6*  ?AST 23  ?ALT 13  ?ALKPHOS 75  ?BILITOT 0.7  ? ?PT/INR ?No results for input(s): LABPROT, INR in the  last 72 hours. ? ?Studies/Results: ?No results found. ? ? ? ? Assessment / Plan:   ? ?#36 76 year old African-American female with advanced dementia,, admitted 1 week ago with confusion, lethargy and apparent complaints of flank pain, diagnosed with UTI ? ?CT imaging of the abdomen and pelvis negative, chest showed prominent right hilum with concerns for possible PE and CT angio then confirmed extensive pulmonary emboli/pulmonary infarct with right heart strain ? ?On Lovenox ? ?#2 mild normocytic anemia ?#3 poor oral intake, not eating since admission and refusing meds-related to advanced dementia ?#4 history of gastritis, and duodenal bulb ulcer diagnosed at EGD May 2021-H. pylori positive-she was unable to complete a course of antibiotics, per notes as she was unable to or on cooperative in taking all of the pills. ? ?Concern for abdominal discomfort on exam yesterday ? ?She is unable to express if she is having any abdominal pain, did seem to be having some nausea today. ? ?She may have gastritis, or ulcer disease ? ?Plan continue twice daily PPI IV for now as she has not been taking pills ?Start Zofran 4 mg every 6 hours as needed for nausea ?Apparently H. pylori stool antigen was refused by lab due to being formed ?  Will reorder ? ?Consider upper GI though doubt she will drink contrast ?Is not a good candidate for endoscopic evaluation  presently, anticoagulated with massive PEs this admission. ? ?Treat empirically with PPI, antiemetics ?It has been multiple days since she has eaten much of anything, there will need to be further discussion with her family regarding goals of care. ? ? ?Principal Problem: ?  AMS (altered mental status) ?Active Problems: ?  Dementia without behavioral disturbance (HCC) ?  Poor appetite ?  Alzheimer's dementia (HCC) ?  Pulmonary embolism (HCC) ?  Failure to thrive in adult ?  Sepsis due to pneumonia [facility]) ?  Hypokalemia ?  Normocytic anemia ?  H. pylori infection ? ? ? ? LOS: 6  days  ? ?Jaquin Coy PA-C 03/01/2022, 11:23 AM ?  ?

## 2022-03-01 NOTE — Progress Notes (Signed)
Stool specimen sent to lab but was declined for being too formed ?

## 2022-03-01 NOTE — Progress Notes (Signed)
?PROGRESS NOTE ? ? ? ?Monica Erickson  RE:[id number] DOB: 10/28/1946 DOA: 02/22/2022 ?PCP: Vevelyn Francois, NP  ? ?Brief Narrative:  ?The patient Monica Erickson is a 76 y.o. female with medical history significant of dementia, chronic headaches, depression, gastric ulcer, H. pylori infection, nephrolithiasis, sleep apnea not on CPAP who presented to [facility] on 4/15 for evaluation of confusion, somnolence, and flank pain.  Patient is oriented to self only and not able to give any history. ?  ?Recently seen in the ED on 4/5 for dehydration, UTI, and possible H. pylori infection.  GI recommended outpatient follow-up. ?  ?In the ED, patient was found to be slightly tachycardic on arrival with low blood pressure in the 90s. Not febrile.  WBC 10.1.  BUN 15, creatinine 1.0.  T. bili 1.6, remainder of LFTs normal.  Lactic acid 2.5.  INR 1.3.  Blood cultures drawn.  UA pending.  Chest x-Saksa showing patchy airspace disease within the right mid and lower lung zones consistent with bronchopneumonia.  CT abdomen pelvis with contrast negative for renal calculus or obstructive uropathy.  Showing trace right pleural effusion with patchy airspace disease at the right lung base concerning for pneumonia.  Also showing prominent right hilum with a hypodense region in the right pulmonary artery only partially visualized on exam, possible PE versus prominent lymph nodes.  Dedicated CT angiogram chest recommended for further evaluation.  Patient was given fentanyl, IV heparin, ceftriaxone, azithromycin, and 2 L LR boluses.  Repeat lactate 1.8.  [facility] service was consulted for further evaluation and management of altered mental status/confusion with concern for pneumonia versus PE.  ? ?**Interim History ?She continues to have extremely poor p.o. intake has been refusing her medications orally.  Palliative Care has been consulted for goals of care discussion and patient had a family conference yesterday with palliative care and was a complicated  situation given that the patient is still not eating or drinking.  She does have a history of a gastric ulcer and has a history of H. pylori and palliative was concerned that there may be an element of stomach pain so they have initiated IV Protonix and given ondansetron and recommended touching base with GI.  She has been consulted for further evaluation to see if we could possibly help her p.o. intake and if she would benefit from treatment.  We will continue with palliative goals of care conversation but patient still not eating and she is very pleasantly demented still.  Patient's family believes that she may improve her oral intake when she is at home.  ? ? ?Assessment and Plan: ? ?Sepsis, POA and now resolved  ?Right-sided bronchopneumonia, improving  ?-Patient presenting with elevated WBC count of 10.2, chest x-Speedy with evidence of right-sided pneumonia.   ?-WBC has improved and is now 5.6 ?-Strep pneumo urinary antigen: Negative.   ?-Given patient's presenting symptoms of confusion greater than her typical baseline we will continue treatment for pneumonia x5 days. ?-Completed Azithromycin 5 mg IV q24h x 5 days and Ceftriaxone 2 g IV q24h x 5 days and now stopped ?-SpO2: 100 % ?-Respiratory Status appears stable  ?-Repeat CXR showed "Improving appearance of airspace process in the RIGHT lower lobe. No new areas of consolidation." ?  ?Bilateral pulmonary embolism with pulmonary infarct ?-CT angiogram chest with extensive chronic appearing pulmonary emboli within the distal right pulmonary artery extending to the right middle lobe and right lower lobar and segmental pulmonary arteries, segmental/subsegmental pulm emboli noted within the left lower  lobe which are age-indeterminate, CT evidence of right heart strain.   ?-Patient was initially started on heparin drip and now transition to Lovenox.  TTE with preserved LVEF, RV function within normal limits. ?-D-Dimer was 3.28  ?-Transitioned Lovenox to 1.5 mg/kg  once daily dosing for ease of administration by family (ON Enoxaparin 100 mg sq q24h) ?-TOC for benefit check for Lovenox; patient unlikely to comply with oral medications due to diversion and refusing oral intake ?  ?Acute metabolic encephalopathy ?Hx dementia ?-Patient presenting from home with confusion in the setting of history of advanced dementia.  Patient appears to be at baseline, but concern for worsening dementia/progression given her poor oral intake.   ?-Treating pneumonia/PE as above.  Vitamin B12 210, TSH 1.010, urinalysis unrevealing. ?-C/w Delirium precautions ?--Get up during the day ?-Encourage a familiar face to remain present throughout the day ?-Keep blinds open and lights on during daylight hours ?-Minimize the use of opioids/benzodiazepines ?-Continues to be significantly confused  ?  ?Hypokalemia ?-Repleted. ?-K+ went from 3.2 -> 3.9 -> 4.0 -> 4.4 -> 4.2 ?-Continue to Monitor and Replete as Necessary  ?-Repeat CMP in the AM  ?  ?AKI, improved ?Metabolic Acidosis  ?-Creatinine 1.06 on admission, supported with IV fluid hydration with improvement of creatinine to 0.67. ?-Now BUN/Cr is <5/0.75 -> <4/0.82 on last check  ?-Was initiated on D5 1/2 NS + 40 mEQ Kcl at 75 mL/hr given her poor po intake but was changed to Sodium Bicarbonate 150 mEQ in D5W at 75 mL/hr x 1 Day given her Metabolic Acidosis and now IVF has stopped  ?-Had a Metabolic Acidosis with a CO2 of 17, AG of 10, and Chloride of 106 and now with a CO2 of 25, chloride level of 103, and anion gap of 10 ?-Avoid nephrotoxic medications, contrast dyes, hypotension and dehydration renally dose medications ?-Repeat BMP/CMP in the a.m. ?  ?Hyponatremia ?-Patient's Na+ went from 137 -> 133 -> 139 -> 138 ?-Continue to Monitor and Trend ?-Continue to Monitor and Replete as Necessary ?-Repeat CMP in the AM  ?  ?Hypophosphatemia ?-Patient's Phos Level is now 3.6 ?-Continue to Monitor and Replete as Necessary ?-Repeat Phos Level in the AM  ?   ?History of gastritis and H. pylori ulcer ?-Was not treated as an outpatient and will initiate IV Pantoprazole 40 mg daily but change to IV PPI BID  ?-GI Consulted for further evaluation recommending continuing PPI twice daily and they are checking an H. pylori stool antigen and they are going to consider an upper GI and try and avoid endoscopic intervention if necessary ?-She does have a history of gastritis and linear duodenal bulb ulcer that was found in May 2021 and at that time she is H. pylori positive but did not complete treatment given that she is not able to swallow pills ?-She had abdominal pain since admission and CT of the abdomen pelvis was done and showed no acute findings and it is possible that she may have recurrent or persistent gastritis or ulcer disease per GIs evaluation ? ?Hyperbilirubinemia ?-Resolved.  T. bili is now 3.4 on last check ?-Continue to Monitor and Trend intermittently ?-Repeat CMP in the AM  ?  ?Hypomagnesemia ?-Patient's mag level is now 2.0 ?-Continue monitor and replete as necessary ?-Repeat mag level in a.m. ?  ?Normocytic Anemia ?-Likely Dilutional Drop from IVF as above  ?-Patient's hemoglobin/hematocrit went from 13.4/41.8 -> 12.0/35.7 -> 10.5/31.8 -> 10.9/31.3 -> 11.7/34.0 -> 10.7/31.7 and this morning it was 10.6/32.2 ?-  Check anemia panel in the a.m. ?-Continue monitor for signs and symptoms bleeding; no overt bleeding noted  ?-Repeat CBC in the ?  ?Adult failure to Thrive ?Hypoalbuminemia ?-Etiology likely secondary to progressive dementia.   ?-Albumin Level trend has gone from 2.7 -> 2.5 -> 2.6 ?-Family reports poor oral intake at baseline.  Has declined consideration for G-tube in the past.  Dr. [place] has discussed with patient's daughter, [name] regarding the progressive nature of dementia as she is likely nearing end-stage. ?-Palliative care consulted for assistance with goals of care/medical decision making and family conference done and and there is concern that  she may not be eating given her gastric ulcer and history of H. pylori so we will consult GI for further evaluation and recommendations ?-Initiated on IV pantoprazole 40 mg and will increase to BID PPI ?-GI f

## 2022-03-02 DIAGNOSIS — R4182 Altered mental status, unspecified: Secondary | ICD-10-CM | POA: Diagnosis not present

## 2022-03-02 DIAGNOSIS — D649 Anemia, unspecified: Secondary | ICD-10-CM | POA: Diagnosis not present

## 2022-03-02 DIAGNOSIS — Z7189 Other specified counseling: Secondary | ICD-10-CM | POA: Diagnosis not present

## 2022-03-02 DIAGNOSIS — G309 Alzheimer's disease, unspecified: Secondary | ICD-10-CM | POA: Diagnosis not present

## 2022-03-02 DIAGNOSIS — E876 Hypokalemia: Secondary | ICD-10-CM | POA: Diagnosis not present

## 2022-03-02 DIAGNOSIS — F0390 Unspecified dementia without behavioral disturbance: Secondary | ICD-10-CM | POA: Diagnosis not present

## 2022-03-02 DIAGNOSIS — R627 Adult failure to thrive: Secondary | ICD-10-CM | POA: Diagnosis not present

## 2022-03-02 DIAGNOSIS — R630 Anorexia: Secondary | ICD-10-CM | POA: Diagnosis not present

## 2022-03-02 LAB — RETICULOCYTES
Immature Retic Fract: 11.2 % (ref 2.3–15.9)
RBC.: 3.5 MIL/uL — ABNORMAL LOW (ref 3.87–5.11)
Retic Count, Absolute: 43.4 10*3/uL (ref 19.0–186.0)
Retic Ct Pct: 1.2 % (ref 0.4–3.1)

## 2022-03-02 LAB — CBC WITH DIFFERENTIAL/PLATELET
Abs Immature Granulocytes: 0.08 10*3/uL — ABNORMAL HIGH (ref 0.00–0.07)
Basophils Absolute: 0 10*3/uL (ref 0.0–0.1)
Basophils Relative: 1 %
Eosinophils Absolute: 0.2 10*3/uL (ref 0.0–0.5)
Eosinophils Relative: 4 %
HCT: 30.9 % — ABNORMAL LOW (ref 36.0–46.0)
Hemoglobin: 10.3 g/dL — ABNORMAL LOW (ref 12.0–15.0)
Immature Granulocytes: 2 %
Lymphocytes Relative: 36 %
Lymphs Abs: 2 10*3/uL (ref 0.7–4.0)
MCH: 29.2 pg (ref 26.0–34.0)
MCHC: 33.3 g/dL (ref 30.0–36.0)
MCV: 87.5 fL (ref 80.0–100.0)
Monocytes Absolute: 0.4 10*3/uL (ref 0.1–1.0)
Monocytes Relative: 8 %
Neutro Abs: 2.8 10*3/uL (ref 1.7–7.7)
Neutrophils Relative %: 49 %
Platelets: 260 10*3/uL (ref 150–400)
RBC: 3.53 MIL/uL — ABNORMAL LOW (ref 3.87–5.11)
RDW: 15.6 % — ABNORMAL HIGH (ref 11.5–15.5)
Smear Review: ADEQUATE
WBC: 5.5 10*3/uL (ref 4.0–10.5)
nRBC: 0 % (ref 0.0–0.2)

## 2022-03-02 LAB — IRON AND TIBC
Iron: 37 ug/dL (ref 28–170)
Saturation Ratios: 18 % (ref 10.4–31.8)
TIBC: 202 ug/dL — ABNORMAL LOW (ref 250–450)
UIBC: 165 ug/dL

## 2022-03-02 LAB — FOLATE: Folate: 11.6 ng/mL (ref >5.9)

## 2022-03-02 LAB — BASIC METABOLIC PANEL
Anion gap: 12 (ref 5–15)
BUN: <5 mg/dL — ABNORMAL LOW (ref 8–23)
CO2: 21 mmol/L — ABNORMAL LOW (ref 22–32)
Calcium: 8.8 mg/dL — ABNORMAL LOW (ref 8.9–10.3)
Chloride: 104 mmol/L (ref 98–111)
Creatinine, Ser: 1 mg/dL (ref 0.44–1.00)
GFR, Estimated: 59 mL/min — ABNORMAL LOW (ref >60)
Glucose, Bld: 71 mg/dL (ref 70–99)
Potassium: 4.1 mmol/L (ref 3.5–5.1)
Sodium: 137 mmol/L (ref 135–145)

## 2022-03-02 LAB — VITAMIN B12: Vitamin B-12: 649 pg/mL (ref 180–914)

## 2022-03-02 LAB — MAGNESIUM: Magnesium: 2 mg/dL (ref 1.7–2.4)

## 2022-03-02 LAB — PHOSPHORUS: Phosphorus: 3.7 mg/dL (ref 2.5–4.6)

## 2022-03-02 LAB — FERRITIN: Ferritin: 254 ng/mL (ref 11–307)

## 2022-03-02 NOTE — Progress Notes (Signed)
?PROGRESS NOTE ? ? ? ?Monica Erickson  UJW:[id number] DOB: 08/07/1946 DOA: 02/22/2022 ?PCP: Barbette MerinoKing, Crystal M, NP  ? ?Brief Narrative:  ?The patient Monica Erickson is a 76 y.o. female with medical history significant of dementia, chronic headaches, depression, gastric ulcer, H. pylori infection, nephrolithiasis, sleep apnea not on CPAP who [facility] on 4/15 for evaluation of confusion, somnolence, and flank pain.  Patient is oriented to self only and not able to give any history. ?  ?Recently seen in the ED on 4/5 for dehydration, UTI, and possible H. pylori infection.  GI recommended outpatient follow-up. ?  ?In the ED, patient was found to be slightly tachycardic on arrival with low blood pressure in the 90s. Not febrile.  WBC 10.1.  BUN 15, creatinine 1.0.  T. bili 1.6, remainder of LFTs normal.  Lactic acid 2.5.  INR 1.3.  Blood cultures drawn.  UA pending.  Chest x-Smisek showing patchy airspace disease within the right mid and lower lung zones consistent with bronchopneumonia.  CT abdomen pelvis with contrast negative for renal calculus or obstructive uropathy.  Showing trace right pleural effusion with patchy airspace disease at the right lung base concerning for pneumonia.  Also showing prominent right hilum with a hypodense region in the right pulmonary artery only partially visualized on exam, possible PE versus prominent lymph nodes.  Dedicated CT angiogram chest recommended for further evaluation.  Patient was given fentanyl, IV heparin, ceftriaxone, azithromycin, and 2 L LR boluses.  Repeat lactate 1.8.  Hospital service was consulted for further evaluation and management of altered mental status/confusion with concern for pneumonia versus PE.  ? ?**Interim History ?She continues to have extremely poor p.o. intake has been refusing her medications orally.  Palliative Care has been consulted for goals of care discussion and patient had a family conference yesterday with palliative care and was a complicated  situation given that the patient is still not eating or drinking.  She does have a history of a gastric ulcer and has a history of H. pylori and palliative was concerned that there may be an element of stomach pain so they have initiated IV Protonix and given ondansetron and recommended touching base with GI.  She has been consulted for further evaluation to see if we could possibly help her p.o. intake and if she would benefit from treatment.  We will continue with palliative goals of care conversation but patient still not eating and she is very pleasantly demented still.  Patient's family believes that she may improve her oral intake when she is at home. ? ?Patient still not eating anything and refused part of her labs. Continues to be significantly confused and has been intermittently hallucinating. GI ordering Upper GI Series this AM. She is not felt to be a good Endoscopic candidate given PE's. Palliative to follow up on GOC conversations today.   ? ? ?Assessment and Plan: ? ?Sepsis, POA and now resolved  ?Right-sided bronchopneumonia, improving  ?-Patient presenting with elevated WBC count of 10.2, chest x-[place] with evidence of right-sided pneumonia.   ?-WBC has improved and is now 5.5 ?-Strep pneumo urinary antigen: Negative.   ?-Given patient's presenting symptoms of confusion greater than her typical baseline we will continue treatment for pneumonia x5 days. ?-Completed Azithromycin 5 mg IV q24h x 5 days and Ceftriaxone 2 g IV q24h x 5 days and now stopped ?-SpO2: 100 % ?-Respiratory Status appears stable  ?-Repeat CXR showed "Improving appearance of airspace process in the RIGHT lower lobe.  No new areas of consolidation." ?  ?Bilateral pulmonary embolism with pulmonary infarct ?-CT angiogram chest with extensive chronic appearing pulmonary emboli within the distal right pulmonary artery extending to the right middle lobe and right lower lobar and segmental pulmonary arteries, segmental/subsegmental pulm  emboli noted within the left lower lobe which are age-indeterminate, CT evidence of right heart strain.   ?-Patient was initially started on heparin drip and now transition to Lovenox.  TTE with preserved LVEF, RV function within normal limits. ?-D-Dimer was 3.28  ?-Transitioned Lovenox to 1.5 mg/kg once daily dosing for ease of administration by family (ON Enoxaparin 100 mg sq q24h) ?-TOC for benefit check for Lovenox; patient unlikely to comply with oral medications due to diversion and refusing oral intake ?  ?Acute Metabolic Encephalopathy ?Hx Dementia ?-Patient presenting from home with confusion in the setting of history of advanced dementia.  Patient appears to be at baseline, but concern for worsening dementia/progression given her poor oral intake.   ?-Now hallucinating some ?-Treating pneumonia/PE as above.  Vitamin B12 210, TSH 1.010, urinalysis unrevealing. ?-C/w Delirium precautions ?-Get up during the day ?-Encourage a familiar face to remain present throughout the day ?-Keep blinds open and lights on during daylight hours ?-Minimize the use of opioids/benzodiazepines ?-Continues to be significantly confused  ?  ?Hypokalemia ?-Repleted. ?-K+ went from 3.2 -> 3.9 -> 4.0 -> 4.4 -> 4.2 -> 4.1 ?-Continue to Monitor and Replete as Necessary  ?-Repeat CMP in the AM  ?  ?AKI, improved ?Metabolic Acidosis  ?-Creatinine 1.06 on admission, supported with IV fluid hydration with improvement of creatinine to 0.67. ?-Now BUN/Cr is <5/1.00 ?-Was initiated on D5 1/2 NS + 40 mEQ Kcl at 75 mL/hr given her poor po intake but was changed to Sodium Bicarbonate 150 mEQ in D5W at 75 mL/hr x 1 Day given her Metabolic Acidosis and now IVF has stopped  ?-Had a Metabolic Acidosis with a CO2 of 17, AG of 10, and Chloride of 106 and now with a CO2 of 21, chloride level of 104, and anion gap of 12 ?-Avoid nephrotoxic medications, contrast dyes, hypotension and dehydration renally dose medications ?-Repeat BMP/CMP in the a.m. ?   ?Hyponatremia ?-Patient's Na+ went from 137 -> 133 -> 139 -> 138 -> 137 ?-Continue to Monitor and Trend ?-Continue to Monitor and Replete as Necessary ?-Repeat CMP in the AM  ?  ?Hypophosphatemia ?-Patient's Phos Level is now 3.7 ?-Continue to Monitor and Replete as Necessary ?-Repeat Phos Level in the AM  ?  ?History of gastritis and H. pylori ulcer ?-Was not treated as an outpatient and will initiate IV Pantoprazole 40 mg daily but changed to IV PPI BID given her poor oral intake ?-Patient's Zofran is PRN Nausea  ?-GI Consulted for further evaluation recommending continuing PPI twice daily and they are checking an H. pylori stool antigen and they are ordering an upper GI Series to clarify status of PUD and try and avoid endoscopic intervention if necessary ?-She does have a history of gastritis and linear duodenal bulb ulcer that was found in May 2021 and at that time she is H. pylori positive but did not complete treatment given that she is not able to swallow pills ?-She had abdominal pain since admission and CT of the abdomen pelvis was done and showed no acute findings and it is possible that she may have recurrent or persistent gastritis or ulcer disease per GIs evaluation ?  ?Hyperbilirubinemia ?-Resolved.  T. bili is now 0.7 on last  check ?-Continue to Monitor and Trend intermittently ?-Repeat CMP in the AM  ?  ?Hypomagnesemia ?-Patient's mag level is now 2.0 ?-Continue monitor and replete as necessary ?-Repeat mag level in a.m. ?  ?Normocytic Anemia ?-Likely Dilutional Drop from IVF as above  ?-Patient's hemoglobin/hematocrit went from 13.4/41.8 -> 12.0/35.7 -> 10.5/31.8 -> 10.9/31.3 -> 11.7/34.0 -> 10.7/31.7 -> 10.6/32.2 -> 10.3/30.9 ?-Check Anemia Panel and it is pending  ?-Continue monitor for signs and symptoms bleeding; no overt bleeding noted  ?-Repeat CBC in the ?  ?Adult failure to Thrive ?Hypoalbuminemia ?-Etiology likely secondary to progressive dementia.   ?-Albumin Level trend has gone from  2.7 -> 2.5 -> 2.6 on last check ?-Family reports poor oral intake at baseline.  Has declined consideration for G-tube in the past.  Dr. [place] has discussed with patient's daughter, [name] regarding the progres

## 2022-03-02 NOTE — Progress Notes (Signed)
? ?                                                                                                                                                     ?                                                   ?Daily Progress Note  ? ?Patient Name: Monica Erickson       Date: 03/02/2022 ?DOB: July 16, 1946  Age: 76 y.o. MRN#: [id number] ?Attending Physician: Merlene Laughter, DO ?Primary Care Physician: Monica Merino, NP ?Admit Date: 02/22/2022 ? ?Reason for Consultation/Follow-up: Establishing goals of care ? ?Patient Profile/HPI:   76 y.o. female  with past medical history of dementia, depression, kidney stones, sleep apnea (does not use CPAP), chronic headaches, H. Pylori ulcer (untreated due to not able to swallow treatment, however, started on liquid antibiotics on 4/4 by ED visit to treat H. Pylori and UTI), diverticulosis,  admitted on 02/22/2022 with increasing confusion, R flank pain. Workup revealed sepsis, pneumonia, acute kidney injury. She also has a chronic PE and has been started on lovenox. She is declining to eat this admission and declining oral medications. Palliative medicine consulted for GOC.  ? ?Subjective: ?Chart reviewed- including labs, imaging and progress notes.  ?Monica Erickson continues to have poor po intake- she only drank orange juice this morning. She is refusing medications and labs. GI is consulting. Plan for upper gi series- although she may not drink the contrast.  ?I evaluated [name]. Pleasantly confused. Not talking as much. Unintelligible words. She is incontinent of stool and urine.  ?Attempted to call Monica Erickson- Monica Erickson stated she was in bad reception area and would call back.  ? ?Review of Systems  ?Unable to perform ROS: Dementia  ? ? ?Physical Exam ?Vitals and nursing note reviewed.  ?Cardiovascular:  ?   Rate and Rhythm: Normal rate.  ?Abdominal:  ?   Tenderness: There is abdominal tenderness.  ?Neurological:  ?   Mental Status: She is alert. She is disoriented.  ?         ? ?Vital Signs: BP 110/64  (BP Location: Left Arm)   Pulse 65   Temp 98.7 ?F (37.1 ?C) (Oral)   Resp 17   Ht 5\' 8"  (1.727 m)   Wt 65.1 kg   SpO2 100%   BMI 21.82 kg/m?  ?SpO2: SpO2: 100 % ?O2 Device: O2 Device: Room Air ?O2 Flow Rate:   ? ?Intake/output summary:  ?Intake/Output Summary (Last 24 hours) at 03/02/2022 1238 ?Last data filed at 03/01/2022 1825 ?Gross per 24 hour  ?Intake --  ?Output 300 ml  ?Net -300 ml  ? ? ?LBM: Last  BM Date : 03/01/22 ?Baseline Weight: Weight: 65.1 kg ?Most recent weight: Weight: 65.1 kg ? ?     ?Palliative Assessment/Data: PPS: 10% ? ? ? ? ? ?Patient Active Problem List  ? Diagnosis Date Noted  ? Normocytic anemia   ? H. pylori infection   ? Pulmonary embolism (HCC) 02/25/2022  ? Failure to thrive in adult 02/25/2022  ? Sepsis due to pneumonia (HCC) 02/25/2022  ? Hypokalemia 02/25/2022  ? AMS (altered mental status) 02/23/2022  ? Pseudophakia of both eyes 02/26/2021  ? Alzheimer's dementia (HCC) 09/24/2020  ? Generalized abdominal pain 02/17/2020  ? Poor appetite 02/17/2020  ? Weight loss 02/17/2020  ? Constipation 02/17/2020  ? Other hyperlipidemia 02/17/2020  ? Belching 02/17/2020  ? Stone, kidney 12/07/2018  ? Dementia without behavioral disturbance (HCC) 12/07/2018  ? ? ?Palliative Care Assessment & Plan  ? ? ?Assessment/Recommendations/Plan ? ?Continues not to eat or drink ?Plan to discuss with Monica Erickson further GOC- patient not improving- if unable to do EGD may have reached maximum attempts at finding reversible causes for not eating and possibly facing end of life ?Attempted to call Monica Erickson- Monica Erickson stated she would call me back ? ? ?Code Status: ?Full code ? ?Prognosis: ? Unable to determine ? ?Discharge Planning: ?Home with Palliative Services vs home with hospice ? ? ? ?Ocie Bob, AGNP-C ?Palliative Medicine ? ? ?Please contact Palliative Medicine Team phone at [phone] for questions and concerns.  ? ? ? ? ? ? ?

## 2022-03-02 NOTE — Progress Notes (Signed)
Patient ID: Monica Erickson, female   DOB: 01-01-1946, 76 y.o.   MRN: [id number] ? ? ? Progress Note ? ? Subjective  ? Day # 7 ?CC; upper abdominal pain/advanced dementia, bilateral PE ? ?Patient pleasant this morning, full tray of food in front of her -has not eaten anything, denies abdominal pain or nausea today, patient had been speaking with someone who is not in the room(hallucinating) ? ?WBC 5.5/hemoglobin 10.3/hematocrit 30.9 potassium 4.1 ?BUN 5/creatinine 1.0 ? ?H. pylori stool antigen pending ? ? Objective  ? ?Vital signs in last 24 hours: ?Temp:  [98.3 ?F (36.8 ?C)-98.7 ?F (37.1 ?C)] 98.7 ?F (37.1 ?C) (04/23 7939) ?Pulse Rate:  [63-82] 63 (04/23 0839) ?Resp:  [17-21] 17 (04/23 0839) ?BP: (110-144)/(65-88) 110/65 (04/23 0839) ?SpO2:  [97 %-100 %] 100 % (04/23 0839) ?Last BM Date : 03/01/22 ?General:    Elderly African-American  female in NAD ?Heart:  Regular rate and rhythm; no murmurs ?Lungs: Respirations even and unlabored, lungs CTA bilaterally ?Abdomen:  Soft, no focal tenderness nondistended. Normal bowel sounds. ?Extremities:  Without edema. ?Neurologic:  Alert, disoriented, had been hallucinating when I walked into the room ?Psych:  Cooperative.  ? ?Intake/Output from previous day: ?04/22 0701 - 04/23 0700 ?In: -  ?Out: 300 [Urine:300] ?Intake/Output this shift: ?No intake/output data recorded. ? ?Lab Results: ?Recent Labs  ?  02/28/22 ?0239 03/01/22 ?0229 03/02/22 ?0405  ?WBC 5.8 5.6 5.5  ?HGB 10.7* 10.6* 10.3*  ?HCT 31.7* 32.2* 30.9*  ?PLT 195 286 260  ? ?BMET ?Recent Labs  ?  02/28/22 ?1807 03/01/22 ?0229 03/02/22 ?0405  ?NA 138 138 137  ?K 4.2 4.2 4.1  ?CL 103 102 104  ?CO2 25 28 21*  ?GLUCOSE 82 80 71  ?BUN <5* <5* <5*  ?CREATININE 0.82 0.89 1.00  ?CALCIUM 8.4* 8.4* 8.8*  ? ?LFT ?Recent Labs  ?  03/01/22 ?0229  ?PROT 5.8*  ?ALBUMIN 2.6*  ?AST 23  ?ALT 13  ?ALKPHOS 75  ?BILITOT 0.7  ? ?PT/INR ?No results for input(s): LABPROT, INR in the last 72 hours. ? ?Studies/Results: ?No results found. ? ? ? ?  Assessment / Plan:   ? ?#84 75 year old African-American female with advanced dementia, admitted a week ago with confusion, lethargy and apparent complaints of flank pain and diagnosed with UTI. ?Imaging of the abdomen pelvis negative ?CT of the chest showed prominent right hilum and concerns for possible PE ?CT angio confirmed extensive pulmonary emboli/pulmonary infarct with right heart strain ? ?On Lovenox ? ?#2 normocytic anemia ?#3 very poor oral intake, she has not been eating since admission and frequently refusing medications-likely secondary to the advanced dementia ?Expect she is at the point where she will need to be fed if possible ?Family has expressed desire for no PEG tube ? ?#4 history of gastritis and duodenal bulb ulcer EGD 03/2020, was H. pylori positive unable to complete a course of treatment as she was unable to take the pills ? ?There was concern for abdominal discomfort on exam over the past couple of days, patient is completely unable to say whether she is hurting or nauseated ? ?Plan; spoke with hospitalist this morning, will order upper GI-no unlikely she will agree to swallow the barium ?Not a good endoscopic candidate with submassive PEs this admission, need for anticoagulation ? ?Continuing IV PPI for now as she has not been taking her oral medication ?Continue Zofran 4 mg every 6 hours as needed ?Await H. pylori stool antigen however we still may not be able  to complete her course of treatment as the regimen involves multiple pills over a 2-week course ? ?Palliative care to see her today, family had expressed no PEG tube, goals of care need to be established ? ?GI  will follow-up on upper GI in a.m., then plan to sign off as treatment will be empiric  ? ? ? ? ?Principal Problem: ?  AMS (altered mental status) ?Active Problems: ?  Dementia without behavioral disturbance (HCC) ?  Poor appetite ?  Alzheimer's dementia (HCC) ?  Pulmonary embolism (HCC) ?  Failure to thrive in adult ?  Sepsis  due to pneumonia [facility]) ?  Hypokalemia ?  Normocytic anemia ?  H. pylori infection ? ? ? ? LOS: 7 days  ? ?Tovia Kisner PA-C 03/02/2022, 10:10 AM ?  ?

## 2022-03-02 NOTE — Progress Notes (Signed)
Phlebotomist at bedside to draw blood. Patient refusing blood draws, states ok when phlebotomist asks is it ok to proceed, but withdraws hand as needle is inserted.  ?When this RN questions patient, she does not respond to questions asked.  ? ?Page sent to Dr. Marland Mcalpine to notify of refusal for lab draw. Awaiting response.  ?

## 2022-03-03 ENCOUNTER — Inpatient Hospital Stay (HOSPITAL_COMMUNITY): Payer: Medicare Other

## 2022-03-03 DIAGNOSIS — F0390 Unspecified dementia without behavioral disturbance: Secondary | ICD-10-CM | POA: Diagnosis not present

## 2022-03-03 DIAGNOSIS — R627 Adult failure to thrive: Secondary | ICD-10-CM | POA: Diagnosis not present

## 2022-03-03 DIAGNOSIS — A048 Other specified bacterial intestinal infections: Secondary | ICD-10-CM | POA: Diagnosis not present

## 2022-03-03 DIAGNOSIS — Z7189 Other specified counseling: Secondary | ICD-10-CM | POA: Diagnosis not present

## 2022-03-03 DIAGNOSIS — R4182 Altered mental status, unspecified: Secondary | ICD-10-CM | POA: Diagnosis not present

## 2022-03-03 DIAGNOSIS — E876 Hypokalemia: Secondary | ICD-10-CM | POA: Diagnosis not present

## 2022-03-03 LAB — CBC WITH DIFFERENTIAL/PLATELET
Abs Immature Granulocytes: 0.05 10*3/uL (ref 0.00–0.07)
Basophils Absolute: 0.1 10*3/uL (ref 0.0–0.1)
Basophils Relative: 1 %
Eosinophils Absolute: 0.2 10*3/uL (ref 0.0–0.5)
Eosinophils Relative: 4 %
HCT: 33.8 % — ABNORMAL LOW (ref 36.0–46.0)
Hemoglobin: 11.1 g/dL — ABNORMAL LOW (ref 12.0–15.0)
Immature Granulocytes: 1 %
Lymphocytes Relative: 49 %
Lymphs Abs: 2.4 10*3/uL (ref 0.7–4.0)
MCH: 29 pg (ref 26.0–34.0)
MCHC: 32.8 g/dL (ref 30.0–36.0)
MCV: 88.3 fL (ref 80.0–100.0)
Monocytes Absolute: 0.3 10*3/uL (ref 0.1–1.0)
Monocytes Relative: 7 %
Neutro Abs: 1.8 10*3/uL (ref 1.7–7.7)
Neutrophils Relative %: 38 %
Platelets: 270 10*3/uL (ref 150–400)
RBC: 3.83 MIL/uL — ABNORMAL LOW (ref 3.87–5.11)
RDW: 15.6 % — ABNORMAL HIGH (ref 11.5–15.5)
WBC: 4.8 10*3/uL (ref 4.0–10.5)
nRBC: 0 % (ref 0.0–0.2)

## 2022-03-03 LAB — COMPREHENSIVE METABOLIC PANEL
ALT: 15 U/L (ref 0–44)
AST: 24 U/L (ref 15–41)
Albumin: 2.9 g/dL — ABNORMAL LOW (ref 3.5–5.0)
Alkaline Phosphatase: 69 U/L (ref 38–126)
Anion gap: 9 (ref 5–15)
BUN: 6 mg/dL — ABNORMAL LOW (ref 8–23)
CO2: 22 mmol/L (ref 22–32)
Calcium: 8.8 mg/dL — ABNORMAL LOW (ref 8.9–10.3)
Chloride: 106 mmol/L (ref 98–111)
Creatinine, Ser: 1.05 mg/dL — ABNORMAL HIGH (ref 0.44–1.00)
GFR, Estimated: 56 mL/min — ABNORMAL LOW (ref >60)
Glucose, Bld: 75 mg/dL (ref 70–99)
Potassium: 4 mmol/L (ref 3.5–5.1)
Sodium: 137 mmol/L (ref 135–145)
Total Bilirubin: 0.8 mg/dL (ref 0.3–1.2)
Total Protein: 6.1 g/dL — ABNORMAL LOW (ref 6.5–8.1)

## 2022-03-03 LAB — PHOSPHORUS: Phosphorus: 3.6 mg/dL (ref 2.5–4.6)

## 2022-03-03 LAB — MAGNESIUM: Magnesium: 2 mg/dL (ref 1.7–2.4)

## 2022-03-03 MED ORDER — KCL IN DEXTROSE-NACL 20-5-0.9 MEQ/L-%-% IV SOLN
INTRAVENOUS | Status: DC
  Filled 2022-03-03 (×4): qty 1000

## 2022-03-03 NOTE — Progress Notes (Signed)
? ?                                                                                                                                                     ?                                                   ?Daily Progress Note  ? ?Patient Name: Monica Erickson       Date: 03/03/2022 ?DOB: 08-23-1946  Age: 76 y.o. MRN#: [id number] ?Attending Physician: Kerney Elbe, DO ?Primary Care Physician: Vevelyn Francois, NP ?Admit Date: 02/22/2022 ? ?Reason for Consultation/Follow-up: Establishing goals of care ? ?Patient Profile/HPI:   76 y.o. female  with past medical history of dementia, depression, kidney stones, sleep apnea (does not use CPAP), chronic headaches, H. Pylori ulcer (untreated due to not able to swallow treatment, however, started on liquid antibiotics on 4/4 by ED visit to treat H. Pylori and UTI), diverticulosis,  admitted on 02/22/2022 with increasing confusion, R flank pain. Workup revealed sepsis, pneumonia, acute kidney injury. She also has a chronic PE and has been started on lovenox. She is declining to eat this admission and declining oral medications. Palliative medicine consulted for [place].  ? ?Subjective: ?Chart reviewed- including labs, imaging and progress notes. Noted did note drink contrast for GI series.  ?[name] is awake and alert, no intelligible speech today.  ?Attempted to call patient's daughter [name] for followup.  ? ?Review of Systems  ?Unable to perform ROS: Dementia  ? ? ?Physical Exam ?Vitals and nursing note reviewed.  ?Cardiovascular:  ?   Rate and Rhythm: Normal rate.  ?Abdominal:  ?   Tenderness: There is abdominal tenderness.  ?Neurological:  ?   Mental Status: She is alert. She is disoriented.  ?         ? ?Vital Signs: BP 110/72 (BP Location: Right Arm)   Pulse 68   Temp 98.4 ?F (36.9 ?C) (Oral)   Resp 20   Ht 5\' 8"  (1.727 m)   Wt 65.1 kg   SpO2 99%   BMI 21.82 kg/m?  ?SpO2: SpO2: 99 % ?O2 Device: O2 Device: Room Air ?O2 Flow Rate:   ? ?Intake/output summary:  ?Intake/Output  Summary (Last 24 hours) at 03/03/2022 1213 ?Last data filed at 03/02/2022 1300 ?Gross per 24 hour  ?Intake 120 ml  ?Output --  ?Net 120 ml  ? ? ?LBM: Last BM Date : 03/02/22 ?Baseline Weight: Weight: 65.1 kg ?Most recent weight: Weight: 65.1 kg ? ?     ?Palliative Assessment/Data: PPS: 10% ? ? ? ? ? ?Patient Active Problem List  ? Diagnosis Date Noted  ? Normocytic anemia   ?  H. pylori infection   ? Pulmonary embolism ([place]) 02/25/2022  ? Failure to thrive in adult 02/25/2022  ? Sepsis due to pneumonia ([place]) 02/25/2022  ? Hypokalemia 02/25/2022  ? AMS (altered mental status) 02/23/2022  ? Pseudophakia of both eyes 02/26/2021  ? Alzheimer's dementia ([place]) 09/24/2020  ? Generalized abdominal pain 02/17/2020  ? Poor appetite 02/17/2020  ? Weight loss 02/17/2020  ? Constipation 02/17/2020  ? Other hyperlipidemia 02/17/2020  ? Belching 02/17/2020  ? Stone, kidney 12/07/2018  ? Dementia without behavioral disturbance ([place]) 12/07/2018  ? ? ?Palliative Care Assessment & Plan  ? ? ?Assessment/Recommendations/Plan ? ?Continues not to eat or drink ?Plan to discuss with [name] further GOC- no call back yesterday from [name]- called again today- left message requesting return call.  ? ?Code Status: ?Full code ? ?Prognosis: ? Unable to determine ? ?Discharge Planning: ?Home with Palliative Services vs home with hospice ? ? ? ?Mariana Kaufman, AGNP-C ?Palliative Medicine ? ? ?Please contact Palliative Medicine Team phone at [phone] for questions and concerns.  ? ? ? ? ? ? ?

## 2022-03-03 NOTE — Progress Notes (Signed)
Physical Therapy Treatment ?Patient Details ?Name: Monica Erickson ?MRN: [id number] ?DOB: Aug 06, 1946 ?Today's Date: 03/03/2022 ? ? ?History of Present Illness 76 y.o. female admitted 4/15 with sepsis secondary to CAP, acute metabolic encephalopathy, possible PE, anorexia.  Recently seen in the ED on 4/5 for dehydration, UTI, and possible H. pylori infection. Prior medical history significant of dementia, chronic headaches, depression, gastric ulcer, H. pylori infection, nephrolithiasis, sleep apnea not on CPAP. ? ?  ?PT Comments  ? ? Pt making steady progress with mobility. Will need supervision for cognition.    ?Recommendations for follow up therapy are one component of a multi-disciplinary discharge planning process, led by the attending physician.  Recommendations may be updated based on patient status, additional functional criteria and insurance authorization. ? ?Follow Up Recommendations ? Home health PT ?  ?  ?Assistance Recommended at Discharge Frequent or constant Supervision/Assistance  ?Patient can return home with the following A little help with bathing/dressing/bathroom;Assistance with cooking/housework;Direct supervision/assist for medications management;Direct supervision/assist for financial management;Assist for transportation;A little help with walking and/or transfers ?  ?Equipment Recommendations ? None recommended by PT  ?  ?Recommendations for Other Services   ? ? ?  ?Precautions / Restrictions Precautions ?Precautions: Fall ?Restrictions ?Weight Bearing Restrictions: No  ?  ? ?Mobility ? Bed Mobility ?Overal bed mobility: Needs Assistance ?Bed Mobility: Supine to Sit, Sit to Supine ?  ?  ?Supine to sit: Mod assist ?Sit to supine: Min assist ?  ?General bed mobility comments: Mod assist to initiate coming to sit. Once initiated min guard to complete. Min assist to initiate lying back down. ?  ? ?Transfers ?Overall transfer level: Needs assistance ?Equipment used: None ?Transfers: Sit to/from  Stand ?Sit to Stand: Min guard ?  ?  ?  ?  ?  ?General transfer comment: Assist for safety ?  ? ?Ambulation/Gait ?Ambulation/Gait assistance: Min guard ?Gait Distance (Feet): 200 Feet ?Assistive device: None ?Gait Pattern/deviations: Step-through pattern, Decreased stride length ?Gait velocity: decr ?Gait velocity interpretation: 1.31 - 2.62 ft/sec, indicative of limited community ambulator ?  ?General Gait Details: Assist for safety. No overt instability ? ? ?Stairs ?  ?  ?  ?  ?  ? ? ?Wheelchair Mobility ?  ? ?Modified Rankin (Stroke Patients Only) ?  ? ? ?  ?Balance Overall balance assessment: Needs assistance ?Sitting-balance support: Feet supported, No upper extremity supported ?Sitting balance-Leahy Scale: Fair ?  ?  ?Standing balance support: No upper extremity supported ?Standing balance-Leahy Scale: Fair ?  ?  ?  ?  ?  ?  ?  ?  ?  ?  ?  ?  ?  ? ?  ?Cognition Arousal/Alertness: Awake/alert ?Behavior During Therapy: [facility] for tasks assessed/performed ?Overall Cognitive Status: History of cognitive impairments - at baseline ?  ?  ?  ?  ?  ?  ?  ?  ?  ?  ?  ?  ?  ?  ?  ?  ?General Comments: Decr initiation and needs tactile cues to initiate at times. ?  ?  ? ?  ?Exercises   ? ?  ?General Comments   ?  ?  ? ?Pertinent Vitals/Pain Pain Assessment ?Pain Assessment: PAINAD ?Breathing: normal ?Negative Vocalization: none ?Facial Expression: smiling or inexpressive ?Body Language: relaxed ?Consolability: no need to console ?PAINAD Score: 0  ? ? ?Home Living   ?  ?  ?  ?  ?  ?  ?  ?  ?  ?   ?  ?Prior Function    ?  ?  ?   ? ?  PT Goals (current goals can now be found in the care plan section) Progress towards PT goals: Progressing toward goals ? ?  ?Frequency ? ? ? Min 3X/week ? ? ? ?  ?PT Plan Current plan remains appropriate  ? ? ?Co-evaluation   ?  ?  ?  ?  ? ?  ?AM-PAC PT "6 Clicks" Mobility   ?Outcome Measure ? Help needed turning from your back to your side while in a flat bed without using bedrails?: A Little ?Help  needed moving from lying on your back to sitting on the side of a flat bed without using bedrails?: A Little ?Help needed moving to and from a bed to a chair (including a wheelchair)?: A Little ?Help needed standing up from a chair using your arms (e.g., wheelchair or bedside chair)?: A Little ?Help needed to walk in hospital room?: A Little ?Help needed climbing 3-5 steps with a railing? : A Little ?6 Click Score: 18 ? ?  ?End of Session Equipment Utilized During Treatment: Gait belt ?Activity Tolerance: Patient tolerated treatment well ?Patient left: in bed;with call bell/phone within reach;with bed alarm set ?Nurse Communication: Mobility status ?PT Visit Diagnosis: Unsteadiness on feet (R26.81) ?  ? ? ?Time: 8416-6063 ?PT Time Calculation (min) (ACUTE ONLY): 14 min ? ?Charges:  $Gait Training: 8-22 mins          ?          ? ?[facility] PT ?Acute Rehabilitation Services ?Office [phone] ? ? ? ?Angelina Ok [facility] ?03/03/2022, 1:53 PM ? ?

## 2022-03-03 NOTE — Plan of Care (Signed)

## 2022-03-03 NOTE — Progress Notes (Signed)
?PROGRESS NOTE ? ? ? ?Monica Erickson  JJK:[id number] DOB: 10-06-1946 DOA: 02/22/2022 ?PCP: [name], NP  ? ?Brief Narrative:  ?The patient Monica Erickson is a 76 y.o. female with medical history significant of dementia, chronic headaches, depression, gastric ulcer, H. pylori infection, nephrolithiasis, sleep apnea not on CPAP who presented to [facility] ED on 4/15 for evaluation of confusion, somnolence, and flank pain.  Patient is oriented to self only and not able to give any history. ?  ?Recently seen in the ED on 4/5 for dehydration, UTI, and possible H. pylori infection.  GI recommended outpatient follow-up. ?  ?In the ED, patient was found to be slightly tachycardic on arrival with low blood pressure in the 90s. Not febrile.  WBC 10.1.  BUN 15, creatinine 1.0.  T. bili 1.6, remainder of LFTs normal.  Lactic acid 2.5.  INR 1.3.  Blood cultures drawn.  UA pending.  Chest x-Fishbaugh showing patchy airspace disease within the right mid and lower lung zones consistent with bronchopneumonia.  CT abdomen pelvis with contrast negative for renal calculus or obstructive uropathy.  Showing trace right pleural effusion with patchy airspace disease at the right lung base concerning for pneumonia.  Also showing prominent right hilum with a hypodense region in the right pulmonary artery only partially visualized on exam, possible PE versus prominent lymph nodes.  Dedicated CT angiogram chest recommended for further evaluation.  Patient was given fentanyl, IV heparin, ceftriaxone, azithromycin, and 2 L LR boluses.  Repeat lactate 1.8.  Hospital service was consulted for further evaluation and management of altered mental status/confusion with concern for pneumonia versus PE.  ? ?**Interim History ?She continues to have extremely poor p.o. intake has been refusing her medications orally.  Palliative Care has been consulted for goals of care discussion and patient had a family conference yesterday with palliative care and was a complicated  situation given that the patient is still not eating or drinking.  She does have a history of a gastric ulcer and has a history of H. pylori and palliative was concerned that there may be an element of stomach pain so they have initiated IV Protonix and given ondansetron and recommended touching base with GI.  She has been consulted for further evaluation to see if we could possibly help her p.o. intake and if she would benefit from treatment.  We will continue with palliative goals of care conversation but patient still not eating and she is very pleasantly demented still.  Patient's family believes that she may improve her oral intake when she is at home. ? ?Patient still not eating anything and refused part of her labs. Continues to be significantly confused and has been intermittently hallucinating. GI ordering Upper GI Series this AM. She is not felt to be a good Endoscopic candidate given PE's. Palliative to follow up on GOC conversations today.   ? ?Assessment and Plan: ? ?Sepsis, POA and now resolved  ?Right-sided bronchopneumonia, improving  ?-Patient presenting with elevated WBC count of 10.2, chest x-Lefferts with evidence of right-sided pneumonia.   ?-WBC has improved and is now 4.8 ?-Strep pneumo urinary antigen: Negative.   ?-Given patient's presenting symptoms of confusion greater than her typical baseline we will continue treatment for pneumonia x5 days. ?-Completed Azithromycin 5 mg IV q24h x 5 days and Ceftriaxone 2 g IV q24h x 5 days and now stopped ?-SpO2: 100 % ?-Respiratory Status appears stable  ?-Repeat CXR showed "Improving appearance of airspace process in the RIGHT lower lobe. No  new areas of consolidation." ?  ?Bilateral pulmonary embolism with pulmonary infarct ?-CT angiogram chest with extensive chronic appearing pulmonary emboli within the distal right pulmonary artery extending to the right middle lobe and right lower lobar and segmental pulmonary arteries, segmental/subsegmental pulm  emboli noted within the left lower lobe which are age-indeterminate, CT evidence of right heart strain.   ?-Patient was initially started on heparin drip and now transition to Lovenox.  TTE with preserved LVEF, RV function within normal limits. ?-D-Dimer was 3.28  ?-Transitioned Lovenox to 1.5 mg/kg once daily dosing for ease of administration by family (ON Enoxaparin 100 mg sq q24h) ?-TOC for benefit check for Lovenox; patient unlikely to comply with oral medications due to diversion and refusing oral intake ?  ?Acute Metabolic Encephalopathy ?Hx Dementia ?-Patient presenting from home with confusion in the setting of history of advanced dementia.  Patient appears to be at baseline, but concern for worsening dementia/progression given her poor oral intake.   ?-Now hallucinating some ?-Treating pneumonia/PE as above.  Vitamin B12 210, TSH 1.010, urinalysis unrevealing. ?-C/w Delirium precautions ?-Get up during the day ?-Encourage a familiar face to remain present throughout the day ?-Keep blinds open and lights on during daylight hours ?-Minimize the use of opioids/benzodiazepines ?-Continues to be significantly confused  ?  ?Hypokalemia ?-Repleted. ?-K+ went from 3.2 -> 3.9 -> 4.0 -> 4.4 -> 4.2 -> 4.1 -> 4.0 ?-Continue to Monitor and Replete as Necessary  ?-Repeat CMP in the AM  ?  ?AKI, improved ?Metabolic Acidosis  ?-Creatinine 1.06 on admission, supported with IV fluid hydration with improvement of creatinine to 0.67. ?-Now BUN/Cr is <5/1.00 ?-Was initiated on D5 1/2 NS + 40 mEQ Kcl at 75 mL/hr given her poor po intake but was changed to Sodium Bicarbonate 150 mEQ in D5W at 75 mL/hr x 1 Day given her Metabolic Acidosis and now IVF has stopped  ?-Had a Metabolic Acidosis with a CO2 of 17, AG of 10, and Chloride of 106 and now with a CO2 of 22, chloride level of 106, and anion gap of 9 ?-Avoid nephrotoxic medications, contrast dyes, hypotension and dehydration renally dose medications ?-Repeat BMP/CMP in the  a.m. ?  ?Hyponatremia ?-Patient's Na+ went from 137 -> 133 -> 139 -> 138 -> 137 x2 ?-Continue to Monitor and Trend ?-Continue to Monitor and Replete as Necessary ?-Repeat CMP in the AM  ?  ?Hypophosphatemia ?-Patient's Phos Level is now 3.6 ?-Continue to Monitor and Replete as Necessary ?-Repeat Phos Level in the AM  ?  ?History of gastritis and H. pylori ulcer ?-Was not treated as an outpatient and will initiate IV Pantoprazole 40 mg daily but changed to IV PPI BID given her poor oral intake ?-Patient's Zofran is PRN Nausea  ?-GI Consulted for further evaluation recommending continuing PPI twice daily and they are checking an H. pylori stool antigen and they are ordering an upper GI Series to clarify status of PUD and try and avoid endoscopic intervention if necessary; Upper GI done and showed "Scout radiograph demonstrates non-obstructive bowel gas pattern and elevated right hemidiaphragm. A single sip of contrast was swallowed via teaspoon and only a minimally distended distal esophagus was captured with imaging. Patient declined to continue." ?-She does have a history of gastritis and linear duodenal bulb ulcer that was found in May 2021 and at that time she is H. pylori positive but did not complete treatment given that she is not able to swallow pills ?-She had abdominal pain since admission and  CT of the abdomen pelvis was done and showed no acute findings and it is possible that she may have recurrent or persistent gastritis or ulcer disease per GIs evaluation ?  ?Hyperbilirubinemia ?-Resolved.  T. bili is now 0.8 on last check ?-Continue to Monitor and Trend intermittently ?-Repeat CMP in the AM  ? ?AKI ?-Mild as BUN/Cr had been slowly trending up ?-Patient's BUN/Cr went from <5/0.77 on 02/26/22 and is now 6/1.05 ?-Initiated fluid hydration with D5 normal saline +20 mEq of KCl at 75 MLS per hour ?-Avoid nephrotoxic medications, contrast dyes, hypotension and dehydration and ensure adequate renal perfusion as  well as renally adjusting medications ?-Continue to monitor and trend renal function carefully and repeat CMP in the a.m. ?  ?Hypomagnesemia ?-Patient's mag level is now 2.0 ?-Continue monitor and replete as

## 2022-03-03 NOTE — Progress Notes (Addendum)
? ? ? ?Daily Progress Note ? ?Hospital Day: 10 ? ?Chief Complaint: abdominal pain  ? ?Assessment   ? ?Brief History ?Monica Erickson is a 75 y.o. female with a pmh not limited to dementia, depression, kidney stones, sleep apnea, diverticulosis, H.pylori ulcer (untreated), chronic PE. Prolonged admission for sepsis, right bronchopneumonia, metabolic encephalopathy, UTI, AKI, electrolyte imbalances, bilateral PE / pulmonary infarct, failure to thrive. We consulted on 4/21 for abdominal pain ? ? ?Abdominal pain / history of gastritis and duodneal ulcer May 2021. H.pylori + but she didn't tolerate treatment. CT scan this admission without acute abdominal findings.  ?Wells anemia without overt GI blood loos (despite Lovenox). Hgb stable at 11.1 ?Failure to thrive, poor oral intake, nausea. Patient was unable to tolerate contrast for UGI series.  ? ? ?Plan  ? ?She was unable to tolerate contrast for the UGI series ?H. pylori stool Ag is pending but results maybe skewed as PPI started before sample obtained. I doubt she would tolerate antibiotic regimen if H. pylori Ag is positive.  ?Continue BID PPI and prn Zofran ?Palliative Care following ?No plans for further GI workup. ?GI signing off.  ? ? ? Attending Physician Note  ? ?I have taken an interval history, reviewed the chart and examined the patient. I performed more than 50% of this encounter in conjunction with the APP. I agree with the APP's note, impression and recommendations with my edits.  ? ?Claudette Head, MD [facility] ?See [facility], [facility], for our on call provider  ? ? ? ?Subjective  ? ?No complaints. Smiling.  ? ? ?Objective  ? ?Endoscopic Studies  ?2021 EGD and colonoscopy  ?mild gastritis, had a linear ulcer in the duodenal bulb with slight deformity able to pass the scope without difficulty.  Path returned positive for H. Pylori. She didn't tolerate treatment ? ?Colonoscopy at that same time with increase stool in the right colon also noted multiple diverticuli and  otherwise negative exam. ? ? ?Imaging:  ? ?DG UGI W SINGLE CM (SOL OR THIN BA) ? ?Result Date: 03/03/2022 ?CLINICAL DATA:  Food refusal, History of h. pylori, dementia, egigastric TTP EXAM: DG UGI W SINGLE CM TECHNIQUE: Scout radiograph was obtained. Single contrast examination was attempted using thin liquid barium. This exam was performed by [facility], PA-C, and was supervised and interpreted by P. Allena Katz, MD. FLUOROSCOPY: Radiation Exposure Index (as provided by the fluoroscopic device): 1.9 mGy COMPARISON:  NONE. FINDINGS: Scout radiograph demonstrates non-obstructive bowel gas pattern and elevated right hemidiaphragm. A single sip of contrast was swallowed via teaspoon and only a minimally distended distal esophagus was captured with imaging. Patient declined to continue. IMPRESSION: Limited, non diagnostic study with only a sip of contrast swallowed by teaspoon. Dr. Orvan Falconer contacted to discuss limitations at time of exam. Electronically Signed   By: Guadlupe Spanish M.D.   On: 03/03/2022 09:29   ? ?Lab Results: ?Recent Labs  ?  03/01/22 ?0229 03/02/22 ?0405 03/03/22 ?0252  ?WBC 5.6 5.5 4.8  ?HGB 10.6* 10.3* 11.1*  ?HCT 32.2* 30.9* 33.8*  ?PLT 286 260 270  ? ?BMET ?Recent Labs  ?  03/01/22 ?0229 03/02/22 ?0405 03/03/22 ?0252  ?NA 138 137 137  ?K 4.2 4.1 4.0  ?CL 102 104 106  ?CO2 28 21* 22  ?GLUCOSE 80 71 75  ?BUN <5* <5* 6*  ?CREATININE 0.89 1.00 1.05*  ?CALCIUM 8.4* 8.8* 8.8*  ? ?LFT ?Recent Labs  ?  03/03/22 ?0252  ?PROT 6.1*  ?ALBUMIN 2.9*  ?AST  24  ?ALT 15  ?ALKPHOS 69  ?BILITOT 0.8  ? ?PT/INR ?No results for input(s): LABPROT, INR in the last 72 hours. ? ? ?Scheduled inpatient medications:  ? alum & mag hydroxide-simeth  30 mL Oral Once  ? enoxaparin (LOVENOX) injection  100 mg Subcutaneous Q24H  ? folic acid  1 mg Oral Daily  ? multivitamin with minerals  1 tablet Oral Daily  ? pantoprazole (PROTONIX) IV  40 mg Intravenous Q12H  ? vitamin B-12  1,000 mcg Oral Daily  ? ?Continuous inpatient infusions:   ? dextrose 5 % and 0.9 % NaCl with KCl 20 mEq/L 75 mL/hr at 03/03/22 0943  ? ?PRN inpatient medications: acetaminophen **OR** acetaminophen, ondansetron ? ?Vital signs in last 24 hours: ?Temp:  [98.1 ?F (36.7 ?C)-98.5 ?F (36.9 ?C)] 98.4 ?F (36.9 ?C) (04/24 1149) ?Pulse Rate:  [59-86] 68 (04/24 1149) ?Resp:  [15-20] 20 (04/24 1149) ?BP: (100-123)/(64-80) 110/72 (04/24 1149) ?SpO2:  [99 %-100 %] 99 % (04/24 1149) ?Last BM Date : 03/02/22 ? ?Intake/Output Summary (Last 24 hours) at 03/03/2022 1156 ?Last data filed at 03/02/2022 1300 ?Gross per 24 hour  ?Intake 120 ml  ?Output --  ?Net 120 ml  ? ? ?Physical Exam:  ?General: Awoke from sleep.l Resting comfortably ?Heart:  Regular rate and rhythm.  ?Pulmonary: Normal respiratory effort ?Abdomen: Soft, nondistended, nontender. Normal bowel sounds.  ?Neurologic: Alert , confused. Doesn't know where she is ?Psych: Pleasant. Cooperative.  ? ? ?Intake/Output from previous day: ?04/23 0701 - 04/24 0700 ?In: 120 [P.O.:120] ?Out: -  ?Intake/Output this shift: ?No intake/output data recorded. ? ? ? ?Principal Problem: ?  AMS (altered mental status) ?Active Problems: ?  Dementia without behavioral disturbance (HCC) ?  Poor appetite ?  Alzheimer's dementia (HCC) ?  Pulmonary embolism (HCC) ?  Failure to thrive in adult ?  Sepsis due to pneumonia [facility]) ?  Hypokalemia ?  Normocytic anemia ?  H. pylori infection ? ? ? ? LOS: 8 days  ? ?Willette Cluster ,NP 03/03/2022, 11:56 AM ? ? ? ? ? ? ?

## 2022-03-03 NOTE — Progress Notes (Signed)
Occupational Therapy Treatment ?Patient Details ?Name: Monica Erickson ?MRN: [id number] ?DOB: 1946/05/24 ?Today's Date: 03/03/2022 ? ? ?History of present illness 76 y.o. female admitted 4/15 with sepsis secondary to CAP, acute metabolic encephalopathy, possible PE, anorexia.  Recently seen in the ED on 4/5 for dehydration, UTI, and possible H. pylori infection. Prior medical history significant of dementia, chronic headaches, depression, gastric ulcer, H. pylori infection, nephrolithiasis, sleep apnea not on CPAP. ?  ?OT comments ? Patient received in bed and agreeable to OT session. Patient required mod assist to get to EOB and min assist to re-donn gown. Patient provided socks and was unable to donn and required max assist. Patient was hand held assist to transfer to recliner and performed grooming seated. Nursing asked to return patient to bed due to scheduled procedure this am. Acute OT to continue to follow.   ? ?Recommendations for follow up therapy are one component of a multi-disciplinary discharge planning process, led by the attending physician.  Recommendations may be updated based on patient status, additional functional criteria and insurance authorization. ?   ?Follow Up Recommendations ? Home health OT  ?  ?Assistance Recommended at Discharge Frequent or constant Supervision/Assistance  ?Patient can return home with the following ? A lot of help with walking and/or transfers;A lot of help with bathing/dressing/bathroom;Assistance with cooking/housework;Direct supervision/assist for medications management;Direct supervision/assist for financial management;Assist for transportation;Help with stairs or ramp for entrance ?  ?Equipment Recommendations ? Other (comment) (TBD)  ?  ?Recommendations for Other Services   ? ?  ?Precautions / Restrictions Precautions ?Precautions: Fall ?Restrictions ?Weight Bearing Restrictions: No  ? ? ?  ? ?Mobility Bed Mobility ?Overal bed mobility: Needs Assistance ?Bed Mobility:  Supine to Sit, Sit to Supine ?  ?  ?Supine to sit: Mod assist ?Sit to supine: Min assist ?  ?General bed mobility comments: assistance with LEs to EOB and min assist to return to supine ?  ? ?Transfers ?Overall transfer level: Needs assistance ?Equipment used: 1 person hand held assist ?Transfers: Sit to/from Stand, Bed to chair/wheelchair/BSC ?Sit to Stand: Min assist ?  ?  ?Step pivot transfers: Min assist ?  ?  ?General transfer comment: min assist and cues for safety and hand placement ?  ?  ?Balance Overall balance assessment: Needs assistance ?Sitting-balance support: Feet supported, No upper extremity supported ?Sitting balance-Leahy Scale: Fair ?Sitting balance - Comments: no assistance for balance sitting on EOB ?  ?Standing balance support: Single extremity supported ?Standing balance-Leahy Scale: Fair ?Standing balance comment: Stood from EOB and recliner with min assist ?  ?  ?  ?  ?  ?  ?  ?  ?  ?  ?  ?   ? ?ADL either performed or assessed with clinical judgement  ? ?ADL Overall ADL's : Needs assistance/impaired ?  ?  ?Grooming: Therapist, nutritional;Wash/dry hands;Supervision/safety;Sitting ?Grooming Details (indicate cue type and reason): seated in recliner ?  ?  ?  ?  ?Upper Body Dressing : Minimal assistance;Sitting ?Upper Body Dressing Details (indicate cue type and reason): donn gown ?Lower Body Dressing: Maximal assistance;Sitting/lateral leans ?Lower Body Dressing Details (indicate cue type and reason): donning socks ?  ?  ?  ?  ?  ?  ?  ?General ADL Comments: required frequent directions, unable to donn socks when given to her ?  ? ?Extremity/Trunk Assessment   ?  ?  ?  ?  ?  ? ?Vision   ?  ?  ?Perception   ?  ?Praxis   ?  ? ?  Cognition Arousal/Alertness: Awake/alert ?Behavior During Therapy: [facility] for tasks assessed/performed ?Overall Cognitive Status: History of cognitive impairments - at baseline ?  ?  ?  ?  ?  ?  ?  ?  ?  ?  ?  ?  ?  ?  ?  ?  ?General Comments: able to follow one step commands but  often requires directions/instructions repeated ?  ?  ?   ?Exercises   ? ?  ?Shoulder Instructions   ? ? ?  ?General Comments    ? ? ?Pertinent Vitals/ Pain       Pain Assessment ?Pain Assessment: Faces ?Faces Pain Scale: No hurt ?Pain Intervention(s): Monitored during session ? ?Home Living   ?  ?  ?  ?  ?  ?  ?  ?  ?  ?  ?  ?  ?  ?  ?  ?  ?  ?  ? ?  ?Prior Functioning/Environment    ?  ?  ?  ?   ? ?Frequency ? Min 2X/week  ? ? ? ? ?  ?Progress Toward Goals ? ?OT Goals(current goals can now be found in the care plan section) ? Progress towards OT goals: Progressing toward goals ? ?Acute Rehab OT Goals ?OT Goal Formulation: Patient unable to participate in goal setting ?Time For Goal Achievement: 03/12/22 ?Potential to Achieve Goals: Fair ?ADL Goals ?Additional ADL Goal #1: Pt will follow 2 step commands 75% of session with no verbal cuing. ?Additional ADL Goal #2: Pt will sequence 3-4 step ADL tasks, independently. ?Additional ADL Goal #3: Pt will verbalize 3 fall prevention techniques that she can use at home.  ?Plan Discharge plan remains appropriate   ? ?Co-evaluation ? ? ?   ?  ?  ?  ?  ? ?  ?AM-PAC OT "6 Clicks" Daily Activity     ?Outcome Measure ? ? Help from another person eating meals?: A Little ?Help from another person taking care of personal grooming?: A Little ?Help from another person toileting, which includes using toliet, bedpan, or urinal?: A Lot ?Help from another person bathing (including washing, rinsing, drying)?: A Lot ?Help from another person to put on and taking off regular upper body clothing?: A Little ?Help from another person to put on and taking off regular lower body clothing?: A Lot ?6 Click Score: 15 ? ?  ?End of Session   ? ?OT Visit Diagnosis: Unsteadiness on feet (R26.81);Other abnormalities of gait and mobility (R26.89);Muscle weakness (generalized) (M62.81) ?  ?Activity Tolerance Patient tolerated treatment well ?  ?Patient Left in bed;with call bell/phone within reach;with bed  alarm set ?  ?Nurse Communication Mobility status ?  ? ?   ? ?Time: 3785-8850 ?OT Time Calculation (min): 17 min ? ?Charges: OT General Charges ?$OT Visit: 1 Visit ?OT Treatments ?$Self Care/Home Management : 8-22 mins ? ?Alfonse Flavors, OTA ?Acute Rehabilitation Services  ?Pager [phone] ?Office [phone] ? ? ?Monica Erickson ?03/03/2022, 8:48 AM ?

## 2022-03-03 NOTE — Progress Notes (Signed)
Telemetry called pt had junctional rhythm at 71 then goes back to NSR. She also had episodes of sinus brady as low as 50. Pt is sleeping. HR went up to 60's when pt woke up and denies any discomfort. Messaged MD via amion. ?

## 2022-03-04 ENCOUNTER — Ambulatory Visit (HOSPITAL_COMMUNITY): Payer: Medicare Other

## 2022-03-04 DIAGNOSIS — R630 Anorexia: Secondary | ICD-10-CM | POA: Diagnosis not present

## 2022-03-04 DIAGNOSIS — R4182 Altered mental status, unspecified: Secondary | ICD-10-CM | POA: Diagnosis not present

## 2022-03-04 DIAGNOSIS — F0390 Unspecified dementia without behavioral disturbance: Secondary | ICD-10-CM | POA: Diagnosis not present

## 2022-03-04 DIAGNOSIS — E876 Hypokalemia: Secondary | ICD-10-CM | POA: Diagnosis not present

## 2022-03-04 DIAGNOSIS — R627 Adult failure to thrive: Secondary | ICD-10-CM | POA: Diagnosis not present

## 2022-03-04 LAB — COMPREHENSIVE METABOLIC PANEL
ALT: 13 U/L (ref 0–44)
AST: 21 U/L (ref 15–41)
Albumin: 2.6 g/dL — ABNORMAL LOW (ref 3.5–5.0)
Alkaline Phosphatase: 60 U/L (ref 38–126)
Anion gap: 8 (ref 5–15)
BUN: 5 mg/dL — ABNORMAL LOW (ref 8–23)
CO2: 22 mmol/L (ref 22–32)
Calcium: 8.4 mg/dL — ABNORMAL LOW (ref 8.9–10.3)
Chloride: 106 mmol/L (ref 98–111)
Creatinine, Ser: 0.89 mg/dL (ref 0.44–1.00)
GFR, Estimated: >60 mL/min (ref >60)
Glucose, Bld: 101 mg/dL — ABNORMAL HIGH (ref 70–99)
Potassium: 3.7 mmol/L (ref 3.5–5.1)
Sodium: 136 mmol/L (ref 135–145)
Total Bilirubin: 0.4 mg/dL (ref 0.3–1.2)
Total Protein: 5.7 g/dL — ABNORMAL LOW (ref 6.5–8.1)

## 2022-03-04 LAB — CBC WITH DIFFERENTIAL/PLATELET
Abs Immature Granulocytes: 0.04 10*3/uL (ref 0.00–0.07)
Basophils Absolute: 0.1 10*3/uL (ref 0.0–0.1)
Basophils Relative: 1 %
Eosinophils Absolute: 0.2 10*3/uL (ref 0.0–0.5)
Eosinophils Relative: 3 %
HCT: 30 % — ABNORMAL LOW (ref 36.0–46.0)
Hemoglobin: 10 g/dL — ABNORMAL LOW (ref 12.0–15.0)
Immature Granulocytes: 1 %
Lymphocytes Relative: 36 %
Lymphs Abs: 1.8 10*3/uL (ref 0.7–4.0)
MCH: 29.6 pg (ref 26.0–34.0)
MCHC: 33.3 g/dL (ref 30.0–36.0)
MCV: 88.8 fL (ref 80.0–100.0)
Monocytes Absolute: 0.4 10*3/uL (ref 0.1–1.0)
Monocytes Relative: 9 %
Neutro Abs: 2.4 10*3/uL (ref 1.7–7.7)
Neutrophils Relative %: 50 %
Platelets: 309 10*3/uL (ref 150–400)
RBC: 3.38 MIL/uL — ABNORMAL LOW (ref 3.87–5.11)
RDW: 15.7 % — ABNORMAL HIGH (ref 11.5–15.5)
WBC: 4.9 10*3/uL (ref 4.0–10.5)
nRBC: 0 % (ref 0.0–0.2)

## 2022-03-04 LAB — MAGNESIUM: Magnesium: 1.9 mg/dL (ref 1.7–2.4)

## 2022-03-04 LAB — PHOSPHORUS: Phosphorus: 3.1 mg/dL (ref 2.5–4.6)

## 2022-03-04 MED ORDER — MEGESTROL ACETATE 400 MG/10ML PO SUSP
200.0000 mg | Freq: Every day | ORAL | Status: DC
  Filled 2022-03-04 (×3): qty 10

## 2022-03-04 MED ORDER — MEGESTROL ACETATE 400 MG/10ML PO SUSP
200.0000 mg | Freq: Every day | ORAL | Status: DC
  Filled 2022-03-04: qty 5

## 2022-03-04 NOTE — Progress Notes (Signed)
? ?                                                                                                                                                     ?                                                   ?Daily Progress Note  ? ?Patient Name: Monica Erickson       Date: 03/04/2022 ?DOB: 07/16/1946  Age: 76 y.o. MRN#: [id number] ?Attending Physician: Merlene LaughterSheikh, Omair Latif, DO ?Primary Care Physician: Barbette MerinoKing, Crystal M, NP ?Admit Date: 02/22/2022 ? ?Reason for Consultation/Follow-up: Establishing goals of care ? ?Patient Profile/HPI:   76 y.o. female  with past medical history of dementia, depression, kidney stones, sleep apnea (does not use CPAP), chronic headaches, H. Pylori ulcer (untreated due to not able to swallow treatment, however, started on liquid antibiotics on 4/4 by ED visit to treat H. Pylori and UTI), diverticulosis,  admitted on 02/22/2022 with increasing confusion, R flank pain. Workup revealed sepsis, pneumonia, acute kidney injury. She also has a chronic PE and has been started on lovenox. She is declining to eat this admission and declining oral medications. Palliative medicine consulted for GOC.  ? ?Subjective: ?Chart reviewed- including labs, imaging and progress notes. Evaluated Meika. She was sitting up in a chair at bedside. I observed as she used her fork to feed herself. I asked how she was doing and she responded, "well, I'm trying to make it". She denied any pain.  ?I called daughter Monica Erickson and was able to speak with her.  ?We discussed that Monica Erickson is taking a few bites and sips here and there- but not taking in enough to sustain herself nutritionally.  ?Monica Erickson notes that at home [place] gra[place] throughout the day and that is how she prefers to eat. Monica Erickson inquired about appetite stimulant- we discussed that Monica Erickson refuses medications. Monica Erickson would like to try a liquid stimulant and she agrees to being present this evening to see if she could get her mother to take it.  ?We discussed goals for care given that  Monica Erickson is not eating or drinking enough to sustain life.  ?Options for artificial feeding including cortrak and PEG vs more comfort focused and hospice type care were discussed.  ?Monica Erickson believes that once Monica Erickson is home she will eat more. Monica Erickson would like to get patient home, and then if she continues not to eat and drink- she will take Monica Erickson to her primary care doctor to discuss artificial feeding vs comfort focused care.  ?We also discussed [name]'s toileting- Monica Erickson shares that at home they take her to the bathroom on a schedule- every four hours.  Once she is in the bathroom and sits on the toilet she is able to empty her bowels and bladder and this prevents her from being incontinent. She is requesting a toileting schedule for Monica Erickson while in the hospital.  ? ?Review of Systems  ?Unable to perform ROS: Dementia  ? ? ?Physical Exam ?Vitals and nursing note reviewed.  ?Cardiovascular:  ?   Rate and Rhythm: Normal rate.  ?Neurological:  ?   Mental Status: She is alert. She is disoriented.  ?         ? ?Vital Signs: BP 117/84 (BP Location: Right Arm)   Pulse 74   Temp 97.9 ?F (36.6 ?C) (Oral)   Resp 15   Ht 5\' 8"  (1.727 m)   Wt 65.1 kg   SpO2 100%   BMI 21.82 kg/m?  ?SpO2: SpO2: 100 % ?O2 Device: O2 Device: Room Air ?O2 Flow Rate:   ? ?Intake/output summary: No intake or output data in the 24 hours ending 03/04/22 1621 ? ?LBM: Last BM Date : 03/02/22 ?Baseline Weight: Weight: 65.1 kg ?Most recent weight: Weight: 65.1 kg ? ?     ?Palliative Assessment/Data: PPS: 20% ? ? ? ? ? ?Patient Active Problem List  ? Diagnosis Date Noted  ? Normocytic anemia   ? H. pylori infection   ? Pulmonary embolism ([place]) 02/25/2022  ? Failure to thrive in adult 02/25/2022  ? Sepsis due to pneumonia ([place]) 02/25/2022  ? Hypokalemia 02/25/2022  ? AMS (altered mental status) 02/23/2022  ? Pseudophakia of both eyes 02/26/2021  ? Alzheimer's dementia ([place]) 09/24/2020  ? Generalized abdominal pain 02/17/2020  ? Poor appetite 02/17/2020  ?  Weight loss 02/17/2020  ? Constipation 02/17/2020  ? Other hyperlipidemia 02/17/2020  ? Belching 02/17/2020  ? Stone, kidney 12/07/2018  ? Dementia without behavioral disturbance ([place]) 12/07/2018  ? ? ?Palliative Care Assessment & Plan  ? ? ?Assessment/Recommendations/Plan ? ?Continues not to eat or drink- trial megace liquid suspension 200mg  every evening- Monica Erickson plans to be present and assist with administration ?GOC continue to be to take [place] home- if she continues not to eat and drink Monica Erickson would like to discuss possible PEG vs comfort focused care with [place]'s PCP ?Patient does not take pills and this has been a consistent value throughout her lifetime ? ? ?Code Status: ?Full code ? ?Prognosis: ? Unable to determine ? ?Discharge Planning: ?Home with Palliative Services  ? ? ? ?Mariana Kaufman, AGNP-C ?Palliative Medicine ? ? ?Please contact Palliative Medicine Team phone at [phone] for questions and concerns.  ? ? ? ? ? ? ?

## 2022-03-04 NOTE — Progress Notes (Signed)
?PROGRESS NOTE ? ? ? ?[name] Daughdrill  NWG:[id number] DOB: 01/21/1946 DOA: 02/22/2022 ?PCP: Barbette MerinoKing, Crystal M, NP  ? ?Brief Narrative:  ?The patient Monica Erickson is a 76 y.o. female with medical history significant of dementia, chronic headaches, depression, gastric ulcer, H. pylori infection, nephrolithiasis, sleep apnea not on CPAP who [facility]15 for evaluation of confusion, somnolence, and flank pain.  Patient is oriented to self only and not able to give any history. ?  ?Recently seen in the ED on 4/5 for dehydration, UTI, and possible H. pylori infection.  GI recommended outpatient follow-up. ?  ?In the ED, patient was found to be slightly tachycardic on arrival with low blood pressure in the 90s. Not febrile.  WBC 10.1.  BUN 15, creatinine 1.0.  T. bili 1.6, remainder of LFTs normal.  Lactic acid 2.5.  INR 1.3.  Blood cultures drawn.  UA pending.  Chest x-Goris showing patchy airspace disease within the right mid and lower lung zones consistent with bronchopneumonia.  CT abdomen pelvis with contrast negative for renal calculus or obstructive uropathy.  Showing trace right pleural effusion with patchy airspace disease at the right lung base concerning for pneumonia.  Also showing prominent right hilum with a hypodense region in the right pulmonary artery only partially visualized on exam, possible PE versus prominent lymph nodes.  Dedicated CT angiogram chest recommended for further evaluation.  Patient was given fentanyl, IV heparin, ceftriaxone, azithromycin, and 2 L LR boluses.  Repeat lactate 1.8.  Hospital service was consulted for further evaluation and management of altered mental status/confusion with concern for pneumonia versus PE.  ? ?**Interim History ?She continues to have extremely poor p.o. intake has been refusing her medications orally.  Palliative Care has been consulted for goals of care discussion and patient had a family conference yesterday with palliative care and was a complicated  situation given that the patient is still not eating or drinking.  She does have a history of a gastric ulcer and has a history of H. pylori and palliative was concerned that there may be an element of stomach pain so they have initiated IV Protonix and given ondansetron and recommended touching base with GI.  She has been consulted for further evaluation to see if we could possibly help her p.o. intake and if she would benefit from treatment.  We will continue with palliative goals of care conversation but patient still not eating and she is very pleasantly demented still.  Patient's family believes that she may improve her oral intake when she is at home. ? ?She underwent Upper GI Series and it showed "Scout radiograph demonstrates non-obstructive bowel gas pattern and elevated right hemidiaphragm. A single sip of contrast was swallowed via teaspoon and only a minimally distended distal esophagus was captured with imaging. Patient declined to continue." ? ?Patient still not eating anything and continues to be significantly confused and has been intermittently hallucinating. Palliative to follow up on GOC conversations but unfortunately could not get in touch with the daughter yesterday.   ? ? ?Assessment and Plan: ? ?Sepsis, POA and now resolved  ?Right-sided bronchopneumonia, improving  ?-Patient presenting with elevated WBC count of 10.2, chest x-Dario with evidence of right-sided pneumonia.   ?-WBC has improved and is now 4.9 ?-Strep pneumo urinary antigen: Negative.   ?-Given patient's presenting symptoms of confusion greater than her typical baseline we will continue treatment for pneumonia x5 days. ?-Completed Azithromycin 5 mg IV q24h x 5 days and Ceftriaxone 2  g IV q24h x 5 days and now stopped ?-SpO2: 100 % ?-Respiratory Status appears stable  ?-Repeat CXR showed "Improving appearance of airspace process in the RIGHT lower lobe. No new areas of consolidation." ?  ?Bilateral pulmonary embolism with pulmonary  infarct ?-CT angiogram chest with extensive chronic appearing pulmonary emboli within the distal right pulmonary artery extending to the right middle lobe and right lower lobar and segmental pulmonary arteries, segmental/subsegmental pulm emboli noted within the left lower lobe which are age-indeterminate, CT evidence of right heart strain.   ?-Patient was initially started on heparin drip and now transition to Lovenox.  TTE with preserved LVEF, RV function within normal limits. ?-D-Dimer was 3.28  ?-Transitioned Lovenox to 1.5 mg/kg once daily dosing for ease of administration by family (ON Enoxaparin 100 mg sq q24h) ?-TOC for benefit check for Lovenox; patient unlikely to comply with oral medications due to diversion and refusing oral intake ?  ?Acute Metabolic Encephalopathy ?Hx Dementia ?-Patient presenting from home with confusion in the setting of history of advanced dementia.  Patient appears to be at baseline, but concern for worsening dementia/progression given her poor oral intake.   ?-Now hallucinating some ?-Treating pneumonia/PE as above.  Vitamin B12 210, TSH 1.010, urinalysis unrevealing. ?-C/w Delirium precautions ?-Get up during the day ?-Encourage a familiar face to remain present throughout the day ?-Keep blinds open and lights on during daylight hours ?-Minimize the use of opioids/benzodiazepines ?-Continues to be significantly confused and demented  ?  ?Hypokalemia ?-Repleted. ?-K+ went from 3.2 -> 3.9 -> 4.0 -> 4.4 -> 4.2 -> 4.1 -> 4.0 -> 3.7 ?-Continue to Monitor and Replete as Necessary  ?-Repeat CMP in the AM  ?  ?AKI, improved ?Metabolic Acidosis  ?-Creatinine 1.06 on admission, supported with IV fluid hydration with improvement of creatinine to 0.67. ?-Now BUN/Cr is <5/1.00 ?-Was initiated on D5 1/2 NS + 40 mEQ Kcl at 75 mL/hr given her poor po intake but was changed to Sodium Bicarbonate 150 mEQ in D5W at 75 mL/hr x 1 Day given her Metabolic Acidosis and now IVF has stopped  ?-Had a  Metabolic Acidosis with a CO2 of 17, AG of 10, and Chloride of 106 and now with a CO2 of 22, chloride level of 106, and anion gap of 8 ?-Avoid nephrotoxic medications, contrast dyes, hypotension and dehydration renally dose medications ?-Repeat BMP/CMP in the a.m. ?  ?Hyponatremia ?-Patient's Na+ went from 137 -> 133 -> 139 -> 138 -> 137 x2 -> 136 ?-Continue to Monitor and Trend ?-Continue to Monitor and Replete as Necessary ?-Repeat CMP in the AM  ?  ?Hypophosphatemia ?-Patient's Phos Level is now 3.1 ?-Continue to Monitor and Replete as Necessary ?-Repeat Phos Level in the AM  ?  ?History of gastritis and H. pylori ulcer ?-Was not treated as an outpatient and will initiate IV Pantoprazole 40 mg daily but changed to IV PPI BID given her poor oral intake ?-Patient's Zofran is PRN Nausea  ?-GI Consulted for further evaluation recommending continuing PPI twice daily and they are checking an H. pylori stool antigen and they are ordering an upper GI Series to clarify status of PUD and try and avoid endoscopic intervention if necessary; Upper GI done and showed "Scout radiograph demonstrates non-obstructive bowel gas pattern and elevated right hemidiaphragm. A single sip of contrast was swallowed via teaspoon and only a minimally distended distal esophagus was captured with imaging. Patient declined to continue." ?-She does have a history of gastritis and linear duodenal bulb  ulcer that was found in May 2021 and at that time she is H. pylori positive but did not complete treatment given that she is not able to swallow pills ?-She had abdominal pain since admission and CT of the abdomen pelvis was done and showed no acute findings and it is possible that she may have recurrent or persistent gastritis or ulcer disease per GIs evaluation ?-Not complaining of abdominal Pain but very confused still  ? ?Sinus Bradycardia ?-Not symptomatic ?-Resting in bed and improves when she was examined and more awake ?-Continue to Monitor  and Trend  ?  ?Hyperbilirubinemia ?-Resolved.  T. bili is now 0.4 on last check ?-Continue to Monitor and Trend intermittently ?-Repeat CMP in the AM  ?  ?AKI, improving  ?-Mild as BUN/Cr had been slowly t

## 2022-03-05 ENCOUNTER — Other Ambulatory Visit (HOSPITAL_COMMUNITY): Payer: Self-pay

## 2022-03-05 DIAGNOSIS — I2694 Multiple subsegmental pulmonary emboli without acute cor pulmonale: Secondary | ICD-10-CM

## 2022-03-05 DIAGNOSIS — Z515 Encounter for palliative care: Secondary | ICD-10-CM

## 2022-03-05 DIAGNOSIS — J189 Pneumonia, unspecified organism: Secondary | ICD-10-CM

## 2022-03-05 LAB — MAGNESIUM: Magnesium: 1.9 mg/dL (ref 1.7–2.4)

## 2022-03-05 LAB — PHOSPHORUS: Phosphorus: 3.2 mg/dL (ref 2.5–4.6)

## 2022-03-05 LAB — COMPREHENSIVE METABOLIC PANEL
ALT: 10 U/L (ref 0–44)
AST: 17 U/L (ref 15–41)
Albumin: 2.8 g/dL — ABNORMAL LOW (ref 3.5–5.0)
Alkaline Phosphatase: 67 U/L (ref 38–126)
Anion gap: 6 (ref 5–15)
BUN: <5 mg/dL — ABNORMAL LOW (ref 8–23)
CO2: 23 mmol/L (ref 22–32)
Calcium: 8.8 mg/dL — ABNORMAL LOW (ref 8.9–10.3)
Chloride: 108 mmol/L (ref 98–111)
Creatinine, Ser: 0.86 mg/dL (ref 0.44–1.00)
GFR, Estimated: >60 mL/min (ref >60)
Glucose, Bld: 82 mg/dL (ref 70–99)
Potassium: 4.8 mmol/L (ref 3.5–5.1)
Sodium: 137 mmol/L (ref 135–145)
Total Bilirubin: 0.5 mg/dL (ref 0.3–1.2)
Total Protein: 6.1 g/dL — ABNORMAL LOW (ref 6.5–8.1)

## 2022-03-05 LAB — H. PYLORI ANTIGEN, STOOL: H. Pylori Stool Ag, Eia: POSITIVE — AB

## 2022-03-05 MED ORDER — BISMUTH SUBSALICYLATE 262 MG/15ML PO SUSP
30.0000 mL | Freq: Four times a day (QID) | ORAL | Status: DC
  Filled 2022-03-05 (×2): qty 236

## 2022-03-05 MED ORDER — METRONIDAZOLE 50 MG/ML ORAL SUSPENSION
250.0000 mg | Freq: Four times a day (QID) | ORAL | Status: DC
  Filled 2022-03-05 (×8): qty 10

## 2022-03-05 MED ORDER — DOXYCYCLINE MONOHYDRATE 25 MG/5ML PO SUSR
100.0000 mg | Freq: Two times a day (BID) | ORAL | Status: DC
  Filled 2022-03-05 (×5): qty 20

## 2022-03-05 NOTE — Progress Notes (Addendum)
? ?                                                                                                                                                     ?                                                   ?Daily Progress Note  ? ?Patient Name: Monica Erickson       Date: 03/05/2022 ?DOB: [birth year]  Age: 76 y.o. MRN#: [id number] ?Attending Physician: Eugenie Filler, MD ?Primary Care Physician: Vevelyn Francois, NP ?Admit Date: 02/22/2022 ? ?Reason for Consultation/Follow-up: Establishing goals of care ? ?Patient Profile/HPI:   76 y.o. female  with past medical history of dementia, depression, kidney stones, sleep apnea (does not use CPAP), chronic headaches, H. Pylori ulcer (untreated due to not able to swallow treatment, however, started on liquid antibiotics on 4/4 by ED visit to treat H. Pylori and UTI), diverticulosis,  admitted on 02/22/2022 with increasing confusion, R flank pain. Workup revealed sepsis, pneumonia, acute kidney injury. Monica Erickson also has a chronic PE and has been started on lovenox. Monica Erickson is declining to eat this admission and declining oral medications. Palliative medicine consulted for [place].  ? ?Subjective: ?Chart reviewed- including labs, imaging and progress notes.  ?Monica Erickson was walking in the hall this morning with PT with only supervision. Monica Erickson smiled and verbally acknowledged my greeting. Monica Erickson stated "oh this is wonderful" when given a warm blanket.  ?Monica Erickson did not take megace last night. Monica Erickson did not come to the hospital.  ? ?Review of Systems  ?Unable to perform ROS: Dementia  ? ? ?Physical Exam ?Vitals and nursing note reviewed.  ?Cardiovascular:  ?   Rate and Rhythm: Normal rate.  ?Neurological:  ?   Mental Status: Monica Erickson is alert. Monica Erickson is disoriented.  ?         ? ?Vital Signs: BP 140/87 (BP Location: Left Arm)   Pulse 90   Temp 98 ?F (36.7 ?C) (Axillary)   Resp 20   Ht 5\' 8"  (1.727 m)   Wt 65.1 kg   SpO2 100%   BMI 21.82 kg/m?  ?SpO2: SpO2: 100 % ?O2 Device: O2 Device: Room Air ?O2 Flow Rate:    ? ?Intake/output summary:  ?Intake/Output Summary (Last 24 hours) at 03/05/2022 1003 ?Last data filed at 03/04/2022 1943 ?Gross per 24 hour  ?Intake --  ?Output 300 ml  ?Net -300 ml  ? ? ?LBM: Last BM Date : 03/04/22 ?Baseline Weight: Weight: 65.1 kg ?Most recent weight: Weight: 65.1 kg ? ?     ?Palliative Assessment/Data: PPS: 20% ? ? ? ? ? ?Patient Active Problem List  ?  Diagnosis Date Noted  ? Normocytic anemia   ? H. pylori infection   ? Pulmonary embolism ([place]) 02/25/2022  ? Failure to thrive in adult 02/25/2022  ? Sepsis due to pneumonia ([place]) 02/25/2022  ? Hypokalemia 02/25/2022  ? AMS (altered mental status) 02/23/2022  ? Pseudophakia of both eyes 02/26/2021  ? Alzheimer's dementia ([place]) 09/24/2020  ? Generalized abdominal pain 02/17/2020  ? Poor appetite 02/17/2020  ? Weight loss 02/17/2020  ? Constipation 02/17/2020  ? Other hyperlipidemia 02/17/2020  ? Belching 02/17/2020  ? Stone, kidney 12/07/2018  ? Dementia without behavioral disturbance ([place]) 12/07/2018  ? ? ?Palliative Care Assessment & Plan  ? ? ?Assessment/Recommendations/Plan ? ?Continues not to eat or drink- did not take megace or any other medications ?GOC continue to be to take [place] home- if Monica Erickson continues not to eat and drink Monica Erickson would like to discuss possible PEG vs comfort focused care with Monica Erickson's PCP ?Patient does not take pills and this has been a consistent value throughout her lifetime ?TOC consult placed for referral to outpatient Palliative at home ?Called Monica Erickson- left message requesting return call ? ? ?Code Status: ?Full code ? ?Prognosis: ? Unable to determine ? ?Discharge Planning: ?Home with Palliative Services  ? ? ? ?Mariana Kaufman, AGNP-C ?Palliative Medicine ? ? ?Please contact Palliative Medicine Team phone at [phone] for questions and concerns.  ? ? ? ? ? ? ?

## 2022-03-05 NOTE — TOC Benefit Eligibility Note (Signed)
Patient Advocate Encounter ? ?Insurance verification completed.   ? ?The patient is currently admitted and upon discharge could be taking enoxaparin (Lovenox) 100 mg/ml. ? ?The current 30 day co-pay is, $100.00.  ? ?The patient is insured through [employer]  ? ? ? ?Roland Earl, CPhT ?Pharmacy Patient Advocate Specialist ?[facility] Patient Advocate Team ?Direct Number: [phone]  Fax: [phone] ? ? ? ? ? ?  ?

## 2022-03-05 NOTE — Progress Notes (Signed)
ANTICOAGULATION CONSULT NOTE - Follow Up Consult ? ?Pharmacy Consult for Lovenox  ?Indication: PE ? ?No Known Allergies ? ?Patient Measurements: ?Height: 5\' 8"  (172.7 cm) ?Weight: 65.1 kg (143 lb 8.3 oz) ?IBW/kg (Calculated) : 63.9 ? ?Vital Signs: ?Temp: 97.6 ?F (36.4 ?C) (04/26 1219) ?Temp Source: Oral (04/26 1219) ?BP: 140/87 (04/26 0749) ?Pulse Rate: 74 (04/26 1219) ? ?Labs: ?Recent Labs  ?  03/03/22 ?0252 03/04/22 ?0259 03/05/22 ?0452  ?HGB 11.1* 10.0*  --   ?HCT 33.8* 30.0*  --   ?PLT 270 309  --   ?CREATININE 1.05* 0.89 0.86  ? ? ? ?Estimated Creatinine Clearance: 57.9 mL/min (by C-G formula based on SCr of 0.86 mg/dL). ? ? ?Assessment: ?76 year old female currently on SQ Lovenox for + PE.  Pt has advanced dementia and is intermittently refusing oral medications and food.  4/19 Pt was transitioned to once daily Lovenox in anticipation of discharge with family support.  Of note, patients has now also started refusing her Lovenox injections.  Last dose given 4/23. ? ?Please note, Lovenox copay anticipated to be $100 per month. ? ?Also noted pt started on Megace for appetite stimulation per palliative care (although patient has refused dose).  This medication is pro thrombotic.  The risks and benefits of this medication in the setting of current clot should be considered. ? ?Hgb stable 10-11, SCr <1 ? ?Goal of Therapy:  ?Anti-Xa level 0.6-1 units/ml 4hrs after LMWH dose given ?Monitor platelets by anticoagulation protocol: Yes ?  ?Plan:  ?Continue to offer Lovenox 1.5 mg/kg (100mg ) q 24 hours  ?Monitor CBC, renal fx and watch for s/s of bleeding ? ?[employer], Pharm.D., BCPS ?Clinical Pharmacist ? ?**Pharmacist phone directory can be found on [email or web address] listed under [place]. ? ?03/05/2022 2:30 PM ? ? ?

## 2022-03-05 NOTE — TOC Progression Note (Signed)
Transition of Care (TOC) - Progression Note  ? ? ?Patient Details  ?Name: Tressa Maldonado ?MRN: [id number] ?Date of Birth: 1946-09-25 ? ?Transition of Care (TOC) CM/SW Contact  ?Lawerance Sabal, RN ?Phone Number: ?03/05/2022, 3:53 PM ? ?Clinical Narrative:    ?Attempted to contact patient's daughter to discuss setting up palliative and home health services. No answer, left voicemail ? ? ?  ?  ? ?Expected Discharge Plan and Services ?  ?  ?  ?  ?  ?                ?  ?  ?  ?  ?  ?  ?  ?  ?  ?  ? ? ?Social Determinants of Health (SDOH) Interventions ?  ? ?Readmission Risk Interventions ?   ? View : No data to display.  ?  ?  ?  ? ? ?

## 2022-03-05 NOTE — Progress Notes (Signed)
?PROGRESS NOTE ? ? ? ?Monica Erickson  YHC:[id number] DOB: 1946-09-02 DOA: 02/22/2022 ?PCP: [name], NP  ? ? ?Chief Complaint  ?Patient presents with  ? Flank Pain  ? ? ?Brief Narrative:  ?The patient Monica Erickson is a 76 y.o. female with medical history significant of dementia, chronic headaches, depression, gastric ulcer, H. pylori infection, nephrolithiasis, sleep apnea not on CPAP who presented to [facility] ED on 4/15 for evaluation of confusion, somnolence, and flank pain.  Patient is oriented to self only and not able to give any history. ?  ?Recently seen in the ED on 4/5 for dehydration, UTI, and possible H. pylori infection.  GI recommended outpatient follow-up. ?  ?In the ED, patient was found to be slightly tachycardic on arrival with low blood pressure in the 90s. Not febrile.  WBC 10.1.  BUN 15, creatinine 1.0.  T. bili 1.6, remainder of LFTs normal.  Lactic acid 2.5.  INR 1.3.  Blood cultures drawn.  UA pending.  Chest x-[place] showing patchy airspace disease within the right mid and lower lung zones consistent with bronchopneumonia.  CT abdomen pelvis with contrast negative for renal calculus or obstructive uropathy.  Showing trace right pleural effusion with patchy airspace disease at the right lung base concerning for pneumonia.  Also showing prominent right hilum with a hypodense region in the right pulmonary artery only partially visualized on exam, possible PE versus prominent lymph nodes.  Dedicated CT angiogram chest recommended for further evaluation.  Patient was given fentanyl, IV heparin, ceftriaxone, azithromycin, and 2 L LR boluses.  Repeat lactate 1.8.  Hospital service was consulted for further evaluation and management of altered mental status/confusion with concern for pneumonia versus PE.  ?  ?**Interim History ?She continues to have extremely poor p.o. intake has been refusing her medications orally.  Palliative Care has been consulted for goals of care discussion and patient had a family  conference yesterday with palliative care and was a complicated situation given that the patient is still not eating or drinking.  She does have a history of a gastric ulcer and has a history of H. pylori and palliative was concerned that there may be an element of stomach pain so they have initiated IV Protonix and given ondansetron and recommended touching base with GI.  She has been consulted for further evaluation to see if we could possibly help her p.o. intake and if she would benefit from treatment.  We will continue with palliative goals of care conversation but patient still not eating and she is very pleasantly demented still.  Patient's family believes that she may improve her oral intake when she is at home. ?  ?She underwent Upper GI Series and it showed "Scout radiograph demonstrates non-obstructive bowel gas pattern and elevated right hemidiaphragm. A single sip of contrast was swallowed via teaspoon and only a minimally distended distal esophagus was captured with imaging. Patient declined to continue." ?  ?Patient still not eating anything and continues to be significantly confused and has been intermittently hallucinating. Palliative to follow up on GOC conversations but unfortunately could not get in touch with the daughter yesterday.   ?  ?  ? ? ?Assessment & Plan: ?  ?Principal Problem: ?  AMS (altered mental status) ?Active Problems: ?  Dementia without behavioral disturbance (HCC) ?  Poor appetite ?  Alzheimer's dementia (HCC) ?  Pulmonary embolism (HCC) ?  Failure to thrive in adult ?  Sepsis due to pneumonia [facility]) ?  Hypokalemia ?  Normocytic anemia ?  H. pylori infection ? ?#1 sepsis secondary to right-sided bronchopneumonia, POA, resolved ?-Patient had presented acute cytosis white count of 10.2, chest x-[place], CT angiogram chest concerning for right-sided pneumonia. ?-Urine strep pneumococcus antigen negative. ?-Urine Legionella antigen negative. ?-Due to patient's presentation with  worsening confusion from her baseline patient received a 5-day course of Rocephin and azithromycin. ?-Respiratory status stable. ?-Repeat chest x-[place] showed improvement with airspace process in the right lower lobe. ?-Supportive care. ? ?2.  Bilateral pulmonary emboli with pulmonary infarct ?-CT angiogram chest with extensive chronic appearing pulmonary emboli within the distal right pulmonary artery extending to the right middle lobe and right lower lobe and segmental pulmonary arteries, segmental/subsegmental pulmonary emboli noted within the left lower lobe which are age-indeterminate, CT evidence of right heart strain. ?-Lower extremity Dopplers negative for DVT. ?-Left upper extremity Dopplers negative for ?-2D echo with preserved EF, right ventricular function within normal limits. ?-D-dimer noted to be elevated at 3.28. ?-Patient initially was on a heparin drip, transitioned to Lovenox 1.5 mg per cake daily for ease of administration by family. ?-Patient unlikely to comply with oral medications due to divergent and refusing oral intake. ?-Outpatient follow-up with PCP. ? ?3.  Acute metabolic encephalopathy/history of dementia ?-Patient presented from home with confusion in the setting of advanced dementia. ?-Patient close to baseline however concern for worsening dementia/progression due to poor oral intake. ?-Patient with some intermittent hallucinations. ?-Status post 5-day course of antibiotics for pneumonia and on treatment for PE. ?-Vitamin B12 at 210, TSH at 1.010, urinalysis unrevealing. ?-Place on vitamin B-12 supplementation. ?-Continue with delirium precautions. ?-Encourage family of face to remain present throughout the day. ?-Keep blinds open and lights on during daylight hours. ?-'s use of opioids/benzos. ?-Supportive care. ?-Palliative care following. ? ?4.  Hypokalemia ?-Repleted. ? ?5.  AKI/metabolic acidosis ?-Improved with hydration. ?-Acidosis resolved ?-IV fluids discontinued. ?-Repeat  labs in the AM. ? ?6.  Hyponatremia ?-Improved with hydration. ?-Resolved. ? ?7.  Hypophosphatemia ?-Status post repletion. ?-Phosphorus level at 3.2. ? ?8.  History of gastritis and H. pylori ulcer ?-Not treated in the outpatient setting patient currently on IV PPI twice daily due to poor oral intake. ?-Patient also on Zofran as needed. ?-Patient seen in consultation by GI who recommended PPI twice daily, H. pylori stool antigen was ordered and noted to be positive. ?-Upper GI series done showed "Scout radiograph demonstrates non-obstructive bowel gas pattern and elevated right hemidiaphragm. A single sip of contrast was swallowed via teaspoon and only a minimally distended distal esophagus was captured with imaging. Patient declined to continue." ?-Patient with history of gastritis, linear duodenal bulb ulcer found in May 2021 at that time H. pylori was positive but did not complete treatment given that she is not able to swallow pills. ?-Patient noted to have abdominal pain on admission CT abdomen and pelvis done with no acute findings however possible she may have recurrent or persistent gastritis or ulcer disease per GIs evaluation. ?-Patient seen by GI not sure as to whether patient will complete treatment however as H. pylori stool antigen was positive trial of bismuth 300 mg 4 times daily x2 weeks, Flagyl 250 4 times daily x2 weeks, doxycycline 100 mg twice daily x2 weeks.  PPI twice daily x2 weeks. ?-Supportive care. ? ?9.  Sinus bradycardia ?-Asymptomatic. ?-Heart rate improves with exertion. ? ?10.  Hyperbilirubinemia ?-Resolved. ? ?11.  Hypomagnesemia ?-Repleted. ? ?12.  Normocytic anemia ?-Likely dilutional due to IV fluids. ?-Anemia panel with iron level of  37, TIBC of 202, ferritin of 254, folate of 11.6, vitamin B12 649. ?-H&H stable ?-Outpatient follow-up. ? ?13.  Adult failure to thrive/hypoalbuminemia ?-Likely secondary to progressive dementia. ?-Patient noted with low albumin levels. ?-It is  noted that family reports poor oral intake at baseline. ?-Noted to have declined consideration for G-tube in the past. ?- Dr. [place] has discussed with patient's daughter, [name] regarding the progressive nature of dem

## 2022-03-05 NOTE — Progress Notes (Signed)
Occupational Therapy Treatment ?Patient Details ?Name: Monica Erickson ?MRN: [id number] ?DOB: 12-Nov-1945 ?Today's Date: 03/05/2022 ? ? ?History of present illness 76 y.o. female admitted 4/15 with sepsis secondary to CAP, acute metabolic encephalopathy, possible PE, anorexia.  Recently seen in the ED on 4/5 for dehydration, UTI, and possible H. pylori infection. Prior medical history significant of dementia, chronic headaches, depression, gastric ulcer, H. pylori infection, nephrolithiasis, sleep apnea not on CPAP. ?  ?OT comments ? Patient received in bed and required mod assist to get to EOB due to assistance with BLEs. Patient was HHA to ambulate to sink to attempt grooming tasks. Patient was unable to follow commands for washing face and hands or oral care. Patient sat down at sink and was able to wash face and hands while seated but no brush teeth. Patient began crying during treatment and was assisted back to supine and placed in chair position. Lunch tray was setup for patient with patient able to feed self with occasional cue to stay on task. Acute OT to continue to follow.   ? ?Recommendations for follow up therapy are one component of a multi-disciplinary discharge planning process, led by the attending physician.  Recommendations may be updated based on patient status, additional functional criteria and insurance authorization. ?   ?Follow Up Recommendations ? Home health OT  ?  ?Assistance Recommended at Discharge Frequent or constant Supervision/Assistance  ?Patient can return home with the following ? A lot of help with walking and/or transfers;A lot of help with bathing/dressing/bathroom;Assistance with cooking/housework;Direct supervision/assist for medications management;Direct supervision/assist for financial management;Assist for transportation;Help with stairs or ramp for entrance ?  ?Equipment Recommendations ? None recommended by OT  ?  ?Recommendations for Other Services   ? ?  ?Precautions /  Restrictions Precautions ?Precautions: Fall ?Restrictions ?Weight Bearing Restrictions: No  ? ? ?  ? ?Mobility Bed Mobility ?Overal bed mobility: Needs Assistance ?Bed Mobility: Supine to Sit, Sit to Supine ?  ?  ?Supine to sit: Mod assist ?Sit to supine: Mod assist ?  ?General bed mobility comments: Patient supine upon arrival and required mod assist to get to EOB and to return to supine due to assistance needed with BLEs. ?  ? ?Transfers ?Overall transfer level: Needs assistance ?Equipment used: 1 person hand held assist ?Transfers: Sit to/from Stand ?Sit to Stand: Min assist ?  ?  ?Step pivot transfers: Min assist ?  ?  ?General transfer comment: hand held assist to lead patient in room to sink and back to bed ?  ?  ?Balance Overall balance assessment: Needs assistance ?Sitting-balance support: Feet supported, No upper extremity supported ?Sitting balance-Leahy Scale: Fair ?Sitting balance - Comments: no assistance for balance sitting on EOB ?  ?Standing balance support: No upper extremity supported ?Standing balance-Leahy Scale: Fair ?Standing balance comment: stood at sink without UE support but unable to follow comands ?  ?  ?  ?  ?  ?  ?  ?  ?  ?  ?  ?   ? ?ADL either performed or assessed with clinical judgement  ? ?ADL Overall ADL's : Needs assistance/impaired ?Eating/Feeding: Supervision/ safety;Bed level ?Eating/Feeding Details (indicate cue type and reason): patient in chair position in bed and required setup and occasional verbal cues to stay on tasks ?Grooming: Wash/dry face;Minimal assistance ?Grooming Details (indicate cue type and reason): Patient stood at sink for grooming and was unable to follow directions for brushing teeth or wash hands and face. Patient sat at sink and was able to  wash face on command but unable to brush teeth ?  ?  ?  ?  ?  ?  ?  ?  ?  ?  ?  ?  ?  ?  ?  ?General ADL Comments: difficulty following directions ?  ? ?Extremity/Trunk Assessment   ?  ?  ?  ?  ?  ? ?Vision   ?  ?   ?Perception   ?  ?Praxis   ?  ? ?Cognition Arousal/Alertness: Awake/alert ?Behavior During Therapy: Flat affect ?Overall Cognitive Status: History of cognitive impairments - at baseline ?  ?  ?  ?  ?  ?  ?  ?  ?  ?  ?  ?  ?  ?  ?  ?  ?General Comments: difficulty following directions. Patient began crying during treatment without explanation ?  ?  ?   ?Exercises   ? ?  ?Shoulder Instructions   ? ? ?  ?General Comments    ? ? ?Pertinent Vitals/ Pain       Pain Assessment ?Pain Assessment: Faces ?Faces Pain Scale: No hurt ?Pain Intervention(s): Monitored during session ? ?Home Living   ?  ?  ?  ?  ?  ?  ?  ?  ?  ?  ?  ?  ?  ?  ?  ?  ?  ?  ? ?  ?Prior Functioning/Environment    ?  ?  ?  ?   ? ?Frequency ? Min 2X/week  ? ? ? ? ?  ?Progress Toward Goals ? ?OT Goals(current goals can now be found in the care plan section) ? Progress towards OT goals: Progressing toward goals ? ?Acute Rehab OT Goals ?OT Goal Formulation: Patient unable to participate in goal setting ?Time For Goal Achievement: 03/12/22 ?Potential to Achieve Goals: Fair ?ADL Goals ?Additional ADL Goal #1: Pt will follow 2 step commands 75% of session with no verbal cuing. ?Additional ADL Goal #2: Pt will sequence 3-4 step ADL tasks, independently. ?Additional ADL Goal #3: Pt will verbalize 3 fall prevention techniques that she can use at home.  ?Plan Discharge plan remains appropriate   ? ?Co-evaluation ? ? ?   ?  ?  ?  ?  ? ?  ?AM-PAC OT "6 Clicks" Daily Activity     ?Outcome Measure ? ? Help from another person eating meals?: A Little ?Help from another person taking care of personal grooming?: A Little ?Help from another person toileting, which includes using toliet, bedpan, or urinal?: A Lot ?Help from another person bathing (including washing, rinsing, drying)?: A Lot ?Help from another person to put on and taking off regular upper body clothing?: A Little ?Help from another person to put on and taking off regular lower body clothing?: A Lot ?6 Click  Score: 15 ? ?  ?End of Session Equipment Utilized During Treatment: Gait belt ? ?OT Visit Diagnosis: Unsteadiness on feet (R26.81);Other abnormalities of gait and mobility (R26.89);Muscle weakness (generalized) (M62.81) ?  ?Activity Tolerance Patient tolerated treatment well ?  ?Patient Left in bed;with call bell/phone within reach;with bed alarm set;with nursing/sitter in room ?  ?Nurse Communication Mobility status ?  ? ?   ? ?Time: [id number] ?OT Time Calculation (min): 27 min ? ?Charges: OT General Charges ?$OT Visit: 1 Visit ?OT Treatments ?$Self Care/Home Management : 23-37 mins ? ?Lodema Hong, OTA ?Acute Rehabilitation Services  ?Pager [phone] ?Office [phone] ? ? ?[place] ?03/05/2022, 2:52 PM ?

## 2022-03-05 NOTE — Progress Notes (Signed)
Physical Therapy Treatment ?Patient Details ?Name: Monica Erickson ?MRN: [id number] ?DOB: [birth year] ?Today's Date: 03/05/2022 ? ? ?History of Present Illness 76 y.o. female admitted 4/15 with sepsis secondary to CAP, acute metabolic encephalopathy, possible PE, anorexia.  Recently seen in the ED on 4/5 for dehydration, UTI, and possible H. pylori infection. Prior medical history significant of dementia, chronic headaches, depression, gastric ulcer, H. pylori infection, nephrolithiasis, sleep apnea not on CPAP. ? ?  ?PT Comments  ? ? Pt continues to have noted cognitive deficits requiring freq/constant multimodal cues and max encouragement to engage in mobility. Pt with poor comprehension however once movement is initiated pt is functioning at min/min guard assist. Pt able to amb 150 without AD with close min guard. Suspect pt close to baseline. Returning home with family to support 24/7 is optimal d/c plan for patient once medically stable. Acute PT to cont to follow.   ?Recommendations for follow up therapy are one component of a multi-disciplinary discharge planning process, led by the attending physician.  Recommendations may be updated based on patient status, additional functional criteria and insurance authorization. ? ?Follow Up Recommendations ? Home health PT ?  ?  ?Assistance Recommended at Discharge Frequent or constant Supervision/Assistance  ?Patient can return home with the following A little help with bathing/dressing/bathroom;Assistance with cooking/housework;Direct supervision/assist for medications management;Direct supervision/assist for financial management;Assist for transportation;A little help with walking and/or transfers ?  ?Equipment Recommendations ? None recommended by PT  ?  ?Recommendations for Other Services   ? ? ?  ?Precautions / Restrictions Precautions ?Precautions: Fall ?Restrictions ?Weight Bearing Restrictions: No  ?  ? ?Mobility ? Bed Mobility ?  ?  ?  ?  ?  ?  ?  ?General bed  mobility comments: pt sitting up in chair upon PT arrival ?  ? ?Transfers ?Overall transfer level: Needs assistance ?Equipment used: None ?Transfers: Sit to/from Stand ?Sit to Stand: Min assist ?  ?  ?  ?  ?  ?General transfer comment: minA to initiate, pt initially refusing mobility due to lack of comprehension, pt then stood up, minA strictly for cueing ?  ? ?Ambulation/Gait ?Ambulation/Gait assistance: Min guard ?Gait Distance (Feet): 150 Feet ?Assistive device: None ?Gait Pattern/deviations: Step-through pattern, Decreased stride length ?Gait velocity: decr ?Gait velocity interpretation: <1.31 ft/sec, indicative of household ambulator ?  ?General Gait Details: Assist for safety. No overt instability ? ? ?Stairs ?  ?  ?  ?  ?  ? ? ?Wheelchair Mobility ?  ? ?Modified Rankin (Stroke Patients Only) ?  ? ? ?  ?Balance Overall balance assessment: Needs assistance ?Sitting-balance support: Feet supported, No upper extremity supported ?Sitting balance-Leahy Scale: Fair ?  ?  ?Standing balance support: No upper extremity supported ?Standing balance-Leahy Scale: Fair ?Standing balance comment: Stood from EOB and recliner with min guard assist ?  ?  ?  ?  ?  ?  ?  ?  ?  ?  ?  ?  ? ?  ?Cognition Arousal/Alertness: Awake/alert ?Behavior During Therapy: Flat affect ?Overall Cognitive Status: History of cognitive impairments - at baseline ?  ?  ?  ?  ?  ?  ?  ?  ?  ?  ?  ?  ?  ?  ?  ?  ?General Comments: Decr initiation and needs tactile cues and max encouragemet to initiate mobility/tasks, h/o dementia, slow to respond, shook head no when asked if her first name was susan but also couldn't/wouldn't state her name, first or last ?  ?  ? ?  ?  Exercises   ? ?  ?General Comments General comments (skin integrity, edema, etc.): VSS on RA ?  ?  ? ?Pertinent Vitals/Pain Pain Assessment ?Pain Assessment: Faces ?Faces Pain Scale: No hurt  ? ? ?Home Living   ?  ?  ?  ?  ?  ?  ?  ?  ?  ?   ?  ?Prior Function    ?  ?  ?   ? ?PT Goals  (current goals can now be found in the care plan section) Acute Rehab PT Goals ?PT Goal Formulation: With patient/family ?Time For Goal Achievement: 03/11/22 ?Potential to Achieve Goals: Fair ?Progress towards PT goals: Progressing toward goals ? ?  ?Frequency ? ? ? Min 3X/week ? ? ? ?  ?PT Plan Current plan remains appropriate  ? ? ?Co-evaluation   ?  ?  ?  ?  ? ?  ?AM-PAC PT "6 Clicks" Mobility   ?Outcome Measure ? Help needed turning from your back to your side while in a flat bed without using bedrails?: A Little ?Help needed moving from lying on your back to sitting on the side of a flat bed without using bedrails?: A Little ?Help needed moving to and from a bed to a chair (including a wheelchair)?: A Little ?Help needed standing up from a chair using your arms (e.g., wheelchair or bedside chair)?: A Little ?Help needed to walk in hospital room?: A Little ?Help needed climbing 3-5 steps with a railing? : A Little ?6 Click Score: 18 ? ?  ?End of Session Equipment Utilized During Treatment: Gait belt ?Activity Tolerance: Patient tolerated treatment well ?Patient left: in bed;with call bell/phone within reach;with bed alarm set ?Nurse Communication: Mobility status ?PT Visit Diagnosis: Unsteadiness on feet (R26.81) ?  ? ? ?Time: 6803-2122 ?PT Time Calculation (min) (ACUTE ONLY): 17 min ? ?Charges:  $Gait Training: 8-22 mins          ?          ? ?Lewis Shock, PT, DPT ?Acute Rehabilitation Services ?Secure chat preferred ?Office #: [phone] ? ? ? ?Caulder Wehner M Jazen Spraggins ?03/05/2022, 11:51 AM ? ?

## 2022-03-05 NOTE — Progress Notes (Signed)
Pt continues to refuse medications despite extensive effort. Pt states that she will but then doesn't when this RN attempts to administer. ? ?

## 2022-03-06 ENCOUNTER — Other Ambulatory Visit (HOSPITAL_COMMUNITY): Payer: Self-pay

## 2022-03-06 LAB — COMPREHENSIVE METABOLIC PANEL
ALT: 11 U/L (ref 0–44)
AST: 17 U/L (ref 15–41)
Albumin: 2.8 g/dL — ABNORMAL LOW (ref 3.5–5.0)
Alkaline Phosphatase: 64 U/L (ref 38–126)
Anion gap: 7 (ref 5–15)
BUN: <5 mg/dL — ABNORMAL LOW (ref 8–23)
CO2: 23 mmol/L (ref 22–32)
Calcium: 8.6 mg/dL — ABNORMAL LOW (ref 8.9–10.3)
Chloride: 106 mmol/L (ref 98–111)
Creatinine, Ser: 1.09 mg/dL — ABNORMAL HIGH (ref 0.44–1.00)
GFR, Estimated: 53 mL/min — ABNORMAL LOW (ref >60)
Glucose, Bld: 82 mg/dL (ref 70–99)
Potassium: 4.1 mmol/L (ref 3.5–5.1)
Sodium: 136 mmol/L (ref 135–145)
Total Bilirubin: 0.6 mg/dL (ref 0.3–1.2)
Total Protein: 6 g/dL — ABNORMAL LOW (ref 6.5–8.1)

## 2022-03-06 MED ORDER — FOLIC ACID 1 MG PO TABS
1.0000 mg | ORAL_TABLET | Freq: Every day | ORAL | 1 refills | Status: DC
  Filled 2022-03-06: qty 30, 30d supply, fill #0

## 2022-03-06 MED ORDER — DOXYCYCLINE MONOHYDRATE 25 MG/5ML PO SUSR
100.0000 mg | Freq: Two times a day (BID) | ORAL | 0 refills | Status: AC
  Filled 2022-03-06: qty 180, 5d supply, fill #0

## 2022-03-06 MED ORDER — MEGESTROL ACETATE 40 MG/ML PO SUSP
200.0000 mg | Freq: Every day | ORAL | 0 refills | Status: DC
  Filled 2022-03-06: qty 150, 30d supply, fill #0

## 2022-03-06 MED ORDER — OMEPRAZOLE 40 MG PO CPDR
40.0000 mg | DELAYED_RELEASE_CAPSULE | Freq: Two times a day (BID) | ORAL | 0 refills | Status: DC
  Filled 2022-03-06: qty 28, 14d supply, fill #0

## 2022-03-06 MED ORDER — CYANOCOBALAMIN 1000 MCG/ML IJ SOLN
INTRAMUSCULAR | 0 refills | Status: AC
  Filled 2022-03-06: qty 11, 37d supply, fill #0

## 2022-03-06 MED ORDER — METRONIDAZOLE 250 MG PO TABS
250.0000 mg | ORAL_TABLET | Freq: Four times a day (QID) | ORAL | 0 refills | Status: AC
  Filled 2022-03-06: qty 280, 14d supply, fill #0

## 2022-03-06 MED ORDER — ENSURE ACTIVE PO LIQD: 1.0000 | Freq: Two times a day (BID) | ORAL | Status: AC

## 2022-03-06 MED ORDER — ENOXAPARIN SODIUM 100 MG/ML IJ SOSY
100.0000 mg | PREFILLED_SYRINGE | INTRAMUSCULAR | 2 refills | Status: DC
  Filled 2022-03-06: qty 30, 30d supply, fill #0

## 2022-03-06 MED ORDER — BISMUTH SUBSALICYLATE 262 MG/15ML PO SUSP
30.0000 mL | Freq: Four times a day (QID) | ORAL | 0 refills | Status: AC
  Filled 2022-03-06: qty 1680, 14d supply, fill #0

## 2022-03-06 NOTE — Discharge Summary (Addendum)
Physician Discharge Summary  ?Emerald Morfin WC:[id number] DOB: April 14, 1946 DOA: 02/22/2022 ? ?PCP: Vevelyn Francois, NP ? ?Admit date: 02/22/2022 ?Discharge date: 03/06/2022 ? ?Time spent: 60 minutes ? ?Recommendations for Outpatient Follow-up:  ?Patient with discharge home with home health therapies. ?Outpatient follow-up with palliative care services. ?Follow-up with Vevelyn Francois, NP in 2 weeks.  On follow-up patient will need a basic metabolic profile, magnesium level, phosphorus level to follow-up on electrolytes and renal function.  Follow-up with patient on compliance with medications for treatment of H. pylori.  Patient also need a CBC done to follow-up on H&H. Pulmonary emboli will need to be followed up on and duration of treatment.  ? ? ? ?Discharge Diagnoses:  ?Principal Problem: ?  AMS (altered mental status) ?Active Problems: ?  Dementia without behavioral disturbance ([place]) ?  Poor appetite ?  Alzheimer's dementia ([place]) ?  Pulmonary embolism ([place]) ?  Failure to thrive in adult ?  Sepsis due to pneumonia [facility]) ?  Hypokalemia ?  Normocytic anemia ?  H. pylori infection ?  Pneumonia of right lung due to infectious organism ? ? ?Discharge Condition: Stable ? ?Diet recommendation: Dysphagia 3 diet with thin liquids ? ?Filed Weights  ? 02/23/22 0035  ?Weight: 65.1 kg  ? ? ?History of present illness:  ?HPI per Dr. Marlowe Sax ?Monica Erickson is a 76 y.o. female with medical history significant of dementia, chronic headaches, depression, gastric ulcer, H. pylori infection, nephrolithiasis, sleep apnea not on CPAP.  Recently seen in the ED on 4/5 for dehydration, UTI, and possible H. pylori infection.  GI recommended outpatient follow-up. ?  ?She presents to the ED for evaluation of confusion, somnolence, and flank pain.  Slightly tachycardic on arrival with low blood pressure in the 90s.  Not febrile.  WBC 10.1.  BUN 15, creatinine 1.0.  T. bili 1.6, remainder of LFTs normal.  Lactic acid 2.5.  INR 1.3.  Blood cultures  drawn.  UA pending.  Chest x-Ecklund showing patchy airspace disease within the right mid and lower lung zones consistent with bronchopneumonia.  CT abdomen pelvis with contrast negative for renal calculus or obstructive uropathy.  Showing trace right pleural effusion with patchy airspace disease at the right lung base concerning for pneumonia.  Also showing prominent right hilum with a hypodense region in the right pulmonary artery only partially visualized on exam, possible PE versus prominent lymph nodes.  Dedicated CT angiogram chest recommended for further evaluation.  Patient was given fentanyl, IV heparin, ceftriaxone, azithromycin, and 2 L LR boluses.  Repeat lactate 1.8. ?  ?Patient is oriented to self only and not able to give any history. ? ?Hospital Course:  ?#1 sepsis secondary to right-sided bronchopneumonia, POA, resolved ?-Patient had presented acute leucocytosis white count of 10.2, chest x-Florek, CT angiogram chest concerning for right-sided pneumonia. ?-Urine strep pneumococcus antigen negative. ?-Urine Legionella antigen negative. ?-Due to patient's presentation with worsening confusion from her baseline patient received a 5-day course of Rocephin and azithromycin. ?-Respiratory status stable. ?-Repeat chest x-Rae showed improvement with airspace process in the right lower lobe. ?-Outpatient follow-up. ? ?2.  Bilateral pulmonary emboli with pulmonary infarct ?-CT angiogram chest with extensive chronic appearing pulmonary emboli within the distal right pulmonary artery extending to the right middle lobe and right lower lobe and segmental pulmonary arteries, segmental/subsegmental pulmonary emboli noted within the left lower lobe which are age-indeterminate, CT evidence of right heart strain. ?-Lower extremity Dopplers negative for DVT. ?-Left upper extremity Dopplers negative for DVT. ?-2D echo  with preserved EF, right ventricular function within normal limits. ?-D-dimer noted to be elevated at  3.28. ?-Patient initially was on a heparin drip, transitioned to Lovenox 1.5 mg/kg daily for ease of administration by family. ?-Patient unlikely to comply with oral medications due to divergent and refusing oral intake. ?-Outpatient follow-up with PCP. ? ?3.  Acute metabolic encephalopathy/history of dementia ?-Patient presented from home with confusion in the setting of advanced dementia. ?-Patient close to baseline however concern for worsening dementia/progression due to poor oral intake. ?-Patient with some intermittent hallucinations. ?-Status post 5-day course of antibiotics for pneumonia and on treatment for PE. ?-Vitamin B12 at 210, TSH at 1.010, urinalysis unrevealing. ?-Patient started on vitamin B-12 supplementation. ?-Patient placed on delirium precautions. ?-Patient assessed by palliative care during the hospitalization. ?-Outpatient follow-up with palliative care. ? ?4.  Hypokalemia ?-Repleted. ? ?5.  AKI/metabolic acidosis ?-Improved with hydration. ?-Acidosis resolved ? ?6.  Hyponatremia ?-Improved with hydration. ?-Resolved. ? ?7.  Hypophosphatemia ?-Repleted. ? ?8.  History of gastritis and H. pylori ulcer ?-Not treated in the outpatient setting patient patient was maintained on on IV PPI twice daily due to poor oral intake. ?-Patient also on Zofran as needed. ?-Patient seen in consultation by GI who recommended PPI twice daily, H. pylori stool antigen was ordered and noted to be positive. ?-Upper GI series done showed "Scout radiograph demonstrates non-obstructive bowel gas pattern and elevated right hemidiaphragm. A single sip of contrast was swallowed via teaspoon and only a minimally distended distal esophagus was captured with imaging. Patient declined to continue." ?-Patient with history of gastritis, linear duodenal bulb ulcer found in May 2021 at that time H. pylori was positive but did not complete treatment given that she is not able to swallow pills. ?-Patient noted to have abdominal  pain on admission CT abdomen and pelvis done with no acute findings however possible she may have recurrent or persistent gastritis or ulcer disease per GIs evaluation. ?-Patient seen by GI not sure as to whether patient will complete treatment however as H. pylori stool antigen was positive trial of bismuth 300 mg 4 times daily x2 weeks, Flagyl 250 4 times daily x2 weeks, doxycycline 100 mg twice daily x2 weeks.  PPI twice daily x2 weeks was ordered. ?-Patient noted per RN to be refusing medications. ?-Patient will be discharged on this regimen and hopefully patient will be able to tolerated in the outpatient setting at home. ?-Outpatient follow-up with PCP.. ? ?9.  Sinus bradycardia ?-Asymptomatic. ?-Heart rate improved with exertion. ?-No further work-up needed.  Outpatient follow-up with PCP. ? ?10.  Hyperbilirubinemia ?-Resolved. ? ?11.  Hypomagnesemia ?-Repleted. ? ?12.  Normocytic anemia ?-Likely dilutional due to IV fluids. ?-Anemia panel with iron level of 37, TIBC of 202, ferritin of 254, folate of 11.6, vitamin B12 649. ?-H&H remained stable. ?-Outpatient follow-up. ? ?13.  Adult failure to thrive/hypoalbuminemia ?-Likely secondary to progressive dementia. ?-Patient noted with low albumin levels. ?-It is noted that family reports poor oral intake at baseline. ?-Noted to have declined consideration for G-tube in the past. ?- [place]. [place] has discussed with patient's daughter, [name] regarding the progressive nature of dementia as she is likely nearing end-stage. ?-Palliative care consulted for assistance with goals of care/medical decision making and family conference done and and there is concern that she may not be eating given her gastric ulcer and history of H. pylori so GI was consulted for further evaluation and management.  ?-Patient was maintained on IV PPI twice daily during the hospitalization.  ?-  H. pylori stool antigen positive trial of treatment to see whether patient able to tolerate.   ?-Patient  noted per RN to be refusing medications. ?-Patient still with a poor appetite with poor oral intake eating approximately 10% of meals or less, however due to history of ulcer, positive H. pylori may have a compon

## 2022-03-06 NOTE — TOC Transition Note (Signed)
Transition of Care (TOC) - CM/SW Discharge Note ? ? ?Patient Details  ?Name: Monica Erickson ?MRN: [id number] ?Date of Birth: 1946/05/14 ? ?Transition of Care (TOC) CM/SW Contact:  ?Kermit Balo, RN ?Phone Number: ?03/06/2022, 10:36 AM ? ? ?Clinical Narrative:    ?Patient is discharging home with home health services through [facility]. She will also be followed by  ?[facility] for home palliative care. Information on the AVS.  ?Patients daughter to provide transport home.  ? ?Final next level of care: Home w Home Health Services ?Barriers to Discharge: No Barriers Identified ? ? ?Patient Goals and CMS Choice ?  ?CMS Medicare.gov Compare Post Acute Care list provided to:: Patient Represenative (must comment) ?Choice offered to / list presented to : Adult Children ? ?Discharge Placement ?  ?           ?  ?  ?  ?  ? ?Discharge Plan and Services ?  ?  ?           ?  ?  ?  ?  ?  ?HH Arranged: RN, Nurse's Aide, PT, OT, Social Work ?HH Agency: [facility] ?Date HH Agency Contacted: 03/06/22 ?  ?Representative spoke with at [facility] Agency: [facility] Grippe ? ?Social Determinants of Health (SDOH) Interventions ?  ? ? ?Readmission Risk Interventions ?   ? View : No data to display.  ?  ?  ?  ? ? ? ? ? ?

## 2022-03-06 NOTE — Progress Notes (Signed)
[facility] ([facility] Hospital Liaison Note  Notified by TOC manager of patient/family request for [facility] palliative services at home after discharge.   [facility] hospital liaison will follow patient for discharge disposition.   Please call with any hospice or outpatient palliative care related questions.   Thank you for the opportunity to participate in this patient's care.   Shanita Wicker, LCSW [facility] Hospital Liaison [phone]  

## 2022-03-07 ENCOUNTER — Other Ambulatory Visit (HOSPITAL_COMMUNITY): Payer: Self-pay

## 2022-03-07 ENCOUNTER — Telehealth: Payer: Self-pay

## 2022-03-09 DIAGNOSIS — Z87440 Personal history of urinary (tract) infections: Secondary | ICD-10-CM | POA: Diagnosis not present

## 2022-03-09 DIAGNOSIS — E8809 Other disorders of plasma-protein metabolism, not elsewhere classified: Secondary | ICD-10-CM | POA: Diagnosis not present

## 2022-03-09 DIAGNOSIS — G4730 Sleep apnea, unspecified: Secondary | ICD-10-CM | POA: Diagnosis not present

## 2022-03-09 DIAGNOSIS — K259 Gastric ulcer, unspecified as acute or chronic, without hemorrhage or perforation: Secondary | ICD-10-CM | POA: Diagnosis not present

## 2022-03-09 DIAGNOSIS — D649 Anemia, unspecified: Secondary | ICD-10-CM | POA: Diagnosis not present

## 2022-03-09 DIAGNOSIS — Z7901 Long term (current) use of anticoagulants: Secondary | ICD-10-CM | POA: Diagnosis not present

## 2022-03-09 DIAGNOSIS — G309 Alzheimer's disease, unspecified: Secondary | ICD-10-CM | POA: Diagnosis not present

## 2022-03-09 DIAGNOSIS — F0283 Dementia in other diseases classified elsewhere, unspecified severity, with mood disturbance: Secondary | ICD-10-CM | POA: Diagnosis not present

## 2022-03-09 DIAGNOSIS — R519 Headache, unspecified: Secondary | ICD-10-CM | POA: Diagnosis not present

## 2022-03-09 DIAGNOSIS — F32A Depression, unspecified: Secondary | ICD-10-CM | POA: Diagnosis not present

## 2022-03-09 DIAGNOSIS — R627 Adult failure to thrive: Secondary | ICD-10-CM | POA: Diagnosis not present

## 2022-03-09 DIAGNOSIS — R001 Bradycardia, unspecified: Secondary | ICD-10-CM | POA: Diagnosis not present

## 2022-03-09 DIAGNOSIS — Z9181 History of falling: Secondary | ICD-10-CM | POA: Diagnosis not present

## 2022-03-09 DIAGNOSIS — B9681 Helicobacter pylori [H. pylori] as the cause of diseases classified elsewhere: Secondary | ICD-10-CM | POA: Diagnosis not present

## 2022-03-09 DIAGNOSIS — Z87891 Personal history of nicotine dependence: Secondary | ICD-10-CM | POA: Diagnosis not present

## 2022-03-09 DIAGNOSIS — I2694 Multiple subsegmental pulmonary emboli without acute cor pulmonale: Secondary | ICD-10-CM | POA: Diagnosis not present

## 2022-03-11 DIAGNOSIS — F32A Depression, unspecified: Secondary | ICD-10-CM | POA: Diagnosis not present

## 2022-03-11 DIAGNOSIS — Z87440 Personal history of urinary (tract) infections: Secondary | ICD-10-CM | POA: Diagnosis not present

## 2022-03-11 DIAGNOSIS — F0283 Dementia in other diseases classified elsewhere, unspecified severity, with mood disturbance: Secondary | ICD-10-CM | POA: Diagnosis not present

## 2022-03-11 DIAGNOSIS — E8809 Other disorders of plasma-protein metabolism, not elsewhere classified: Secondary | ICD-10-CM | POA: Diagnosis not present

## 2022-03-11 DIAGNOSIS — Z7901 Long term (current) use of anticoagulants: Secondary | ICD-10-CM | POA: Diagnosis not present

## 2022-03-11 DIAGNOSIS — R519 Headache, unspecified: Secondary | ICD-10-CM | POA: Diagnosis not present

## 2022-03-11 DIAGNOSIS — B9681 Helicobacter pylori [H. pylori] as the cause of diseases classified elsewhere: Secondary | ICD-10-CM | POA: Diagnosis not present

## 2022-03-11 DIAGNOSIS — I2694 Multiple subsegmental pulmonary emboli without acute cor pulmonale: Secondary | ICD-10-CM | POA: Diagnosis not present

## 2022-03-11 DIAGNOSIS — D649 Anemia, unspecified: Secondary | ICD-10-CM | POA: Diagnosis not present

## 2022-03-11 DIAGNOSIS — R627 Adult failure to thrive: Secondary | ICD-10-CM | POA: Diagnosis not present

## 2022-03-11 DIAGNOSIS — Z87891 Personal history of nicotine dependence: Secondary | ICD-10-CM | POA: Diagnosis not present

## 2022-03-11 DIAGNOSIS — K259 Gastric ulcer, unspecified as acute or chronic, without hemorrhage or perforation: Secondary | ICD-10-CM | POA: Diagnosis not present

## 2022-03-11 DIAGNOSIS — R001 Bradycardia, unspecified: Secondary | ICD-10-CM | POA: Diagnosis not present

## 2022-03-11 DIAGNOSIS — G4730 Sleep apnea, unspecified: Secondary | ICD-10-CM | POA: Diagnosis not present

## 2022-03-11 DIAGNOSIS — Z9181 History of falling: Secondary | ICD-10-CM | POA: Diagnosis not present

## 2022-03-11 DIAGNOSIS — G309 Alzheimer's disease, unspecified: Secondary | ICD-10-CM | POA: Diagnosis not present

## 2022-03-13 ENCOUNTER — Telehealth: Payer: Self-pay

## 2022-03-13 NOTE — Telephone Encounter (Signed)
Spoke with patient's daughter [name] and scheduled a Mychart Palliative Consult for 03/27/22 @ 2:30 PM per daughter's request d/t vacation. ? ?Consent obtained; updated Netsmart, Team List and Epic.  ? ?

## 2022-03-14 ENCOUNTER — Telehealth: Payer: Self-pay

## 2022-03-14 NOTE — Telephone Encounter (Signed)
No additional notes needed  

## 2022-03-18 DIAGNOSIS — Z7901 Long term (current) use of anticoagulants: Secondary | ICD-10-CM | POA: Diagnosis not present

## 2022-03-18 DIAGNOSIS — B9681 Helicobacter pylori [H. pylori] as the cause of diseases classified elsewhere: Secondary | ICD-10-CM | POA: Diagnosis not present

## 2022-03-18 DIAGNOSIS — G4730 Sleep apnea, unspecified: Secondary | ICD-10-CM | POA: Diagnosis not present

## 2022-03-18 DIAGNOSIS — R627 Adult failure to thrive: Secondary | ICD-10-CM | POA: Diagnosis not present

## 2022-03-18 DIAGNOSIS — F0283 Dementia in other diseases classified elsewhere, unspecified severity, with mood disturbance: Secondary | ICD-10-CM | POA: Diagnosis not present

## 2022-03-18 DIAGNOSIS — Z9181 History of falling: Secondary | ICD-10-CM | POA: Diagnosis not present

## 2022-03-18 DIAGNOSIS — R519 Headache, unspecified: Secondary | ICD-10-CM | POA: Diagnosis not present

## 2022-03-18 DIAGNOSIS — F32A Depression, unspecified: Secondary | ICD-10-CM | POA: Diagnosis not present

## 2022-03-18 DIAGNOSIS — E8809 Other disorders of plasma-protein metabolism, not elsewhere classified: Secondary | ICD-10-CM | POA: Diagnosis not present

## 2022-03-18 DIAGNOSIS — Z87440 Personal history of urinary (tract) infections: Secondary | ICD-10-CM | POA: Diagnosis not present

## 2022-03-18 DIAGNOSIS — K259 Gastric ulcer, unspecified as acute or chronic, without hemorrhage or perforation: Secondary | ICD-10-CM | POA: Diagnosis not present

## 2022-03-18 DIAGNOSIS — G309 Alzheimer's disease, unspecified: Secondary | ICD-10-CM | POA: Diagnosis not present

## 2022-03-18 DIAGNOSIS — R001 Bradycardia, unspecified: Secondary | ICD-10-CM | POA: Diagnosis not present

## 2022-03-18 DIAGNOSIS — D649 Anemia, unspecified: Secondary | ICD-10-CM | POA: Diagnosis not present

## 2022-03-18 DIAGNOSIS — Z87891 Personal history of nicotine dependence: Secondary | ICD-10-CM | POA: Diagnosis not present

## 2022-03-18 DIAGNOSIS — I2694 Multiple subsegmental pulmonary emboli without acute cor pulmonale: Secondary | ICD-10-CM | POA: Diagnosis not present

## 2022-03-18 NOTE — Telephone Encounter (Signed)
No additional notes needed from CMA ?

## 2022-03-20 DIAGNOSIS — B9681 Helicobacter pylori [H. pylori] as the cause of diseases classified elsewhere: Secondary | ICD-10-CM | POA: Diagnosis not present

## 2022-03-20 DIAGNOSIS — R519 Headache, unspecified: Secondary | ICD-10-CM | POA: Diagnosis not present

## 2022-03-20 DIAGNOSIS — G4730 Sleep apnea, unspecified: Secondary | ICD-10-CM | POA: Diagnosis not present

## 2022-03-20 DIAGNOSIS — R627 Adult failure to thrive: Secondary | ICD-10-CM | POA: Diagnosis not present

## 2022-03-20 DIAGNOSIS — Z87891 Personal history of nicotine dependence: Secondary | ICD-10-CM | POA: Diagnosis not present

## 2022-03-20 DIAGNOSIS — Z9181 History of falling: Secondary | ICD-10-CM | POA: Diagnosis not present

## 2022-03-20 DIAGNOSIS — R001 Bradycardia, unspecified: Secondary | ICD-10-CM | POA: Diagnosis not present

## 2022-03-20 DIAGNOSIS — Z7901 Long term (current) use of anticoagulants: Secondary | ICD-10-CM | POA: Diagnosis not present

## 2022-03-20 DIAGNOSIS — K259 Gastric ulcer, unspecified as acute or chronic, without hemorrhage or perforation: Secondary | ICD-10-CM | POA: Diagnosis not present

## 2022-03-20 DIAGNOSIS — D649 Anemia, unspecified: Secondary | ICD-10-CM | POA: Diagnosis not present

## 2022-03-20 DIAGNOSIS — G309 Alzheimer's disease, unspecified: Secondary | ICD-10-CM | POA: Diagnosis not present

## 2022-03-20 DIAGNOSIS — I2694 Multiple subsegmental pulmonary emboli without acute cor pulmonale: Secondary | ICD-10-CM | POA: Diagnosis not present

## 2022-03-20 DIAGNOSIS — E8809 Other disorders of plasma-protein metabolism, not elsewhere classified: Secondary | ICD-10-CM | POA: Diagnosis not present

## 2022-03-20 DIAGNOSIS — F0283 Dementia in other diseases classified elsewhere, unspecified severity, with mood disturbance: Secondary | ICD-10-CM | POA: Diagnosis not present

## 2022-03-20 DIAGNOSIS — F32A Depression, unspecified: Secondary | ICD-10-CM | POA: Diagnosis not present

## 2022-03-20 DIAGNOSIS — Z87440 Personal history of urinary (tract) infections: Secondary | ICD-10-CM | POA: Diagnosis not present

## 2022-03-24 DIAGNOSIS — D649 Anemia, unspecified: Secondary | ICD-10-CM | POA: Diagnosis not present

## 2022-03-24 DIAGNOSIS — Z87440 Personal history of urinary (tract) infections: Secondary | ICD-10-CM | POA: Diagnosis not present

## 2022-03-24 DIAGNOSIS — Z87891 Personal history of nicotine dependence: Secondary | ICD-10-CM | POA: Diagnosis not present

## 2022-03-24 DIAGNOSIS — B9681 Helicobacter pylori [H. pylori] as the cause of diseases classified elsewhere: Secondary | ICD-10-CM | POA: Diagnosis not present

## 2022-03-24 DIAGNOSIS — I2694 Multiple subsegmental pulmonary emboli without acute cor pulmonale: Secondary | ICD-10-CM | POA: Diagnosis not present

## 2022-03-24 DIAGNOSIS — R519 Headache, unspecified: Secondary | ICD-10-CM | POA: Diagnosis not present

## 2022-03-24 DIAGNOSIS — K259 Gastric ulcer, unspecified as acute or chronic, without hemorrhage or perforation: Secondary | ICD-10-CM | POA: Diagnosis not present

## 2022-03-24 DIAGNOSIS — R001 Bradycardia, unspecified: Secondary | ICD-10-CM | POA: Diagnosis not present

## 2022-03-24 DIAGNOSIS — Z9181 History of falling: Secondary | ICD-10-CM | POA: Diagnosis not present

## 2022-03-24 DIAGNOSIS — G309 Alzheimer's disease, unspecified: Secondary | ICD-10-CM | POA: Diagnosis not present

## 2022-03-24 DIAGNOSIS — G4730 Sleep apnea, unspecified: Secondary | ICD-10-CM | POA: Diagnosis not present

## 2022-03-24 DIAGNOSIS — F0283 Dementia in other diseases classified elsewhere, unspecified severity, with mood disturbance: Secondary | ICD-10-CM | POA: Diagnosis not present

## 2022-03-24 DIAGNOSIS — R627 Adult failure to thrive: Secondary | ICD-10-CM | POA: Diagnosis not present

## 2022-03-24 DIAGNOSIS — F32A Depression, unspecified: Secondary | ICD-10-CM | POA: Diagnosis not present

## 2022-03-24 DIAGNOSIS — E8809 Other disorders of plasma-protein metabolism, not elsewhere classified: Secondary | ICD-10-CM | POA: Diagnosis not present

## 2022-03-24 DIAGNOSIS — Z7901 Long term (current) use of anticoagulants: Secondary | ICD-10-CM | POA: Diagnosis not present

## 2022-03-25 DIAGNOSIS — I2694 Multiple subsegmental pulmonary emboli without acute cor pulmonale: Secondary | ICD-10-CM | POA: Diagnosis not present

## 2022-03-25 DIAGNOSIS — G309 Alzheimer's disease, unspecified: Secondary | ICD-10-CM | POA: Diagnosis not present

## 2022-03-25 DIAGNOSIS — G4730 Sleep apnea, unspecified: Secondary | ICD-10-CM | POA: Diagnosis not present

## 2022-03-25 DIAGNOSIS — K259 Gastric ulcer, unspecified as acute or chronic, without hemorrhage or perforation: Secondary | ICD-10-CM | POA: Diagnosis not present

## 2022-03-25 DIAGNOSIS — E8809 Other disorders of plasma-protein metabolism, not elsewhere classified: Secondary | ICD-10-CM | POA: Diagnosis not present

## 2022-03-25 DIAGNOSIS — Z9181 History of falling: Secondary | ICD-10-CM | POA: Diagnosis not present

## 2022-03-25 DIAGNOSIS — D649 Anemia, unspecified: Secondary | ICD-10-CM | POA: Diagnosis not present

## 2022-03-25 DIAGNOSIS — R001 Bradycardia, unspecified: Secondary | ICD-10-CM | POA: Diagnosis not present

## 2022-03-25 DIAGNOSIS — F0283 Dementia in other diseases classified elsewhere, unspecified severity, with mood disturbance: Secondary | ICD-10-CM | POA: Diagnosis not present

## 2022-03-25 DIAGNOSIS — Z87440 Personal history of urinary (tract) infections: Secondary | ICD-10-CM | POA: Diagnosis not present

## 2022-03-25 DIAGNOSIS — F32A Depression, unspecified: Secondary | ICD-10-CM | POA: Diagnosis not present

## 2022-03-25 DIAGNOSIS — Z87891 Personal history of nicotine dependence: Secondary | ICD-10-CM | POA: Diagnosis not present

## 2022-03-25 DIAGNOSIS — R519 Headache, unspecified: Secondary | ICD-10-CM | POA: Diagnosis not present

## 2022-03-25 DIAGNOSIS — B9681 Helicobacter pylori [H. pylori] as the cause of diseases classified elsewhere: Secondary | ICD-10-CM | POA: Diagnosis not present

## 2022-03-25 DIAGNOSIS — R627 Adult failure to thrive: Secondary | ICD-10-CM | POA: Diagnosis not present

## 2022-03-25 DIAGNOSIS — I2699 Other pulmonary embolism without acute cor pulmonale: Secondary | ICD-10-CM | POA: Diagnosis not present

## 2022-03-25 DIAGNOSIS — Z7901 Long term (current) use of anticoagulants: Secondary | ICD-10-CM | POA: Diagnosis not present

## 2022-03-27 ENCOUNTER — Telehealth: Payer: Medicare Other | Admitting: Hospice

## 2022-03-27 DIAGNOSIS — E8809 Other disorders of plasma-protein metabolism, not elsewhere classified: Secondary | ICD-10-CM | POA: Diagnosis not present

## 2022-03-27 DIAGNOSIS — D649 Anemia, unspecified: Secondary | ICD-10-CM | POA: Diagnosis not present

## 2022-03-27 DIAGNOSIS — Z87891 Personal history of nicotine dependence: Secondary | ICD-10-CM | POA: Diagnosis not present

## 2022-03-27 DIAGNOSIS — Z87440 Personal history of urinary (tract) infections: Secondary | ICD-10-CM | POA: Diagnosis not present

## 2022-03-27 DIAGNOSIS — Z7901 Long term (current) use of anticoagulants: Secondary | ICD-10-CM | POA: Diagnosis not present

## 2022-03-27 DIAGNOSIS — F32A Depression, unspecified: Secondary | ICD-10-CM | POA: Diagnosis not present

## 2022-03-27 DIAGNOSIS — F0283 Dementia in other diseases classified elsewhere, unspecified severity, with mood disturbance: Secondary | ICD-10-CM | POA: Diagnosis not present

## 2022-03-27 DIAGNOSIS — B9681 Helicobacter pylori [H. pylori] as the cause of diseases classified elsewhere: Secondary | ICD-10-CM | POA: Diagnosis not present

## 2022-03-27 DIAGNOSIS — K259 Gastric ulcer, unspecified as acute or chronic, without hemorrhage or perforation: Secondary | ICD-10-CM | POA: Diagnosis not present

## 2022-03-27 DIAGNOSIS — I2694 Multiple subsegmental pulmonary emboli without acute cor pulmonale: Secondary | ICD-10-CM | POA: Diagnosis not present

## 2022-03-27 DIAGNOSIS — G4730 Sleep apnea, unspecified: Secondary | ICD-10-CM | POA: Diagnosis not present

## 2022-03-27 DIAGNOSIS — R627 Adult failure to thrive: Secondary | ICD-10-CM | POA: Diagnosis not present

## 2022-03-27 DIAGNOSIS — G309 Alzheimer's disease, unspecified: Secondary | ICD-10-CM | POA: Diagnosis not present

## 2022-03-27 DIAGNOSIS — R001 Bradycardia, unspecified: Secondary | ICD-10-CM | POA: Diagnosis not present

## 2022-03-27 DIAGNOSIS — Z9181 History of falling: Secondary | ICD-10-CM | POA: Diagnosis not present

## 2022-03-27 DIAGNOSIS — R519 Headache, unspecified: Secondary | ICD-10-CM | POA: Diagnosis not present

## 2022-04-01 DIAGNOSIS — K259 Gastric ulcer, unspecified as acute or chronic, without hemorrhage or perforation: Secondary | ICD-10-CM | POA: Diagnosis not present

## 2022-04-01 DIAGNOSIS — B9681 Helicobacter pylori [H. pylori] as the cause of diseases classified elsewhere: Secondary | ICD-10-CM | POA: Diagnosis not present

## 2022-04-01 DIAGNOSIS — Z87891 Personal history of nicotine dependence: Secondary | ICD-10-CM | POA: Diagnosis not present

## 2022-04-01 DIAGNOSIS — E8809 Other disorders of plasma-protein metabolism, not elsewhere classified: Secondary | ICD-10-CM | POA: Diagnosis not present

## 2022-04-01 DIAGNOSIS — R001 Bradycardia, unspecified: Secondary | ICD-10-CM | POA: Diagnosis not present

## 2022-04-01 DIAGNOSIS — Z7901 Long term (current) use of anticoagulants: Secondary | ICD-10-CM | POA: Diagnosis not present

## 2022-04-01 DIAGNOSIS — F32A Depression, unspecified: Secondary | ICD-10-CM | POA: Diagnosis not present

## 2022-04-01 DIAGNOSIS — R519 Headache, unspecified: Secondary | ICD-10-CM | POA: Diagnosis not present

## 2022-04-01 DIAGNOSIS — D649 Anemia, unspecified: Secondary | ICD-10-CM | POA: Diagnosis not present

## 2022-04-01 DIAGNOSIS — G309 Alzheimer's disease, unspecified: Secondary | ICD-10-CM | POA: Diagnosis not present

## 2022-04-01 DIAGNOSIS — G4730 Sleep apnea, unspecified: Secondary | ICD-10-CM | POA: Diagnosis not present

## 2022-04-01 DIAGNOSIS — Z87440 Personal history of urinary (tract) infections: Secondary | ICD-10-CM | POA: Diagnosis not present

## 2022-04-01 DIAGNOSIS — F0283 Dementia in other diseases classified elsewhere, unspecified severity, with mood disturbance: Secondary | ICD-10-CM | POA: Diagnosis not present

## 2022-04-01 DIAGNOSIS — I2694 Multiple subsegmental pulmonary emboli without acute cor pulmonale: Secondary | ICD-10-CM | POA: Diagnosis not present

## 2022-04-01 DIAGNOSIS — R627 Adult failure to thrive: Secondary | ICD-10-CM | POA: Diagnosis not present

## 2022-04-01 DIAGNOSIS — Z9181 History of falling: Secondary | ICD-10-CM | POA: Diagnosis not present

## 2022-04-02 DIAGNOSIS — G4730 Sleep apnea, unspecified: Secondary | ICD-10-CM | POA: Diagnosis not present

## 2022-04-02 DIAGNOSIS — E8809 Other disorders of plasma-protein metabolism, not elsewhere classified: Secondary | ICD-10-CM | POA: Diagnosis not present

## 2022-04-02 DIAGNOSIS — R001 Bradycardia, unspecified: Secondary | ICD-10-CM | POA: Diagnosis not present

## 2022-04-02 DIAGNOSIS — D649 Anemia, unspecified: Secondary | ICD-10-CM | POA: Diagnosis not present

## 2022-04-02 DIAGNOSIS — K259 Gastric ulcer, unspecified as acute or chronic, without hemorrhage or perforation: Secondary | ICD-10-CM | POA: Diagnosis not present

## 2022-04-02 DIAGNOSIS — R627 Adult failure to thrive: Secondary | ICD-10-CM | POA: Diagnosis not present

## 2022-04-02 DIAGNOSIS — F0283 Dementia in other diseases classified elsewhere, unspecified severity, with mood disturbance: Secondary | ICD-10-CM | POA: Diagnosis not present

## 2022-04-02 DIAGNOSIS — I2694 Multiple subsegmental pulmonary emboli without acute cor pulmonale: Secondary | ICD-10-CM | POA: Diagnosis not present

## 2022-04-02 DIAGNOSIS — Z87891 Personal history of nicotine dependence: Secondary | ICD-10-CM | POA: Diagnosis not present

## 2022-04-02 DIAGNOSIS — Z9181 History of falling: Secondary | ICD-10-CM | POA: Diagnosis not present

## 2022-04-02 DIAGNOSIS — Z87440 Personal history of urinary (tract) infections: Secondary | ICD-10-CM | POA: Diagnosis not present

## 2022-04-02 DIAGNOSIS — Z7901 Long term (current) use of anticoagulants: Secondary | ICD-10-CM | POA: Diagnosis not present

## 2022-04-02 DIAGNOSIS — B9681 Helicobacter pylori [H. pylori] as the cause of diseases classified elsewhere: Secondary | ICD-10-CM | POA: Diagnosis not present

## 2022-04-02 DIAGNOSIS — R519 Headache, unspecified: Secondary | ICD-10-CM | POA: Diagnosis not present

## 2022-04-02 DIAGNOSIS — G309 Alzheimer's disease, unspecified: Secondary | ICD-10-CM | POA: Diagnosis not present

## 2022-04-02 DIAGNOSIS — I2699 Other pulmonary embolism without acute cor pulmonale: Secondary | ICD-10-CM | POA: Diagnosis not present

## 2022-04-02 DIAGNOSIS — F32A Depression, unspecified: Secondary | ICD-10-CM | POA: Diagnosis not present

## 2022-04-04 DIAGNOSIS — F0283 Dementia in other diseases classified elsewhere, unspecified severity, with mood disturbance: Secondary | ICD-10-CM | POA: Diagnosis not present

## 2022-04-04 DIAGNOSIS — D649 Anemia, unspecified: Secondary | ICD-10-CM | POA: Diagnosis not present

## 2022-04-04 DIAGNOSIS — K259 Gastric ulcer, unspecified as acute or chronic, without hemorrhage or perforation: Secondary | ICD-10-CM | POA: Diagnosis not present

## 2022-04-04 DIAGNOSIS — Z87440 Personal history of urinary (tract) infections: Secondary | ICD-10-CM | POA: Diagnosis not present

## 2022-04-04 DIAGNOSIS — R001 Bradycardia, unspecified: Secondary | ICD-10-CM | POA: Diagnosis not present

## 2022-04-04 DIAGNOSIS — R519 Headache, unspecified: Secondary | ICD-10-CM | POA: Diagnosis not present

## 2022-04-04 DIAGNOSIS — G4730 Sleep apnea, unspecified: Secondary | ICD-10-CM | POA: Diagnosis not present

## 2022-04-04 DIAGNOSIS — Z87891 Personal history of nicotine dependence: Secondary | ICD-10-CM | POA: Diagnosis not present

## 2022-04-04 DIAGNOSIS — R627 Adult failure to thrive: Secondary | ICD-10-CM | POA: Diagnosis not present

## 2022-04-04 DIAGNOSIS — B9681 Helicobacter pylori [H. pylori] as the cause of diseases classified elsewhere: Secondary | ICD-10-CM | POA: Diagnosis not present

## 2022-04-04 DIAGNOSIS — I2694 Multiple subsegmental pulmonary emboli without acute cor pulmonale: Secondary | ICD-10-CM | POA: Diagnosis not present

## 2022-04-04 DIAGNOSIS — Z9181 History of falling: Secondary | ICD-10-CM | POA: Diagnosis not present

## 2022-04-04 DIAGNOSIS — Z7901 Long term (current) use of anticoagulants: Secondary | ICD-10-CM | POA: Diagnosis not present

## 2022-04-04 DIAGNOSIS — F32A Depression, unspecified: Secondary | ICD-10-CM | POA: Diagnosis not present

## 2022-04-04 DIAGNOSIS — G309 Alzheimer's disease, unspecified: Secondary | ICD-10-CM | POA: Diagnosis not present

## 2022-04-04 DIAGNOSIS — E8809 Other disorders of plasma-protein metabolism, not elsewhere classified: Secondary | ICD-10-CM | POA: Diagnosis not present

## 2022-04-09 DIAGNOSIS — G309 Alzheimer's disease, unspecified: Secondary | ICD-10-CM | POA: Diagnosis not present

## 2022-04-09 DIAGNOSIS — R001 Bradycardia, unspecified: Secondary | ICD-10-CM | POA: Diagnosis not present

## 2022-04-09 DIAGNOSIS — E8809 Other disorders of plasma-protein metabolism, not elsewhere classified: Secondary | ICD-10-CM | POA: Diagnosis not present

## 2022-04-09 DIAGNOSIS — K259 Gastric ulcer, unspecified as acute or chronic, without hemorrhage or perforation: Secondary | ICD-10-CM | POA: Diagnosis not present

## 2022-04-09 DIAGNOSIS — B9681 Helicobacter pylori [H. pylori] as the cause of diseases classified elsewhere: Secondary | ICD-10-CM | POA: Diagnosis not present

## 2022-04-09 DIAGNOSIS — G4730 Sleep apnea, unspecified: Secondary | ICD-10-CM | POA: Diagnosis not present

## 2022-04-09 DIAGNOSIS — I2694 Multiple subsegmental pulmonary emboli without acute cor pulmonale: Secondary | ICD-10-CM | POA: Diagnosis not present

## 2022-04-09 DIAGNOSIS — F32A Depression, unspecified: Secondary | ICD-10-CM | POA: Diagnosis not present

## 2022-04-09 DIAGNOSIS — F0283 Dementia in other diseases classified elsewhere, unspecified severity, with mood disturbance: Secondary | ICD-10-CM | POA: Diagnosis not present

## 2022-04-09 DIAGNOSIS — Z7901 Long term (current) use of anticoagulants: Secondary | ICD-10-CM | POA: Diagnosis not present

## 2022-04-09 DIAGNOSIS — R519 Headache, unspecified: Secondary | ICD-10-CM | POA: Diagnosis not present

## 2022-04-09 DIAGNOSIS — Z87440 Personal history of urinary (tract) infections: Secondary | ICD-10-CM | POA: Diagnosis not present

## 2022-04-09 DIAGNOSIS — Z9181 History of falling: Secondary | ICD-10-CM | POA: Diagnosis not present

## 2022-04-09 DIAGNOSIS — D649 Anemia, unspecified: Secondary | ICD-10-CM | POA: Diagnosis not present

## 2022-04-09 DIAGNOSIS — R627 Adult failure to thrive: Secondary | ICD-10-CM | POA: Diagnosis not present

## 2022-04-09 DIAGNOSIS — Z87891 Personal history of nicotine dependence: Secondary | ICD-10-CM | POA: Diagnosis not present

## 2022-04-10 ENCOUNTER — Telehealth: Payer: Self-pay

## 2022-04-10 DIAGNOSIS — R001 Bradycardia, unspecified: Secondary | ICD-10-CM | POA: Diagnosis not present

## 2022-04-10 DIAGNOSIS — B9681 Helicobacter pylori [H. pylori] as the cause of diseases classified elsewhere: Secondary | ICD-10-CM | POA: Diagnosis not present

## 2022-04-10 DIAGNOSIS — D649 Anemia, unspecified: Secondary | ICD-10-CM | POA: Diagnosis not present

## 2022-04-10 DIAGNOSIS — R627 Adult failure to thrive: Secondary | ICD-10-CM | POA: Diagnosis not present

## 2022-04-10 DIAGNOSIS — G4730 Sleep apnea, unspecified: Secondary | ICD-10-CM | POA: Diagnosis not present

## 2022-04-10 DIAGNOSIS — F0283 Dementia in other diseases classified elsewhere, unspecified severity, with mood disturbance: Secondary | ICD-10-CM | POA: Diagnosis not present

## 2022-04-10 DIAGNOSIS — K259 Gastric ulcer, unspecified as acute or chronic, without hemorrhage or perforation: Secondary | ICD-10-CM | POA: Diagnosis not present

## 2022-04-10 DIAGNOSIS — I2694 Multiple subsegmental pulmonary emboli without acute cor pulmonale: Secondary | ICD-10-CM | POA: Diagnosis not present

## 2022-04-10 DIAGNOSIS — Z7901 Long term (current) use of anticoagulants: Secondary | ICD-10-CM | POA: Diagnosis not present

## 2022-04-10 DIAGNOSIS — R519 Headache, unspecified: Secondary | ICD-10-CM | POA: Diagnosis not present

## 2022-04-10 DIAGNOSIS — F32A Depression, unspecified: Secondary | ICD-10-CM | POA: Diagnosis not present

## 2022-04-10 DIAGNOSIS — Z87440 Personal history of urinary (tract) infections: Secondary | ICD-10-CM | POA: Diagnosis not present

## 2022-04-10 DIAGNOSIS — Z87891 Personal history of nicotine dependence: Secondary | ICD-10-CM | POA: Diagnosis not present

## 2022-04-10 DIAGNOSIS — Z9181 History of falling: Secondary | ICD-10-CM | POA: Diagnosis not present

## 2022-04-10 DIAGNOSIS — E8809 Other disorders of plasma-protein metabolism, not elsewhere classified: Secondary | ICD-10-CM | POA: Diagnosis not present

## 2022-04-10 DIAGNOSIS — G309 Alzheimer's disease, unspecified: Secondary | ICD-10-CM | POA: Diagnosis not present

## 2022-04-10 NOTE — Telephone Encounter (Signed)
Spoke with patient's daughter [name] regarding rescheduling Palliative Care consult. She states patient is currently under Palliative Care. She was unsure of the company name but states she also receives therapy from the same company. Will cancel referral and notify referring provider.

## 2022-04-12 DIAGNOSIS — Z9181 History of falling: Secondary | ICD-10-CM | POA: Diagnosis not present

## 2022-04-12 DIAGNOSIS — E8809 Other disorders of plasma-protein metabolism, not elsewhere classified: Secondary | ICD-10-CM | POA: Diagnosis not present

## 2022-04-12 DIAGNOSIS — K259 Gastric ulcer, unspecified as acute or chronic, without hemorrhage or perforation: Secondary | ICD-10-CM | POA: Diagnosis not present

## 2022-04-12 DIAGNOSIS — Z87891 Personal history of nicotine dependence: Secondary | ICD-10-CM | POA: Diagnosis not present

## 2022-04-12 DIAGNOSIS — R627 Adult failure to thrive: Secondary | ICD-10-CM | POA: Diagnosis not present

## 2022-04-12 DIAGNOSIS — F0283 Dementia in other diseases classified elsewhere, unspecified severity, with mood disturbance: Secondary | ICD-10-CM | POA: Diagnosis not present

## 2022-04-12 DIAGNOSIS — I2694 Multiple subsegmental pulmonary emboli without acute cor pulmonale: Secondary | ICD-10-CM | POA: Diagnosis not present

## 2022-04-12 DIAGNOSIS — D649 Anemia, unspecified: Secondary | ICD-10-CM | POA: Diagnosis not present

## 2022-04-12 DIAGNOSIS — R519 Headache, unspecified: Secondary | ICD-10-CM | POA: Diagnosis not present

## 2022-04-12 DIAGNOSIS — F32A Depression, unspecified: Secondary | ICD-10-CM | POA: Diagnosis not present

## 2022-04-12 DIAGNOSIS — B9681 Helicobacter pylori [H. pylori] as the cause of diseases classified elsewhere: Secondary | ICD-10-CM | POA: Diagnosis not present

## 2022-04-12 DIAGNOSIS — G309 Alzheimer's disease, unspecified: Secondary | ICD-10-CM | POA: Diagnosis not present

## 2022-04-12 DIAGNOSIS — Z7901 Long term (current) use of anticoagulants: Secondary | ICD-10-CM | POA: Diagnosis not present

## 2022-04-12 DIAGNOSIS — G4730 Sleep apnea, unspecified: Secondary | ICD-10-CM | POA: Diagnosis not present

## 2022-04-12 DIAGNOSIS — Z87440 Personal history of urinary (tract) infections: Secondary | ICD-10-CM | POA: Diagnosis not present

## 2022-04-12 DIAGNOSIS — R001 Bradycardia, unspecified: Secondary | ICD-10-CM | POA: Diagnosis not present

## 2022-04-14 DIAGNOSIS — Z9181 History of falling: Secondary | ICD-10-CM | POA: Diagnosis not present

## 2022-04-14 DIAGNOSIS — I2694 Multiple subsegmental pulmonary emboli without acute cor pulmonale: Secondary | ICD-10-CM | POA: Diagnosis not present

## 2022-04-14 DIAGNOSIS — E8809 Other disorders of plasma-protein metabolism, not elsewhere classified: Secondary | ICD-10-CM | POA: Diagnosis not present

## 2022-04-14 DIAGNOSIS — Z87440 Personal history of urinary (tract) infections: Secondary | ICD-10-CM | POA: Diagnosis not present

## 2022-04-14 DIAGNOSIS — Z87891 Personal history of nicotine dependence: Secondary | ICD-10-CM | POA: Diagnosis not present

## 2022-04-14 DIAGNOSIS — G309 Alzheimer's disease, unspecified: Secondary | ICD-10-CM | POA: Diagnosis not present

## 2022-04-14 DIAGNOSIS — R001 Bradycardia, unspecified: Secondary | ICD-10-CM | POA: Diagnosis not present

## 2022-04-14 DIAGNOSIS — G4730 Sleep apnea, unspecified: Secondary | ICD-10-CM | POA: Diagnosis not present

## 2022-04-14 DIAGNOSIS — Z7901 Long term (current) use of anticoagulants: Secondary | ICD-10-CM | POA: Diagnosis not present

## 2022-04-14 DIAGNOSIS — F0283 Dementia in other diseases classified elsewhere, unspecified severity, with mood disturbance: Secondary | ICD-10-CM | POA: Diagnosis not present

## 2022-04-14 DIAGNOSIS — D649 Anemia, unspecified: Secondary | ICD-10-CM | POA: Diagnosis not present

## 2022-04-14 DIAGNOSIS — K259 Gastric ulcer, unspecified as acute or chronic, without hemorrhage or perforation: Secondary | ICD-10-CM | POA: Diagnosis not present

## 2022-04-14 DIAGNOSIS — R627 Adult failure to thrive: Secondary | ICD-10-CM | POA: Diagnosis not present

## 2022-04-14 DIAGNOSIS — B9681 Helicobacter pylori [H. pylori] as the cause of diseases classified elsewhere: Secondary | ICD-10-CM | POA: Diagnosis not present

## 2022-04-14 DIAGNOSIS — R519 Headache, unspecified: Secondary | ICD-10-CM | POA: Diagnosis not present

## 2022-04-14 DIAGNOSIS — F32A Depression, unspecified: Secondary | ICD-10-CM | POA: Diagnosis not present

## 2022-04-16 DIAGNOSIS — D649 Anemia, unspecified: Secondary | ICD-10-CM | POA: Diagnosis not present

## 2022-04-16 DIAGNOSIS — F32A Depression, unspecified: Secondary | ICD-10-CM | POA: Diagnosis not present

## 2022-04-16 DIAGNOSIS — R519 Headache, unspecified: Secondary | ICD-10-CM | POA: Diagnosis not present

## 2022-04-16 DIAGNOSIS — Z7901 Long term (current) use of anticoagulants: Secondary | ICD-10-CM | POA: Diagnosis not present

## 2022-04-16 DIAGNOSIS — F0283 Dementia in other diseases classified elsewhere, unspecified severity, with mood disturbance: Secondary | ICD-10-CM | POA: Diagnosis not present

## 2022-04-16 DIAGNOSIS — R001 Bradycardia, unspecified: Secondary | ICD-10-CM | POA: Diagnosis not present

## 2022-04-16 DIAGNOSIS — I2694 Multiple subsegmental pulmonary emboli without acute cor pulmonale: Secondary | ICD-10-CM | POA: Diagnosis not present

## 2022-04-16 DIAGNOSIS — Z87891 Personal history of nicotine dependence: Secondary | ICD-10-CM | POA: Diagnosis not present

## 2022-04-16 DIAGNOSIS — R627 Adult failure to thrive: Secondary | ICD-10-CM | POA: Diagnosis not present

## 2022-04-16 DIAGNOSIS — B9681 Helicobacter pylori [H. pylori] as the cause of diseases classified elsewhere: Secondary | ICD-10-CM | POA: Diagnosis not present

## 2022-04-16 DIAGNOSIS — Z9181 History of falling: Secondary | ICD-10-CM | POA: Diagnosis not present

## 2022-04-16 DIAGNOSIS — K259 Gastric ulcer, unspecified as acute or chronic, without hemorrhage or perforation: Secondary | ICD-10-CM | POA: Diagnosis not present

## 2022-04-16 DIAGNOSIS — E8809 Other disorders of plasma-protein metabolism, not elsewhere classified: Secondary | ICD-10-CM | POA: Diagnosis not present

## 2022-04-16 DIAGNOSIS — Z87440 Personal history of urinary (tract) infections: Secondary | ICD-10-CM | POA: Diagnosis not present

## 2022-04-16 DIAGNOSIS — G4730 Sleep apnea, unspecified: Secondary | ICD-10-CM | POA: Diagnosis not present

## 2022-04-16 DIAGNOSIS — G309 Alzheimer's disease, unspecified: Secondary | ICD-10-CM | POA: Diagnosis not present

## 2022-04-23 DIAGNOSIS — F0283 Dementia in other diseases classified elsewhere, unspecified severity, with mood disturbance: Secondary | ICD-10-CM | POA: Diagnosis not present

## 2022-04-23 DIAGNOSIS — D649 Anemia, unspecified: Secondary | ICD-10-CM | POA: Diagnosis not present

## 2022-04-23 DIAGNOSIS — G309 Alzheimer's disease, unspecified: Secondary | ICD-10-CM | POA: Diagnosis not present

## 2022-04-23 DIAGNOSIS — K259 Gastric ulcer, unspecified as acute or chronic, without hemorrhage or perforation: Secondary | ICD-10-CM | POA: Diagnosis not present

## 2022-04-23 DIAGNOSIS — G4730 Sleep apnea, unspecified: Secondary | ICD-10-CM | POA: Diagnosis not present

## 2022-04-23 DIAGNOSIS — Z87440 Personal history of urinary (tract) infections: Secondary | ICD-10-CM | POA: Diagnosis not present

## 2022-04-23 DIAGNOSIS — R627 Adult failure to thrive: Secondary | ICD-10-CM | POA: Diagnosis not present

## 2022-04-23 DIAGNOSIS — R001 Bradycardia, unspecified: Secondary | ICD-10-CM | POA: Diagnosis not present

## 2022-04-23 DIAGNOSIS — Z87891 Personal history of nicotine dependence: Secondary | ICD-10-CM | POA: Diagnosis not present

## 2022-04-23 DIAGNOSIS — B9681 Helicobacter pylori [H. pylori] as the cause of diseases classified elsewhere: Secondary | ICD-10-CM | POA: Diagnosis not present

## 2022-04-23 DIAGNOSIS — E8809 Other disorders of plasma-protein metabolism, not elsewhere classified: Secondary | ICD-10-CM | POA: Diagnosis not present

## 2022-04-23 DIAGNOSIS — R519 Headache, unspecified: Secondary | ICD-10-CM | POA: Diagnosis not present

## 2022-04-23 DIAGNOSIS — I2694 Multiple subsegmental pulmonary emboli without acute cor pulmonale: Secondary | ICD-10-CM | POA: Diagnosis not present

## 2022-04-23 DIAGNOSIS — Z9181 History of falling: Secondary | ICD-10-CM | POA: Diagnosis not present

## 2022-04-23 DIAGNOSIS — Z7901 Long term (current) use of anticoagulants: Secondary | ICD-10-CM | POA: Diagnosis not present

## 2022-04-23 DIAGNOSIS — F32A Depression, unspecified: Secondary | ICD-10-CM | POA: Diagnosis not present

## 2022-04-28 DIAGNOSIS — E8809 Other disorders of plasma-protein metabolism, not elsewhere classified: Secondary | ICD-10-CM | POA: Diagnosis not present

## 2022-04-28 DIAGNOSIS — I2694 Multiple subsegmental pulmonary emboli without acute cor pulmonale: Secondary | ICD-10-CM | POA: Diagnosis not present

## 2022-04-28 DIAGNOSIS — Z7901 Long term (current) use of anticoagulants: Secondary | ICD-10-CM | POA: Diagnosis not present

## 2022-04-28 DIAGNOSIS — R627 Adult failure to thrive: Secondary | ICD-10-CM | POA: Diagnosis not present

## 2022-04-28 DIAGNOSIS — R519 Headache, unspecified: Secondary | ICD-10-CM | POA: Diagnosis not present

## 2022-04-28 DIAGNOSIS — R001 Bradycardia, unspecified: Secondary | ICD-10-CM | POA: Diagnosis not present

## 2022-04-28 DIAGNOSIS — Z87440 Personal history of urinary (tract) infections: Secondary | ICD-10-CM | POA: Diagnosis not present

## 2022-04-28 DIAGNOSIS — Z87891 Personal history of nicotine dependence: Secondary | ICD-10-CM | POA: Diagnosis not present

## 2022-04-28 DIAGNOSIS — F32A Depression, unspecified: Secondary | ICD-10-CM | POA: Diagnosis not present

## 2022-04-28 DIAGNOSIS — D649 Anemia, unspecified: Secondary | ICD-10-CM | POA: Diagnosis not present

## 2022-04-28 DIAGNOSIS — G309 Alzheimer's disease, unspecified: Secondary | ICD-10-CM | POA: Diagnosis not present

## 2022-04-28 DIAGNOSIS — K259 Gastric ulcer, unspecified as acute or chronic, without hemorrhage or perforation: Secondary | ICD-10-CM | POA: Diagnosis not present

## 2022-04-28 DIAGNOSIS — F0283 Dementia in other diseases classified elsewhere, unspecified severity, with mood disturbance: Secondary | ICD-10-CM | POA: Diagnosis not present

## 2022-04-28 DIAGNOSIS — G4730 Sleep apnea, unspecified: Secondary | ICD-10-CM | POA: Diagnosis not present

## 2022-04-28 DIAGNOSIS — Z9181 History of falling: Secondary | ICD-10-CM | POA: Diagnosis not present

## 2022-04-28 DIAGNOSIS — B9681 Helicobacter pylori [H. pylori] as the cause of diseases classified elsewhere: Secondary | ICD-10-CM | POA: Diagnosis not present

## 2022-05-02 DIAGNOSIS — I2699 Other pulmonary embolism without acute cor pulmonale: Secondary | ICD-10-CM | POA: Diagnosis not present

## 2022-05-02 DIAGNOSIS — R627 Adult failure to thrive: Secondary | ICD-10-CM | POA: Diagnosis not present

## 2022-05-02 DIAGNOSIS — Z7901 Long term (current) use of anticoagulants: Secondary | ICD-10-CM | POA: Diagnosis not present

## 2022-05-07 DIAGNOSIS — I2694 Multiple subsegmental pulmonary emboli without acute cor pulmonale: Secondary | ICD-10-CM | POA: Diagnosis not present

## 2022-05-07 DIAGNOSIS — G309 Alzheimer's disease, unspecified: Secondary | ICD-10-CM | POA: Diagnosis not present

## 2022-05-07 DIAGNOSIS — R627 Adult failure to thrive: Secondary | ICD-10-CM | POA: Diagnosis not present

## 2022-05-07 DIAGNOSIS — Z87440 Personal history of urinary (tract) infections: Secondary | ICD-10-CM | POA: Diagnosis not present

## 2022-05-07 DIAGNOSIS — R519 Headache, unspecified: Secondary | ICD-10-CM | POA: Diagnosis not present

## 2022-05-07 DIAGNOSIS — R001 Bradycardia, unspecified: Secondary | ICD-10-CM | POA: Diagnosis not present

## 2022-05-07 DIAGNOSIS — E8809 Other disorders of plasma-protein metabolism, not elsewhere classified: Secondary | ICD-10-CM | POA: Diagnosis not present

## 2022-05-07 DIAGNOSIS — Z7901 Long term (current) use of anticoagulants: Secondary | ICD-10-CM | POA: Diagnosis not present

## 2022-05-07 DIAGNOSIS — B9681 Helicobacter pylori [H. pylori] as the cause of diseases classified elsewhere: Secondary | ICD-10-CM | POA: Diagnosis not present

## 2022-05-07 DIAGNOSIS — F32A Depression, unspecified: Secondary | ICD-10-CM | POA: Diagnosis not present

## 2022-05-07 DIAGNOSIS — K259 Gastric ulcer, unspecified as acute or chronic, without hemorrhage or perforation: Secondary | ICD-10-CM | POA: Diagnosis not present

## 2022-05-07 DIAGNOSIS — G4730 Sleep apnea, unspecified: Secondary | ICD-10-CM | POA: Diagnosis not present

## 2022-05-07 DIAGNOSIS — D649 Anemia, unspecified: Secondary | ICD-10-CM | POA: Diagnosis not present

## 2022-05-07 DIAGNOSIS — Z87891 Personal history of nicotine dependence: Secondary | ICD-10-CM | POA: Diagnosis not present

## 2022-05-07 DIAGNOSIS — F0283 Dementia in other diseases classified elsewhere, unspecified severity, with mood disturbance: Secondary | ICD-10-CM | POA: Diagnosis not present

## 2022-05-07 DIAGNOSIS — Z9181 History of falling: Secondary | ICD-10-CM | POA: Diagnosis not present

## 2022-05-16 ENCOUNTER — Ambulatory Visit: Payer: Self-pay | Admitting: Nurse Practitioner

## 2022-05-29 ENCOUNTER — Ambulatory Visit: Payer: Self-pay | Admitting: Nurse Practitioner

## 2022-06-30 DIAGNOSIS — F03918 Unspecified dementia, unspecified severity, with other behavioral disturbance: Secondary | ICD-10-CM | POA: Diagnosis not present

## 2022-07-07 DIAGNOSIS — R109 Unspecified abdominal pain: Secondary | ICD-10-CM | POA: Diagnosis not present

## 2022-07-07 DIAGNOSIS — E876 Hypokalemia: Secondary | ICD-10-CM | POA: Diagnosis not present

## 2022-07-07 DIAGNOSIS — R627 Adult failure to thrive: Secondary | ICD-10-CM | POA: Diagnosis not present

## 2022-07-07 DIAGNOSIS — F03918 Unspecified dementia, unspecified severity, with other behavioral disturbance: Secondary | ICD-10-CM | POA: Diagnosis not present

## 2022-07-09 DIAGNOSIS — K2100 Gastro-esophageal reflux disease with esophagitis, without bleeding: Secondary | ICD-10-CM | POA: Diagnosis not present

## 2022-08-06 DIAGNOSIS — F03918 Unspecified dementia, unspecified severity, with other behavioral disturbance: Secondary | ICD-10-CM | POA: Diagnosis not present

## 2022-09-10 DIAGNOSIS — F03911 Unspecified dementia, unspecified severity, with agitation: Secondary | ICD-10-CM | POA: Diagnosis not present

## 2022-10-14 DIAGNOSIS — R54 Age-related physical debility: Secondary | ICD-10-CM | POA: Diagnosis not present

## 2022-12-09 DIAGNOSIS — R54 Age-related physical debility: Secondary | ICD-10-CM | POA: Diagnosis not present

## 2023-01-01 ENCOUNTER — Other Ambulatory Visit: Payer: Self-pay

## 2023-01-27 ENCOUNTER — Emergency Department (HOSPITAL_COMMUNITY): Payer: Medicare Other

## 2023-01-27 ENCOUNTER — Other Ambulatory Visit: Payer: Self-pay

## 2023-01-27 ENCOUNTER — Emergency Department (HOSPITAL_COMMUNITY)
Admission: EM | Admit: 2023-01-27 | Discharge: 2023-01-28 | Disposition: A | Discharge status: Home / Self Care | Payer: Medicare Other | Attending: Emergency Medicine | Admitting: Emergency Medicine

## 2023-01-27 ENCOUNTER — Encounter (HOSPITAL_COMMUNITY): Payer: Self-pay

## 2023-01-27 DIAGNOSIS — F0390 Unspecified dementia without behavioral disturbance: Secondary | ICD-10-CM | POA: Diagnosis not present

## 2023-01-27 DIAGNOSIS — R1084 Generalized abdominal pain: Secondary | ICD-10-CM | POA: Diagnosis not present

## 2023-01-27 DIAGNOSIS — M545 Low back pain, unspecified: Secondary | ICD-10-CM | POA: Insufficient documentation

## 2023-01-27 LAB — BASIC METABOLIC PANEL
Anion gap: 8 (ref 5–15)
BUN: 13 mg/dL (ref 8–23)
CO2: 22 mmol/L (ref 22–32)
Calcium: 8.9 mg/dL (ref 8.9–10.3)
Chloride: 107 mmol/L (ref 98–111)
Creatinine, Ser: 0.91 mg/dL (ref 0.44–1.00)
GFR, Estimated: >60 mL/min (ref >60)
Glucose, Bld: 99 mg/dL (ref 70–99)
Potassium: 4.2 mmol/L (ref 3.5–5.1)
Sodium: 137 mmol/L (ref 135–145)

## 2023-01-27 LAB — URINALYSIS, ROUTINE W REFLEX MICROSCOPIC
Bilirubin Urine: NEGATIVE
Glucose, UA: NEGATIVE mg/dL
Hgb urine dipstick: NEGATIVE
Ketones, ur: NEGATIVE mg/dL
Leukocytes,Ua: NEGATIVE
Nitrite: NEGATIVE
Protein, ur: NEGATIVE mg/dL
Specific Gravity, Urine: 1.014 (ref 1.005–1.030)
pH: 5 (ref 5.0–8.0)

## 2023-01-27 LAB — CBC
HCT: 39.7 % (ref 36.0–46.0)
Hemoglobin: 12.8 g/dL (ref 12.0–15.0)
MCH: 30.2 pg (ref 26.0–34.0)
MCHC: 32.2 g/dL (ref 30.0–36.0)
MCV: 93.6 fL (ref 80.0–100.0)
Platelets: 193 10*3/uL (ref 150–400)
RBC: 4.24 MIL/uL (ref 3.87–5.11)
RDW: 14.3 % (ref 11.5–15.5)
WBC: 5.1 10*3/uL (ref 4.0–10.5)
nRBC: 0 % (ref 0.0–0.2)

## 2023-01-27 MED ORDER — FENTANYL CITRATE PF 50 MCG/ML IJ SOSY
25.0000 ug | PREFILLED_SYRINGE | Freq: Once | INTRAMUSCULAR | Status: AC
  Administered 2023-01-27: 25 ug via INTRAVENOUS
  Filled 2023-01-27: qty 1

## 2023-01-27 MED ORDER — LORAZEPAM 2 MG/ML IJ SOLN
1.0000 mg | Freq: Once | INTRAMUSCULAR | Status: AC
  Administered 2023-01-27: 1 mg via INTRAVENOUS

## 2023-01-27 MED ORDER — LIDOCAINE 5 % EX PTCH
1.0000 | MEDICATED_PATCH | CUTANEOUS | Status: DC
  Administered 2023-01-27: 1 via TRANSDERMAL
  Filled 2023-01-27: qty 1

## 2023-01-27 MED ORDER — ONDANSETRON HCL 4 MG/2ML IJ SOLN
4.0000 mg | Freq: Once | INTRAMUSCULAR | Status: AC
  Administered 2023-01-27: 4 mg via INTRAVENOUS
  Filled 2023-01-27: qty 2

## 2023-01-27 MED ORDER — LORAZEPAM 2 MG/ML IJ SOLN
INTRAMUSCULAR | Status: AC
  Filled 2023-01-27: qty 1

## 2023-01-27 MED ORDER — ACETAMINOPHEN 325 MG PO TABS
650.0000 mg | ORAL_TABLET | Freq: Once | ORAL | Status: AC
  Administered 2023-01-27: 650 mg via ORAL
  Filled 2023-01-27: qty 2

## 2023-01-27 NOTE — ED Provider Notes (Signed)
Ultrasound ED Peripheral IV (Provider)  Date/Time: 01/27/2023 6:59 PM  Performed by: Audley Hose, MD Authorized by: Audley Hose, MD   Procedure details:    Indications: multiple failed IV attempts and poor IV access     Skin Prep: chlorhexidine gluconate     Location:  Right AC   Angiocath:  20 G   Bedside Ultrasound Guided: Yes     Images: not archived     Patient tolerated procedure without complications: Yes     Dressing applied: Yes       Audley Hose, MD 02/12/23 1527

## 2023-01-27 NOTE — Discharge Instructions (Signed)
Please read and follow all provided instructions.  Your diagnoses today include:  1. Acute low back pain, unspecified back pain laterality, unspecified whether sciatica present   2. Generalized abdominal pain     Tests performed today include: Blood cell counts and platelets: were normal Kidney and liver function tests: was normal Urine test to look for infection: no infection CT of the abdomen pelvis as well as the bones in the lower back were negative other than some arthritis in the lower back Vital signs. See below for your results today.   Medications prescribed:  None  Please use over-the-counter NSAID medications (ibuprofen, naproxen) or Tylenol (acetaminophen) as directed on the packaging for pain -- as long as you do not have any reasons avoid these medications. Reasons to avoid NSAID medications include: weak kidneys, a history of bleeding in your stomach or gut, or uncontrolled high blood pressure or previous heart attack. Reasons to avoid Tylenol include: liver problems or ongoing alcohol use. Never take more than 4000mg  or 8 Extra strength Tylenol in a 24 hour period.     Take any prescribed medications only as directed.  Home care instructions:  Follow any educational materials contained in this packet.  Follow-up instructions: Please follow-up with your primary care provider in the next 2 days for further evaluation of your symptoms.    Return instructions:  SEEK IMMEDIATE MEDICAL ATTENTION IF: The pain does not go away or becomes severe  A temperature above 101F develops  Repeated vomiting occurs (multiple episodes)  The pain becomes localized to portions of the abdomen. The right side could possibly be appendicitis. In an adult, the left lower portion of the abdomen could be colitis or diverticulitis.  Blood is being passed in stools or vomit (bright red or black tarry stools)  You develop chest pain, difficulty breathing, dizziness or fainting, or become confused,  poorly responsive, or inconsolable (young children) If you have any other emergent concerns regarding your health  Additional Information: Abdominal (belly) pain can be caused by many things. Your caregiver performed an examination and possibly ordered blood/urine tests and imaging (CT scan, x-rays, ultrasound). Many cases can be observed and treated at home after initial evaluation in the emergency department. Even though you are being discharged home, abdominal pain can be unpredictable. Therefore, you need a repeated exam if your pain does not resolve, returns, or worsens. Most patients with abdominal pain don't have to be admitted to the hospital or have surgery, but serious problems like appendicitis and gallbladder attacks can start out as nonspecific pain. Many abdominal conditions cannot be diagnosed in one visit, so follow-up evaluations are very important.  Your vital signs today were: BP (!) 150/84   Pulse 60   Temp 98.5 F (36.9 C) (Oral)   Resp 20   Ht 5\' 8"  (1.727 m)   Wt 65.1 kg   SpO2 99%   BMI 21.82 kg/m  If your blood pressure (bp) was elevated above 135/85 this visit, please have this repeated by your doctor within one month. --------------

## 2023-01-27 NOTE — ED Notes (Signed)
ED Lab tech at the bedside to attempt for labs

## 2023-01-27 NOTE — ED Notes (Signed)
Lab states they are running urine test now

## 2023-01-27 NOTE — ED Triage Notes (Signed)
Pt's daughter states pt has lower back pain, HA. Pt's daughter states pt not having urinary issues. Per pt's daughter, pt has dementia.

## 2023-01-27 NOTE — ED Notes (Signed)
RN attempted to obtain labs. RN unsuccessful. Pts daughter at bedside states she does not want pt to have purwick and pt can walk to bathroom.

## 2023-01-27 NOTE — ED Provider Notes (Signed)
[facility] Provider Note   CSN: [id number] Arrival date & time: 01/27/23  1659     History  Chief Complaint  Patient presents with   Back Pain    Monica Erickson is a 77 y.o. female.  Patient with history of Alzheimer's dementia presents to the emergency department today with family.  They state that over the past 1 week patient has been complaining of increasing lower back pain.  She does have a history of UTI and family states she had very similar symptoms with a UTI which led to the hospitalization in the past.  Her urine is malodorous with a very strong odor.  No fevers.  Patient is eating well.  No diarrhea.  Today she was complaining about worsened back pain and appeared very uncomfortable.  Family states that her primary care called in a prescription for meloxicam.       Home Medications Prior to Admission medications   Medication Sig Start Date End Date Taking? Authorizing Provider  enoxaparin (LOVENOX) 100 MG/ML injection Inject 1 mL (100 mg total) into the skin daily. 03/07/22   Eugenie Filler, MD  fluticasone (FLONASE) 50 MCG/ACT nasal spray Place 2 sprays into both nostrils daily. Patient not taking: Reported on 02/13/2022 12/23/21   Vevelyn Francois, NP  folic acid (FOLVITE) 1 MG tablet Take 1 tablet (1 mg total) by mouth daily. 03/07/22   Eugenie Filler, MD  megestrol (MEGACE) 40 MG/ML suspension Take 5 mLs (200 mg total) by mouth daily. 03/07/22   Eugenie Filler, MD  mirtazapine (REMERON SOL-TAB) 15 MG disintegrating tablet Take 1 tablet (15 mg total) by mouth at bedtime. Patient not taking: Reported on 02/23/2022 12/23/21 03/23/22  Vevelyn Francois, NP  omeprazole (PRILOSEC) 40 MG capsule Take 1 capsule (40 mg total) by mouth 2 (two) times daily before a meal for 14 days. May open capsule and put on soft food. 03/06/22 03/21/22  Eugenie Filler, MD  simethicone (MYLICON) 0000000 MG chewable tablet Chew 125 mg by mouth every 6  (six) hours as needed for flatulence.    [provider]      Allergies    Patient has no known allergies.    Review of Systems   Review of Systems  Physical Exam Updated Vital Signs BP (!) 166/82 (BP Location: Right Arm)   Pulse 73   Temp 98.5 F (36.9 C) (Oral)   Resp (!) 23   Ht 5\' 8"  (1.727 m)   Wt 65.1 kg   SpO2 100%   BMI 21.82 kg/m   Physical Exam Vitals and nursing note reviewed.  Constitutional:      General: She is not in acute distress.    Appearance: She is well-developed.  HENT:     Head: Normocephalic and atraumatic.     Right Ear: External ear normal.     Left Ear: External ear normal.     Nose: Nose normal.  Eyes:     Conjunctiva/sclera: Conjunctivae normal.  Cardiovascular:     Rate and Rhythm: Normal rate and regular rhythm.     Heart sounds: No murmur heard. Pulmonary:     Effort: No respiratory distress.     Breath sounds: No wheezing, rhonchi or rales.  Abdominal:     Palpations: Abdomen is soft.     Tenderness: There is abdominal tenderness. There is no guarding or rebound.     Comments: Mild generalized tenderness, patient winces and pushes my  hands away with pushing on palpation.   Musculoskeletal:     Cervical back: Normal range of motion and neck supple.     Thoracic back: No bony tenderness.     Lumbar back: Tenderness present. No bony tenderness.     Right lower leg: No edema.     Left lower leg: No edema.     Comments: Generalized lower back tenderness, poorly localized.  Skin:    General: Skin is warm and dry.     Findings: No rash.  Neurological:     General: No focal deficit present.     Mental Status: She is alert. Mental status is at baseline.     Motor: No weakness.  Psychiatric:        Mood and Affect: Mood normal.     ED Results / Procedures / Treatments   Labs (all labs ordered are listed, but only abnormal results are displayed) Labs Reviewed  URINALYSIS, ROUTINE W REFLEX MICROSCOPIC  BASIC METABOLIC  PANEL  CBC    EKG None  Radiology CT ABDOMEN PELVIS WO CONTRAST  Result Date: 01/27/2023 CLINICAL DATA:  Acute abdominal pain. EXAM: CT ABDOMEN AND PELVIS WITHOUT CONTRAST TECHNIQUE: Multidetector CT imaging of the abdomen and pelvis was performed following the standard protocol without IV contrast. RADIATION DOSE REDUCTION: This exam was performed according to the departmental dose-optimization program which includes automated exposure control, adjustment of the mA and/or kV according to patient size and/or use of iterative reconstruction technique. COMPARISON:  02/23/2022 FINDINGS: Lower chest: Upper normal heart size. No acute basilar airspace disease. No pleural effusion. Hepatobiliary: Motion artifact limitations. No focal hepatic abnormality. Unremarkable appearance of the gallbladder allowing for motion. No biliary dilatation. Pancreas: No ductal dilatation or inflammation. Spleen: Normal in size without focal abnormality. Adrenals/Urinary Tract: No adrenal nodule. No hydronephrosis. Punctate nonobstructing stone in the lower pole of the left kidney. No perirenal inflammation allowing for motion artifact. Decompressed ureters. No bladder wall thickening. Stomach/Bowel: Unremarkable appearance of the stomach. There is no small bowel obstruction or inflammation. The appendix is not visualized. Moderate volume of stool throughout the colon. Sigmoid colonic redundancy. Prominent colonic diverticulosis without diverticulitis. Vascular/Lymphatic: Mild aorto bi-iliac atherosclerosis. No aneurysm. No bulky abdominopelvic adenopathy. Reproductive: Status post hysterectomy. No adnexal masses. Other: No ascites, free air or abdominopelvic collection. Small fat containing umbilical hernia again seen. Musculoskeletal: Bony under mineralization. Degenerative change in the spine and hips. There are no acute or suspicious osseous abnormalities. IMPRESSION: 1. No acute abnormality in the abdomen/pelvis. 2. Colonic  diverticulosis without diverticulitis. 3. Punctate nonobstructing left renal stone. Aortic Atherosclerosis (ICD10-I70.0). Electronically Signed   By: Keith Rake M.D.   On: 01/27/2023 22:55   CT L-SPINE NO CHARGE  Result Date: 01/27/2023 CLINICAL DATA:  Urinary difficulty. EXAM: CT LUMBAR SPINE WITHOUT CONTRAST TECHNIQUE: Multidetector CT imaging of the lumbar spine was performed without intravenous contrast administration. Multiplanar CT image reconstructions were also generated. RADIATION DOSE REDUCTION: This exam was performed according to the departmental dose-optimization program which includes automated exposure control, adjustment of the mA and/or kV according to patient size and/or use of iterative reconstruction technique. COMPARISON:  CT abdomen pelvis dated 01/27/2023. FINDINGS: Segmentation: 5 lumbar type vertebrae. Alignment: Normal. Vertebrae: No acute fracture.  Osteopenia. Paraspinal and other soft tissues: No paraspinal fluid collection. Disc levels: No acute findings. Multilevel degenerative changes with posterior disc bulge. IMPRESSION: 1. No acute/traumatic lumbar spine pathology. 2. Multilevel degenerative changes with posterior disc bulge. Electronically Signed   By: Anner Crete  M.D.   On: 01/27/2023 22:53    Procedures Procedures    Medications Ordered in ED Medications - No data to display  ED Course/ Medical Decision Making/ A&P    Patient seen and examined. History obtained directly from patient's daughter at bedside.   Labs/EKG: Ordered CBC, BMP, UA.  Imaging: Ordered CT abdomen pelvis, CT of the lumbar spine.  Medications/Fluids: None ordered  Most recent vital signs reviewed and are as follows: BP (!) 150/84   Pulse 60   Temp 98.5 F (36.9 C) (Oral)   Resp 20   Ht 5\' 8"  (1.727 m)   Wt 65.1 kg   SpO2 99%   BMI 21.82 kg/m   Initial impression: Patient with back pain, baseline dementia.  Concern for UTI per family.  Access has been an issue  with the patient, mainly due to patient cooperation with IV placement.  IV was started under ultrasound guidance by Dr. Mayra Neer, who has also seen patient.   Shortly afterwards, IV would not pull back for labs.  IV team consult was placed.  Will attempt CT imaging of the abdomen pelvis, lumbar spine.  Labs did return and were independently interpreted including: UA without signs of infection; CBC unremarkable; BMP unremarkable.  First attempt at CT imaging was unsuccessful due to patient cooperation.  Ativan was ordered.    Later I spoke with CT during second attempt.  CT concerned that current IV is infiltrated.  I requested that they attempt to obtain CT abdomen pelvis without contrast.  They will proceed.  Per RN, patient has ambulated to the restroom with assistance to obtain urine sample.   12:19 AM Reassessment performed. Patient appears stable.  Still awake and alert, after Ativan.  Personally reviewed and interpreted: CT abdomen and pelvis, agree no acute findings, CT of the lumbar spine, agree no compression fractures.  I called the patient's daughter and discussed reassuring workup to this point.  She is frustrated about not having an answer, but agrees that patient can go home.  She will come back to the hospital to pick her up.  Patient be given Lidoderm patch and Tylenol.  Most current vital signs reviewed and are as follows: BP 111/64   Pulse 62   Temp 98.5 F (36.9 C) (Oral)   Resp 16   Ht 5\' 8"  (1.727 m)   Wt 65.1 kg   SpO2 98%   BMI 21.82 kg/m   Plan: Discharge to home.   Prescriptions written for: None  Other home care instructions discussed: Rest, hydration, continued over-the-counter medications for pain.  ED return instructions discussed: Encouraged return with uncontrolled pain, fevers, new symptoms or changing symptoms.  Follow-up instructions discussed: Patient encouraged to follow-up with their PCP in 3 days for reevaluation.                               Medical Decision Making Amount and/or Complexity of Data Reviewed Labs: ordered. Radiology: ordered.  Risk OTC drugs. Prescription drug management.   Patient presented today for evaluation of lower back pain and strong odor of the urine.  Patient has been complaining of more pain and family was concerned that she was developing a UTI as her symptoms were similar when she developed a UTI in the past.  Patient looks well and is at her baseline otherwise.  She has dementia which makes obtaining a history difficult.  Patient was evaluated with lab testing which was  very reassuring.  No evidence of UTI on UA.  She was having some apparent tenderness to palpation of her abdomen.  For this reason we imaged her abdomen and lumbar spine.  No causes of pain identified.  No intra-abdominal infections noted.  No kidney stones or obstructions.  No significant vascular abnormalities.  Low concern for mesenteric ischemia currently.  Lumbar spine without fracture, does show some arthritis which is not unexpected.  Symptoms seem to be reasonably controlled in the ED.  No indications for admission at this time.  Family can monitor the patient at home.   The patient's vital signs, pertinent lab work and imaging were reviewed and interpreted as discussed in the ED course. Hospitalization was considered for further testing, treatments, or serial exams/observation. However as patient is well-appearing, has a stable exam, and reassuring studies today, I do not feel that they warrant admission at this time. This plan was discussed with the patient who verbalizes agreement and comfort with this plan and seems reliable and able to return to the Emergency Department with worsening or changing symptoms.          Final Clinical Impression(s) / ED Diagnoses Final diagnoses:  Acute low back pain, unspecified back pain laterality, unspecified whether sciatica present  Generalized abdominal pain    Rx / DC Orders ED  Discharge Orders     None         Carlisle Cater, PA-C 01/28/23 1453    Audley Hose, MD 02/04/23 [phone]

## 2023-01-27 NOTE — ED Notes (Signed)
Pt ambulatory to bathroom

## 2023-01-27 NOTE — Progress Notes (Signed)
Received consult for IV. Per flowsheet, pt currently has an IV. Secure chat sent to RN to verify if IV is still present. Review of chart does not show an indication for 2nd IV site. Awaiting response from RN.

## 2023-02-03 ENCOUNTER — Telehealth: Payer: Self-pay

## 2023-02-03 NOTE — Telephone Encounter (Signed)
     Patient  visit on 3/20  at [facility]    Have you been able to follow up with your primary care physician? Yes today. Daughter stated patient is doing worse and may be back to the ED   The patient was or was not able to obtain any needed medicine or equipment. Yes   Are there diet recommendations that you are having difficulty following? NA   Patient expresses understanding of discharge instructions and education provided has no other needs at this time.  [place], [place] [phone] [address], [place], [place] [address] Phone: [phone] Email: [email or web address]

## 2023-02-04 ENCOUNTER — Emergency Department (HOSPITAL_COMMUNITY): Payer: Medicare Other

## 2023-02-04 ENCOUNTER — Other Ambulatory Visit: Payer: Self-pay

## 2023-02-04 ENCOUNTER — Encounter (HOSPITAL_COMMUNITY): Payer: Self-pay

## 2023-02-04 ENCOUNTER — Emergency Department (HOSPITAL_COMMUNITY)
Admission: EM | Admit: 2023-02-04 | Discharge: 2023-02-04 | Disposition: A | Discharge status: Home / Self Care | Payer: Medicare Other | Attending: Emergency Medicine | Admitting: Emergency Medicine

## 2023-02-04 DIAGNOSIS — R109 Unspecified abdominal pain: Secondary | ICD-10-CM | POA: Insufficient documentation

## 2023-02-04 DIAGNOSIS — N200 Calculus of kidney: Secondary | ICD-10-CM | POA: Diagnosis not present

## 2023-02-04 DIAGNOSIS — F0390 Unspecified dementia without behavioral disturbance: Secondary | ICD-10-CM | POA: Diagnosis not present

## 2023-02-04 DIAGNOSIS — M545 Low back pain, unspecified: Secondary | ICD-10-CM | POA: Insufficient documentation

## 2023-02-04 DIAGNOSIS — E559 Vitamin D deficiency, unspecified: Secondary | ICD-10-CM | POA: Diagnosis not present

## 2023-02-04 LAB — URINALYSIS, ROUTINE W REFLEX MICROSCOPIC
Bacteria, UA: NONE SEEN
Bilirubin Urine: NEGATIVE
Glucose, UA: NEGATIVE mg/dL
Ketones, ur: 5 mg/dL — AB
Leukocytes,Ua: NEGATIVE
Nitrite: NEGATIVE
Protein, ur: 30 mg/dL — AB
Specific Gravity, Urine: 1.029 (ref 1.005–1.030)
pH: 5 (ref 5.0–8.0)

## 2023-02-04 LAB — COMPREHENSIVE METABOLIC PANEL
ALT: 9 U/L (ref 0–44)
AST: 16 U/L (ref 15–41)
Albumin: 4.5 g/dL (ref 3.5–5.0)
Alkaline Phosphatase: 87 U/L (ref 38–126)
Anion gap: 9 (ref 5–15)
BUN: 21 mg/dL (ref 8–23)
CO2: 24 mmol/L (ref 22–32)
Calcium: 9.3 mg/dL (ref 8.9–10.3)
Chloride: 109 mmol/L (ref 98–111)
Creatinine, Ser: 1.04 mg/dL — ABNORMAL HIGH (ref 0.44–1.00)
GFR, Estimated: 56 mL/min — ABNORMAL LOW (ref >60)
Glucose, Bld: 117 mg/dL — ABNORMAL HIGH (ref 70–99)
Potassium: 4.1 mmol/L (ref 3.5–5.1)
Sodium: 142 mmol/L (ref 135–145)
Total Bilirubin: 1.3 mg/dL — ABNORMAL HIGH (ref 0.3–1.2)
Total Protein: 8.4 g/dL — ABNORMAL HIGH (ref 6.5–8.1)

## 2023-02-04 LAB — CBC WITH DIFFERENTIAL/PLATELET
Abs Immature Granulocytes: 0.03 10*3/uL (ref 0.00–0.07)
Basophils Absolute: 0.1 10*3/uL (ref 0.0–0.1)
Basophils Relative: 1 %
Eosinophils Absolute: 0.1 10*3/uL (ref 0.0–0.5)
Eosinophils Relative: 1 %
HCT: 41.2 % (ref 36.0–46.0)
Hemoglobin: 13.2 g/dL (ref 12.0–15.0)
Immature Granulocytes: 0 %
Lymphocytes Relative: 24 %
Lymphs Abs: 1.7 10*3/uL (ref 0.7–4.0)
MCH: 30.2 pg (ref 26.0–34.0)
MCHC: 32 g/dL (ref 30.0–36.0)
MCV: 94.3 fL (ref 80.0–100.0)
Monocytes Absolute: 0.6 10*3/uL (ref 0.1–1.0)
Monocytes Relative: 8 %
Neutro Abs: 4.5 10*3/uL (ref 1.7–7.7)
Neutrophils Relative %: 66 %
Platelets: 224 10*3/uL (ref 150–400)
RBC: 4.37 MIL/uL (ref 3.87–5.11)
RDW: 14.6 % (ref 11.5–15.5)
WBC: 7 10*3/uL (ref 4.0–10.5)
nRBC: 0 % (ref 0.0–0.2)

## 2023-02-04 LAB — VITAMIN B12: Vitamin B-12: 175 pg/mL — ABNORMAL LOW (ref 180–914)

## 2023-02-04 LAB — VITAMIN D 25 HYDROXY (VIT D DEFICIENCY, FRACTURES): Vit D, 25-Hydroxy: 10.27 ng/mL — ABNORMAL LOW (ref 30–100)

## 2023-02-04 MED ORDER — LIDOCAINE 5 % EX PTCH
1.0000 | MEDICATED_PATCH | CUTANEOUS | Status: DC
  Administered 2023-02-04: 1 via TRANSDERMAL
  Filled 2023-02-04: qty 1

## 2023-02-04 MED ORDER — LORAZEPAM 2 MG/ML IJ SOLN: 1.0000 mg | Freq: Once | INTRAMUSCULAR | Status: DC | PRN

## 2023-02-04 MED ORDER — SODIUM CHLORIDE 0.9 % IV BOLUS
1000.0000 mL | Freq: Once | INTRAVENOUS | Status: AC
  Administered 2023-02-04: 1000 mL via INTRAVENOUS

## 2023-02-04 NOTE — Discharge Instructions (Addendum)
You were set up for [facility] services through [facility]. They will reach out to you to schedule a visit. If you have any questions or concerns regarding your Home Health, Malachy Mood will be your point of contact [phone]).

## 2023-02-04 NOTE — ED Notes (Signed)
Pt able to stand and ambulate with assistance. While walking pt does not display any sign of pain in back, or appear in distress from walking.

## 2023-02-04 NOTE — ED Triage Notes (Signed)
PT coming home by EMS. Called for lower back pain, denies any falls or injury. Pt recently seen for same complaint, but has not had relief from pain. HX of dementia.

## 2023-02-04 NOTE — ED Provider Notes (Addendum)
I have placed order for hospital bed for the patient at home.  She has significant mobility issues now started in appears to be due to chronic back pain.  She appears to have a lot of discomfort that occurs when she is sleeping in bed.  Hard for her to get up out of bed and she is having incontinence issues and urinating herself because she is having issues being able to get out of bed per family.  Ultimately I think being in the hospital bed will help family help mobilize her out of bed and help with chronic discomfort she has not helped with her mobility and incontinence issues.  This should also help her avoid any worsening bedsores.   Lennice Sites, DO 02/04/23 [place], [place], DO 02/04/23 1025

## 2023-02-04 NOTE — Progress Notes (Addendum)
Transition of Care [facility]) - Emergency Department Mini Assessment   Patient Details  Name: Monica Erickson MRN: [id number] Date of Birth: 01/06/1946  [facility] CM/SW Contact:    Monica Relic, LCSW Phone Number: 02/04/2023, 10:58 AM   Clinical Narrative: Pt presented to ED for back pain. Family is requesting HHPT/OT. Daughter reports they already have an 8 hours aide that comes into the home. Daughter states the pt constantly suffers from back pain and they are interested in a hospital bed and BSC. Case staffed with EDP, Dr. Ronnald Erickson and orders are in. Monica Erickson with [employer] was contacted to request DME. Pt was set up with Home Health PT/OT through [facility]. Information added to AVS.   Addend @ 12:09 PM Informed daughter that hospital bed will not be delivered today but agency will inform her of delivery date. Provided daughter with [facility] and DME agency names. Informed daughter that agency is unable to staff [facility]. Daughter verbalized understanding. Pt to transport home via [facility].    ED Mini Assessment: What brought you to the Emergency Department? : lower back pain  Barriers to Discharge: No Barriers Identified     Means of departure: Car       Patient Contact and Communications Key Contact 1: [name] - Monica Erickson   Spoke with: Monica Erickson Contact Date: 02/04/23,   Contact time: 1030 Contact Phone Number: [phone] Call outcome: Family requesting [facility] PT, hospital bed, and [facility]         Admission diagnosis:  Back Pain Abd Pain Patient Active Problem List   Diagnosis Date Noted   Pneumonia of right lung due to infectious organism    Normocytic anemia    H. pylori infection    Pulmonary embolism ([place]) 02/25/2022   Failure to thrive in adult 02/25/2022   Sepsis due to pneumonia ([place]) 02/25/2022   Hypokalemia 02/25/2022   AMS (altered mental status) 02/23/2022   Pseudophakia of both eyes 02/26/2021   Alzheimer's dementia ([place]) 09/24/2020   Generalized  abdominal pain 02/17/2020   Poor appetite 02/17/2020   Weight loss 02/17/2020   Constipation 02/17/2020   Other hyperlipidemia 02/17/2020   Belching 02/17/2020   Stone, kidney 12/07/2018   Dementia without behavioral disturbance ([place]) 12/07/2018   PCP:  Monica Dunk, NP (Inactive) Pharmacy:   [facility] ([place]), [place] - [address] [place] ([place]) [place] [address] Phone: [phone] Fax: [facility] [address] [place] [address] Phone: [phone] Fax: [phone]

## 2023-02-04 NOTE — ED Notes (Signed)
Pt placed under bed alarm and has grip socks on.

## 2023-02-04 NOTE — ED Provider Notes (Addendum)
[place] EMERGENCY DEPARTMENT AT [facility] Provider Note   CSN: [id number] Arrival date & time: 02/04/23  [id number]     History  Chief Complaint  Patient presents with   Back Pain    Monica Erickson is a 77 y.o. female.  Level 5 caveat due to dementia.  Family on the phone states that she was holding her head this morning and her abdomen and screaming in pain.  Seen here several days ago for similar.  She has a Doctor, hospital at home.  Does not use any assistive devices walking around.  She per family does not usually answer questions normally.  She denies any pain.  Patient denies any falls.  Thought that her urine is may be foul-smelling.  She seems to have discomfort when trying to walk.  The history is provided by the patient and a caregiver.       Home Medications Prior to Admission medications   Medication Sig Start Date End Date Taking? Authorizing Provider  enoxaparin (LOVENOX) 100 MG/ML injection Inject 1 mL (100 mg total) into the skin daily. 03/07/22   Eugenie Filler, MD  fluticasone (FLONASE) 50 MCG/ACT nasal spray Place 2 sprays into both nostrils daily. Patient not taking: Reported on 02/13/2022 12/23/21   Vevelyn Francois, NP  folic acid (FOLVITE) 1 MG tablet Take 1 tablet (1 mg total) by mouth daily. 03/07/22   Eugenie Filler, MD  megestrol (MEGACE) 40 MG/ML suspension Take 5 mLs (200 mg total) by mouth daily. 03/07/22   Eugenie Filler, MD  mirtazapine (REMERON SOL-TAB) 15 MG disintegrating tablet Take 1 tablet (15 mg total) by mouth at bedtime. Patient not taking: Reported on 02/23/2022 12/23/21 03/23/22  Vevelyn Francois, NP  omeprazole (PRILOSEC) 40 MG capsule Take 1 capsule (40 mg total) by mouth 2 (two) times daily before a meal for 14 days. May open capsule and put on soft food. 03/06/22 03/21/22  Eugenie Filler, MD  simethicone (MYLICON) 0000000 MG chewable tablet Chew 125 mg by mouth every 6 (six) hours as needed for flatulence.    [provider]       Allergies    Patient has no known allergies.    Review of Systems   Review of Systems  Physical Exam Updated Vital Signs BP (!) 166/93   Pulse 90   Temp 99.1 F (37.3 C) (Oral)   Resp 18   SpO2 99%  Physical Exam Vitals and nursing note reviewed.  Constitutional:      General: She is not in acute distress.    Appearance: She is well-developed. She is not ill-appearing.  HENT:     Head: Normocephalic and atraumatic.     Mouth/Throat:     Mouth: Mucous membranes are moist.  Eyes:     Extraocular Movements: Extraocular movements intact.     Conjunctiva/sclera: Conjunctivae normal.     Pupils: Pupils are equal, round, and reactive to light.  Cardiovascular:     Rate and Rhythm: Normal rate and regular rhythm.     Pulses: Normal pulses.     Heart sounds: Normal heart sounds. No murmur heard. Pulmonary:     Effort: Pulmonary effort is normal. No respiratory distress.     Breath sounds: Normal breath sounds.  Abdominal:     Palpations: Abdomen is soft.     Tenderness: There is no abdominal tenderness.  Musculoskeletal:        General: No swelling or tenderness. Normal range of motion.  Cervical back: Normal range of motion and neck supple.     Comments: No tenderness to any extremities, can range at her hips and knees and arms and shoulders without any discomfort  Skin:    General: Skin is warm and dry.     Capillary Refill: Capillary refill takes less than 2 seconds.  Neurological:     General: No focal deficit present.     Mental Status: She is alert.     Comments: Patient ambulatory in the room without any issues, she appears to have normal strength and sensation throughout, she is pleasant  Psychiatric:        Mood and Affect: Mood normal.     ED Results / Procedures / Treatments   Labs (all labs ordered are listed, but only abnormal results are displayed) Labs Reviewed  COMPREHENSIVE METABOLIC PANEL - Abnormal; Notable for the following components:       Result Value   Glucose, Bld 117 (*)    Creatinine, Ser 1.04 (*)    Total Protein 8.4 (*)    Total Bilirubin 1.3 (*)    GFR, Estimated 56 (*)    All other components within normal limits  URINALYSIS, ROUTINE W REFLEX MICROSCOPIC - Abnormal; Notable for the following components:   APPearance HAZY (*)    Hgb urine dipstick SMALL (*)    Ketones, ur 5 (*)    Protein, ur 30 (*)    All other components within normal limits  CBC WITH DIFFERENTIAL/PLATELET  VITAMIN B12  VITAMIN D 25 HYDROXY (VIT D DEFICIENCY, FRACTURES)    EKG EKG Interpretation  Date/Time:  Wednesday February 04 2023 08:32:54 EDT Ventricular Rate:  78 PR Interval:  177 QRS Duration: 93 QT Interval:  374 QTC Calculation: 426 R Axis:   63 Text Interpretation: Sinus rhythm Borderline low voltage, extremity leads Confirmed by Lennice Sites ([id number]) on 02/04/2023 8:35:24 AM  Radiology CT ABDOMEN PELVIS WO CONTRAST  Result Date: 02/04/2023 CLINICAL DATA:  Abdominal pain EXAM: CT ABDOMEN AND PELVIS WITHOUT CONTRAST TECHNIQUE: Multidetector CT imaging of the abdomen and pelvis was performed following the standard protocol without IV contrast. RADIATION DOSE REDUCTION: This exam was performed according to the departmental dose-optimization program which includes automated exposure control, adjustment of the mA and/or kV according to patient size and/or use of iterative reconstruction technique. COMPARISON:  CT abdomen and pelvis dated January 27, 2023 FINDINGS: Lower chest: Trace pericardial effusion.  No acute abnormality. Hepatobiliary: No focal liver abnormality is seen. No gallstones, gallbladder wall thickening, or biliary dilatation. Pancreas: Unremarkable. No pancreatic ductal dilatation or surrounding inflammatory changes. Spleen: Choose on Adrenals/Urinary Tract: Bilateral adrenal glands are unremarkable. No hydronephrosis. Punctate nonobstructing left renal stones. Bladder is unremarkable. Stomach/Bowel: Stomach is within normal  limits. Diverticulosis. No evidence of bowel wall thickening, distention, or inflammatory changes. Vascular/Lymphatic: Aortic atherosclerosis. No enlarged abdominal or pelvic lymph nodes. Reproductive: Status post hysterectomy. No adnexal masses. Other: No abdominal wall hernia or abnormality. No abdominopelvic ascites. Musculoskeletal: No acute or significant osseous findings. IMPRESSION: 1. No acute findings in the abdomen or pelvis. 2. Punctate nonobstructing left renal stones. 3. Diverticulosis without evidence of diverticulitis. 4. Aortic Atherosclerosis (ICD10-I70.0). Electronically Signed   By: Yetta Glassman M.D.   On: 02/04/2023 09:57   CT Head Wo Contrast  Result Date: 02/04/2023 CLINICAL DATA:  Mental status change of unknown etiology. EXAM: CT HEAD WITHOUT CONTRAST TECHNIQUE: Contiguous axial images were obtained from the base of the skull through the vertex without intravenous contrast. RADIATION  DOSE REDUCTION: This exam was performed according to the departmental dose-optimization program which includes automated exposure control, adjustment of the mA and/or kV according to patient size and/or use of iterative reconstruction technique. COMPARISON:  02/23/2022 FINDINGS: Brain: No evidence of acute infarction, hemorrhage, hydrocephalus, extra-axial collection or mass lesion/mass effect. Prominence of the sulci and ventricles compatible with brain atrophy. There is mild diffuse low-attenuation within the subcortical and periventricular white matter compatible with chronic microvascular disease. Vascular: No hyperdense vessel or unexpected calcification. Skull: Normal. Negative for fracture or focal lesion. Sinuses/Orbits: Paranasal sinuses and mastoid air cells are clear. Other: None. IMPRESSION: 1. No acute intracranial abnormalities. 2. Chronic microvascular disease and brain atrophy. Electronically Signed   By: Kerby Moors M.D.   On: 02/04/2023 09:51   DG Chest Portable 1 View  Result  Date: 02/04/2023 CLINICAL DATA:  Cough EXAM: PORTABLE CHEST 1 VIEW COMPARISON:  Chest radiograph 02/28/2019 FINDINGS: No pleural effusion. No pneumothorax. No focal airspace opacity. Normal cardiac and mediastinal contours. No radiographically apparent displaced rib fractures. Visualized upper abdomen is unremarkable. Degenerative changes at the bilateral AC joints. IMPRESSION: No focal airspace opacity. Electronically Signed   By: Marin Roberts M.D.   On: 02/04/2023 07:50    Procedures Procedures    Medications Ordered in ED Medications  lidocaine (LIDODERM) 5 % 1 patch (1 patch Transdermal Patch Applied 02/04/23 0944)  sodium chloride 0.9 % bolus 1,000 mL (1,000 mLs Intravenous New Bag/Given 02/04/23 0940)    ED Course/ Medical Decision Making/ A&P                             Medical Decision Making Amount and/or Complexity of Data Reviewed Labs: ordered. Radiology: ordered.  Risk Prescription drug management.   Kascha Lamkins is here with suspected pain.  Family concerned that she was maybe having a bad headache or abdominal pain.  History of dementia.  She appears very pleasant on exam.  Was able to get her up and ambulate under her own power in the hospital room without any issues.  She has normal vitals.  No fever.  After extensive conversation with family we will look for infectious process we will scan her head and her abdomen and pelvis to see if there is any process going on there that could lead to discomfort.  She has 24/7 support at home.  Sometimes they think that her knee might be bothering her.  But she does not appear to have any discomfort on exam and is very mobile in the room.  I have offered to have a walker may be sent to their house to help when needed.  Sounds like to doing a great job taking care of her and they have 24/7 support at home.  Will do broad workup including basic labs, CT head, chest x-[place], urinalysis, CT scan abdomen pelvis.  Per my review and  interpretation of labs and images as no acute findings.  No significant anemia or electrolyte abnormality or kidney injury.  CT scan of the head and abdomen pelvis unremarkable.  Chest x-[place] with no pneumonia.  No UTI.  TOC consulted to see if we can provide some more home support.  Patient discharged.  Have placed DME order for hospital bed for home to see if that can help with her chronic pain and mobility as well as bedside commode.  This chart was dictated using voice recognition software.  Despite best efforts to proofread,  errors  can occur which can change the documentation meaning.         Final Clinical Impression(s) / ED Diagnoses Final diagnoses:  Acute low back pain, unspecified back pain laterality, unspecified whether sciatica present    Rx / DC Orders ED Discharge Orders     None         Lennice Sites, DO 02/04/23 [place], Rishab Stoudt, DO 02/04/23 1018

## 2023-02-04 NOTE — ED Notes (Addendum)
Spoke with Monica Erickson regarding sending pt home. All questions answered and update given.

## 2023-02-04 NOTE — ED Notes (Signed)
[facility] called for pt 

## 2023-02-09 ENCOUNTER — Telehealth: Payer: Self-pay

## 2023-02-09 NOTE — Telephone Encounter (Signed)
        Patient  visited [place] on 3/27     Telephone encounter attempt :  1st  A HIPAA compliant voice message was left requesting a return call.  Instructed patient to call back    Monica Erickson [facility] Care Guide, [place] [phone] [address], [place], [place] [address] Phone: [phone] Email: [email or web address]       

## 2023-02-10 ENCOUNTER — Telehealth: Payer: Self-pay | Admitting: *Deleted

## 2023-02-10 ENCOUNTER — Telehealth: Payer: Self-pay

## 2023-02-10 NOTE — Telephone Encounter (Signed)
     Patient  visit on 3/37  at [place] you been able to follow up with your primary care physician? Yes   The patient was or was not able to obtain any needed medicine or equipment. No  Are there diet recommendations that you are having difficulty following? na  Patient expresses understanding of discharge instructions and education provided has no other needs at this time.  Yes      [place] [phone] [address], [place], [place] [address] Phone: [phone] Email: [email or web address]

## 2023-02-10 NOTE — Progress Notes (Signed)
  Care Coordination   Note   02/10/2023 Name: Agigail Nordine MRN: [id number] DOB: 1946-09-21  Sherrina Bulluck is a 77 y.o. year old female who sees [place], Jake Church I, NP (Inactive) for primary care. I reached out to Johna Roles by phone today to offer care coordination services.  Ms. Triola was given information about Care Coordination services today including:   The Care Coordination services include support from the care team which includes your Nurse Coordinator, Clinical Social Worker, or Pharmacist.  The Care Coordination team is here to help remove barriers to the health concerns and goals most important to you. Care Coordination services are voluntary, and the patient may decline or stop services at any time by request to their care team member.   Care Coordination Consent Status: Patient agreed to services and verbal consent obtained.   Follow up plan:  Telephone appointment with care coordination team member scheduled for:  02/11/23  Encounter Outcome:  Pt. Scheduled  Made a daughter aware [name] Steveson POA there aren't any medication assistance programs for lidocaine and usually Medicare does not cover it unless it is for cancer pain.    Occasionally Medicaid may cover some formulations.  Patient can buy this OTC at 4% (RX is 5%) and is ~$10-15.   Patient may also have an OTC card they can utilize.  [facility] has combo product with Lidocaine for < $10.  Daughter understood and thanked me for the call.   [place]  Direct Dial: [phone]

## 2023-02-11 ENCOUNTER — Ambulatory Visit: Payer: Self-pay

## 2023-02-11 NOTE — Patient Outreach (Signed)
  Care Coordination   Initial Visit Note   02/11/2023 Name: Monica Erickson MRN: [id number] DOB: 01/20/1946  Monica Erickson is a 77 y.o. year old female who sees Schmerge, Meredeth Ide, NP for primary care. I  spoke with patients daughter and primary caregiver [name] Steveson by phone.  What matters to the patients health and wellness today?  We need a hospital bed and home health to begin    Goals Addressed             This Visit's Progress    Care Coordination Activities       Care Coordination Interventions: Determined the patient has not yet received a hospital bed or bedside commode. The patient has also yet to begin with home health OT/PT. Both of which were ordered during 3/27 ED visit  Performed chart review to note the DME orders were sent to [facility] and spoke with Lorin [facility] advising it is documented [facility] accepted DME orders. Monesha advised a member of their team would outreach the patient. Provided [name]'s contact number to [facility] Performed chart review to note patients home health orders were sent to [facility] and spoke with Davita who confirms orders were received. Unfortunately, ordering diagnosis does not fall under CMS guidelines for PT/OT. Davita indicated Sharmon Revere is currently attempting to obtain a new face to face with updated qualifying diagnoses Provided Davita with patients updated primary care providers name and office phone number; Towanda Octave will provide contact information to Sharmon Revere Discussed above interventions with [name] advising she should receive a call from both agencies to arrange services Scheduled follow up call over the next two days to assess goal progression         SDOH assessments and interventions completed:  No     Care Coordination Interventions:  Yes, provided   Interventions Today    Flowsheet Row Most Recent Value  Chronic Disease   Chronic disease during today's visit Other  General  Interventions   General Interventions Discussed/Reviewed General Interventions Discussed, Communication with  Communication with --  [facility], DME]        Follow up plan: Follow up call scheduled for 4/5    Encounter Outcome:  Pt. Visit Completed   Daneen Schick, Arita Miss, CDP Social Worker, Certified Dementia Practitioner [facility]  Care Coordination [phone]

## 2023-02-11 NOTE — Patient Instructions (Signed)
Visit Information  Thank you for taking time to visit with me today. Please don't hesitate to contact me if I can be of assistance to you.   Following are the goals we discussed today:   Goals Addressed             This Visit's Progress    Care Coordination Activities       Care Coordination Interventions: Determined the patient has not yet received a hospital bed or bedside commode. The patient has also yet to begin with home health OT/PT. Both of which were ordered during 3/27 ED visit  Performed chart review to note the DME orders were sent to [facility] and spoke with Lorin Mercy advising it is documented [facility] accepted DME orders. Monesha advised a member of their team would outreach the patient. Provided [name]'s contact number to [facility] Performed chart review to note patients home health orders were sent to [facility] and spoke with Davita who confirms orders were received. Unfortunately, ordering diagnosis does not fall under CMS guidelines for PT/OT. Davita indicated Sharmon Revere is currently attempting to obtain a new face to face with updated qualifying diagnoses Provided Davita with patients updated primary care providers name and office phone number; Towanda Octave will provide contact information to Sharmon Revere Discussed above interventions with [name] advising she should receive a call from both agencies to arrange services Scheduled follow up call over the next two days to assess goal progression         Our next appointment is by telephone on 4/5 at 9:00 am  Please call the care guide team at [phone] if you need to cancel or reschedule your appointment.   If you are experiencing a Mental Health or [place] or need someone to talk to, please call 911  Patient verbalizes understanding of instructions and care plan provided today and agrees to view in [place]. Active MyChart status and patient understanding of how to  access instructions and care plan via MyChart confirmed with patient.     Daneen Schick, BSW, CDP Social Worker, Certified Dementia Practitioner [facility]  Care Coordination [phone]

## 2023-02-13 ENCOUNTER — Ambulatory Visit: Payer: Self-pay

## 2023-02-13 NOTE — Patient Outreach (Signed)
  Care Coordination   Follow Up Visit Note   02/13/2023 Name: Monica Erickson MRN: [id number] DOB: [birth year]  Monica Erickson is a 77 y.o. year old female who sees Schmerge, Belenda Cruise, NP for primary care. I  spoke with patients daughter and caregiver Monica Erickson by phone.  What matters to the patients health and wellness today?  Patient is still in need of home health services    Goals Addressed             This Visit's Progress    COMPLETED: Care Coordination Activities       Care Coordination Interventions: Determined the patient did receive DME on 02/12/23 which included a raised toilet seat and hospital bed from [facility] Discussed the patient has yet to begin home health but did receive a home visit from her primary care providers office on 02/12/23 Education provided on the referral process home health follows advising Monica Erickson SW will plan to collaborate with [facility] to ensure orders are updated to allow home health to begin [facility] to determine an updated Face to Face has yet to be receive Collaboration with Monica Erickson of [facility] who advises she has been in contact with Monica Erickson with [facility] and patients clinician is currently completing necessary documentation in order to move forward with home health orders Discussed plan with Monica Erickson for this SW to sign off at this time; requested patients primary care providers office to contact SW as needed for any new care coordination referral needs          SDOH assessments and interventions completed:  No     Care Coordination Interventions:  Yes, provided  Interventions Today    Flowsheet Row Most Recent Value  Chronic Disease   Chronic disease during today's visit Other  General Interventions   General Interventions Discussed/Reviewed General Interventions Reviewed, Communication with, Durable Medical Equipment (DME)  Durable Medical Equipment (DME) Other  [hospital bed, raised toilet seat]   Communication with PCP/Specialists        Follow up plan: No further intervention required. The patient will continue to engage with [facility]. [facility] is actively working on updating documentation in order to initiate home health services for the patient. [facility] will submit documentation directly to [facility] whom will contact Monica Erickson to schedule home health services.    Encounter Outcome:  Pt. Visit Completed   Monica Erickson, BSW, CDP Social Worker, Certified Dementia Practitioner [facility] Care Management  Care Coordination [phone]

## 2023-02-13 NOTE — Patient Instructions (Signed)
Visit Information  Thank you for taking time to visit with me today. Please don't hesitate to contact me if I can be of assistance to you.   Following are the goals we discussed today:   Goals Addressed             This Visit's Progress    COMPLETED: Care Coordination Activities       Care Coordination Interventions: Determined the patient did receive DME on 02/12/23 which included a raised toilet seat and hospital bed from [facility] Discussed the patient has yet to begin home health but did receive a home visit from her primary care providers office on 02/12/23 Education provided on the referral process home health follows advising [name] SW will plan to collaborate with [facility] to ensure orders are updated to allow home health to begin [facility] to determine an updated Face to Face has yet to be receive Collaboration with Cindra Eves of [facility] who advises she has been in contact with Becky Sax with [facility] and patients clinician is currently completing necessary documentation in order to move forward with home health orders Discussed plan with Cindra Eves for this SW to sign off at this time; requested patients primary care providers office to contact SW as needed for any new care coordination referral needs          If you are experiencing a Mental Health or Behavioral Health Crisis or need someone to talk to, please call 911  Patient verbalizes understanding of instructions and care plan provided today and agrees to view in [place]. Active MyChart status and patient understanding of how to access instructions and care plan via MyChart confirmed with patient.     No further follow up required: Please follow up with your primary care provider as needed. If you do not hear from Home Health by Monday afternoon to schedule an intake appointment please contact your provider.   Bevelyn Ngo, BSW, CDP Social Worker, Certified Dementia  Practitioner [facility] Care Management  Care Coordination [phone]

## 2023-03-16 ENCOUNTER — Emergency Department (HOSPITAL_COMMUNITY): Payer: Medicare Other

## 2023-03-16 ENCOUNTER — Inpatient Hospital Stay (HOSPITAL_COMMUNITY)
Admission: EM | Admit: 2023-03-16 | Discharge: 2023-03-27 | DRG: 486 | Disposition: A | Discharge status: Skilled Nursing Facility | Payer: Medicare Other | Attending: Internal Medicine | Admitting: Internal Medicine

## 2023-03-16 DIAGNOSIS — E871 Hypo-osmolality and hyponatremia: Secondary | ICD-10-CM | POA: Diagnosis present

## 2023-03-16 DIAGNOSIS — T8453XD Infection and inflammatory reaction due to internal right knee prosthesis, subsequent encounter: Secondary | ICD-10-CM

## 2023-03-16 DIAGNOSIS — Z833 Family history of diabetes mellitus: Secondary | ICD-10-CM

## 2023-03-16 DIAGNOSIS — F0390 Unspecified dementia without behavioral disturbance: Secondary | ICD-10-CM | POA: Diagnosis present

## 2023-03-16 DIAGNOSIS — G4733 Obstructive sleep apnea (adult) (pediatric): Secondary | ICD-10-CM | POA: Diagnosis present

## 2023-03-16 DIAGNOSIS — T8453XA Infection and inflammatory reaction due to internal right knee prosthesis, initial encounter: Principal | ICD-10-CM | POA: Diagnosis present

## 2023-03-16 DIAGNOSIS — Z96652 Presence of left artificial knee joint: Secondary | ICD-10-CM | POA: Diagnosis present

## 2023-03-16 DIAGNOSIS — Z751 Person awaiting admission to adequate facility elsewhere: Secondary | ICD-10-CM

## 2023-03-16 DIAGNOSIS — Z87891 Personal history of nicotine dependence: Secondary | ICD-10-CM

## 2023-03-16 DIAGNOSIS — Y831 Surgical operation with implant of artificial internal device as the cause of abnormal reaction of the patient, or of later complication, without mention of misadventure at the time of the procedure: Secondary | ICD-10-CM | POA: Diagnosis present

## 2023-03-16 DIAGNOSIS — M25461 Effusion, right knee: Secondary | ICD-10-CM | POA: Diagnosis not present

## 2023-03-16 DIAGNOSIS — Z8249 Family history of ischemic heart disease and other diseases of the circulatory system: Secondary | ICD-10-CM

## 2023-03-16 DIAGNOSIS — G309 Alzheimer's disease, unspecified: Secondary | ICD-10-CM | POA: Diagnosis present

## 2023-03-16 DIAGNOSIS — F02C0 Dementia in other diseases classified elsewhere, severe, without behavioral disturbance, psychotic disturbance, mood disturbance, and anxiety: Secondary | ICD-10-CM | POA: Diagnosis present

## 2023-03-16 LAB — COMPREHENSIVE METABOLIC PANEL
ALT: 11 U/L (ref 0–44)
AST: 22 U/L (ref 15–41)
Albumin: 3.3 g/dL — ABNORMAL LOW (ref 3.5–5.0)
Alkaline Phosphatase: 91 U/L (ref 38–126)
Anion gap: 15 (ref 5–15)
BUN: 16 mg/dL (ref 8–23)
CO2: 22 mmol/L (ref 22–32)
Calcium: 9.1 mg/dL (ref 8.9–10.3)
Chloride: 97 mmol/L — ABNORMAL LOW (ref 98–111)
Creatinine, Ser: 1.25 mg/dL — ABNORMAL HIGH (ref 0.44–1.00)
GFR, Estimated: 45 mL/min — ABNORMAL LOW (ref >60)
Glucose, Bld: 122 mg/dL — ABNORMAL HIGH (ref 70–99)
Potassium: 3.7 mmol/L (ref 3.5–5.1)
Sodium: 134 mmol/L — ABNORMAL LOW (ref 135–145)
Total Bilirubin: 1.5 mg/dL — ABNORMAL HIGH (ref 0.3–1.2)
Total Protein: 8.1 g/dL (ref 6.5–8.1)

## 2023-03-16 LAB — CBC
HCT: 37.8 % (ref 36.0–46.0)
Hemoglobin: 12.4 g/dL (ref 12.0–15.0)
MCH: 29.6 pg (ref 26.0–34.0)
MCHC: 32.8 g/dL (ref 30.0–36.0)
MCV: 90.2 fL (ref 80.0–100.0)
Platelets: 274 10*3/uL (ref 150–400)
RBC: 4.19 MIL/uL (ref 3.87–5.11)
RDW: 14.7 % (ref 11.5–15.5)
WBC: 9.8 10*3/uL (ref 4.0–10.5)
nRBC: 0 % (ref 0.0–0.2)

## 2023-03-16 LAB — URINALYSIS, ROUTINE W REFLEX MICROSCOPIC
Bilirubin Urine: NEGATIVE
Glucose, UA: NEGATIVE mg/dL
Ketones, ur: 5 mg/dL — AB
Nitrite: NEGATIVE
Protein, ur: 100 mg/dL — AB
Specific Gravity, Urine: 1.021 (ref 1.005–1.030)
pH: 5 (ref 5.0–8.0)

## 2023-03-16 LAB — TROPONIN I (HIGH SENSITIVITY): Troponin I (High Sensitivity): 14 ng/L (ref <18)

## 2023-03-16 MED ORDER — LIDOCAINE 5 % EX PTCH
1.0000 | MEDICATED_PATCH | CUTANEOUS | Status: DC
  Administered 2023-03-16 – 2023-03-26 (×6): 1 via TRANSDERMAL
  Filled 2023-03-16 (×6): qty 1

## 2023-03-16 MED ORDER — IBUPROFEN 800 MG PO TABS: 800.0000 mg | ORAL_TABLET | Freq: Once | ORAL | Status: DC

## 2023-03-16 MED ORDER — TRAMADOL HCL 50 MG PO TABS
100.0000 mg | ORAL_TABLET | Freq: Once | ORAL | Status: DC
  Filled 2023-03-16: qty 2

## 2023-03-16 NOTE — Progress Notes (Signed)
TOC CSW is currently following and awaiting PT recommendations.  Rai Sinagra Tarpley-Carter, MSW, LCSW-A Pronouns:  She/Her/Hers [facility] Clinical Social Worker Direct Number:  [phone] [email or web address]

## 2023-03-16 NOTE — [place] FL2 (Signed)
[place] MEDICAID FL2 LEVEL OF CARE FORM     IDENTIFICATION  Patient Name: Monica Erickson Birthdate: Nov 08, 1946 Sex: female Admission Date (Current Location): 03/16/2023  [facility] and [place] Number:  Producer, television/film/video and Address:  [facility], [address] [place], [place] [address]      Provider Number: [id number]  Attending Physician Name and Address:  Alvira Monday, MD  Relative Name and Phone Number:  [name] [phone]    Current Level of Care: Hospital Recommended Level of Care: Skilled Nursing Facility Prior Approval Number:    Date Approved/Denied:   PASRR Number: [id number]  Discharge Plan: SNF    Current Diagnoses: Patient Active Problem List   Diagnosis Date Noted   Pneumonia of right lung due to infectious organism    Normocytic anemia    H. pylori infection    Pulmonary embolism (HCC) 02/25/2022   Failure to thrive in adult 02/25/2022   Sepsis due to pneumonia (HCC) 02/25/2022   Hypokalemia 02/25/2022   AMS (altered mental status) 02/23/2022   Pseudophakia of both eyes 02/26/2021   Alzheimer's dementia (HCC) 09/24/2020   Generalized abdominal pain 02/17/2020   Poor appetite 02/17/2020   Weight loss 02/17/2020   Constipation 02/17/2020   Other hyperlipidemia 02/17/2020   Belching 02/17/2020   Stone, kidney 12/07/2018   Dementia without behavioral disturbance (HCC) 12/07/2018    Orientation RESPIRATION BLADDER Height & Weight     Self  Normal Continent Weight:   Height:     BEHAVIORAL SYMPTOMS/MOOD NEUROLOGICAL BOWEL NUTRITION STATUS      Continent Diet (Regular)  AMBULATORY STATUS COMMUNICATION OF NEEDS Skin   Limited Assist Verbally Normal                       Personal Care Assistance Level of Assistance  Bathing, Feeding, Dressing Bathing Assistance: Limited assistance Feeding assistance: Independent Dressing Assistance: Limited assistance     Functional Limitations Info  Sight,  Hearing, Speech Sight Info: Adequate Hearing Info: Adequate Speech Info: Adequate    SPECIAL CARE FACTORS FREQUENCY  PT (By licensed PT), OT (By licensed OT)     PT Frequency: 5 x a week OT Frequency: 5 x a week            Contractures Contractures Info: Not present    Additional Factors Info  Code Status, Allergies Code Status Info: Full Code Allergies Info: NKA           Current Medications (03/16/2023):  This is the current hospital active medication list Current Facility-Administered Medications  Medication Dose Route Frequency Provider Last Rate Last Admin   ibuprofen (ADVIL) tablet 800 mg  800 mg Oral Once Margarita Grizzle, MD       lidocaine (LIDODERM) 5 % 1 patch  1 patch Transdermal Q24H Alvira Monday, MD   1 patch at 03/16/23 1759   traMADol (ULTRAM) tablet 100 mg  100 mg Oral Once Margarita Grizzle, MD       Current Outpatient Medications  Medication Sig Dispense Refill   enoxaparin (LOVENOX) 100 MG/ML injection Inject 1 mL (100 mg total) into the skin daily. 30 mL 2   fluticasone (FLONASE) 50 MCG/ACT nasal spray Place 2 sprays into both nostrils daily. (Patient not taking: Reported on 02/13/2022) 16 g 6   folic acid (FOLVITE) 1 MG tablet Take 1 tablet (1 mg total) by mouth daily. 30 tablet 1   megestrol (MEGACE) 40 MG/ML suspension Take 5 mLs (  200 mg total) by mouth daily. 240 mL 0   mirtazapine (REMERON SOL-TAB) 15 MG disintegrating tablet Take 1 tablet (15 mg total) by mouth at bedtime. (Patient not taking: Reported on 02/23/2022) 30 tablet 2   omeprazole (PRILOSEC) 40 MG capsule Take 1 capsule (40 mg total) by mouth 2 (two) times daily before a meal for 14 days. May open capsule and put on soft food. 28 capsule 0   simethicone (MYLICON) 125 MG chewable tablet Chew 125 mg by mouth every 6 (six) hours as needed for flatulence.       Discharge Medications: Please see discharge summary for a list of discharge medications.  Relevant Imaging Results:  Relevant Lab  Results:   Additional Information SSN#:  [id number]     Ht:  5\' 8"     Wt:  143 lbs 8.3 oz  Jylian Pappalardo C Tarpley-Carter, LCSWA

## 2023-03-16 NOTE — ED Triage Notes (Signed)
Pt arrived to ED via [facility] from home w/ multiple complaints including CP. Pt dementia baseline A&Ox1 (self). Family reported increased agitation, decreased mobility (ambulatory at baseline), bilateral swelling to knees and "at some point pt pointed to her chest and said tight tight". EMS reports family was agitated on scene. VSS w/ EMS. Ems reports pt gets agitated when messed w/.

## 2023-03-16 NOTE — ED Provider Notes (Signed)
[place] EMERGENCY DEPARTMENT AT [facility] Provider Note   CSN: [id number] Arrival date & time: 03/16/23  4098     History  Chief Complaint  Patient presents with   Chest Pain   Multiple Complaints    Monica Erickson is a 77 y.o. female.  HPI 77 year old female history of dementia, PE, anemia, presents today from home with family.  Daughters at the bedside gives the history.  They state that the patient normally walks and goes up multiple stairs per day.  However, over the past couple days she has not wanted to get up and seems to be in severe pain with any movement.  They have given her tramadol in which she was able to sleep.  However they noticed some right knee swelling.  There has been no trauma that they know of.  She has had some increased agitation thought to be due to the pain.  She did seem to verbalize this morning that her chest was tight.  They report no home medications.  She has had "strong urine".  She has been eating and drinking without difficulty.  She does have a history of UTI.     Home Medications Prior to Admission medications   Medication Sig Start Date End Date Taking? Authorizing Provider  cyanocobalamin (VITAMIN B12) 1000 MCG/ML injection Inject 1,000 mcg into the muscle once a week.  08/05/24 Yes [provider]  diazepam (VALIUM) 2 MG tablet Take 2 mg by mouth 2 (two) times daily as needed for anxiety or sedation. 10/14/22  Yes [provider]  Ergocalciferol 50 MCG (2000 UT) TABS Take 2,000 Units by mouth daily at 12 noon. 02/12/23 03/12/24 Yes [provider]      Allergies    Patient has no known allergies.    Review of Systems   Review of Systems  Physical Exam Updated Vital Signs BP 139/88 (BP Location: Right Arm)   Pulse 84   Temp 97.8 F (36.6 C) (Oral)   Resp 19   Wt 73 kg   SpO2 100%   BMI 24.47 kg/m  Physical Exam Vitals and nursing note reviewed.  Constitutional:      Appearance: She is  well-developed.  Cardiovascular:     Rate and Rhythm: Normal rate and regular rhythm.     Heart sounds: Normal heart sounds.  Pulmonary:     Effort: Pulmonary effort is normal.     Breath sounds: Normal breath sounds.  Chest:     Chest wall: No mass, deformity or tenderness.  Abdominal:     General: Bowel sounds are normal.     Palpations: Abdomen is soft.  Musculoskeletal:     Cervical back: Normal range of motion.     Comments: Right knee appears to have some diffuse swelling with no erythema probable effusion No deformity noted there is tenderness to palpation diffusely over right knee, bilateral hips, and left foot No obvious external signs of trauma Back was visualized and no obvious signs of trauma and no point tenderness appreciated on palpation  Skin:    General: Skin is warm and dry.     Capillary Refill: Capillary refill takes less than 2 seconds.  Neurological:     Mental Status: She is alert.     ED Results / Procedures / Treatments   Labs (all labs ordered are listed, but only abnormal results are displayed) Labs Reviewed  COMPREHENSIVE METABOLIC PANEL - Abnormal; Notable for the following components:  Result Value   Sodium 134 (*)    Chloride 97 (*)    Glucose, Bld 122 (*)    Creatinine, Ser 1.25 (*)    Albumin 3.3 (*)    Total Bilirubin 1.5 (*)    GFR, Estimated 45 (*)    All other components within normal limits  URINALYSIS, ROUTINE W REFLEX MICROSCOPIC - Abnormal; Notable for the following components:   Color, Urine AMBER (*)    APPearance HAZY (*)    Hgb urine dipstick SMALL (*)    Ketones, ur 5 (*)    Protein, ur 100 (*)    Leukocytes,Ua TRACE (*)    Bacteria, UA FEW (*)    All other components within normal limits  CBC WITH DIFFERENTIAL/PLATELET - Abnormal; Notable for the following components:   Hemoglobin 11.8 (*)    HCT 35.6 (*)    All other components within normal limits  SEDIMENTATION RATE - Abnormal; Notable for the following  components:   Sed Rate 94 (*)    All other components within normal limits  C-REACTIVE PROTEIN - Abnormal; Notable for the following components:   CRP 14.0 (*)    All other components within normal limits  SYNOVIAL CELL COUNT + DIFF, W/ CRYSTALS - Abnormal; Notable for the following components:   Color, Synovial STRAW (*)    Appearance-Synovial HAZY (*)    WBC, Synovial 11,150 (*)    Neutrophil, Synovial 92 (*)    Monocyte-Macrophage-Synovial Fluid 2 (*)    All other components within normal limits  BASIC METABOLIC PANEL - Abnormal; Notable for the following components:   CO2 20 (*)    BUN 25 (*)    Anion gap 16 (*)    All other components within normal limits  BASIC METABOLIC PANEL - Abnormal; Notable for the following components:   CO2 19 (*)    Glucose, Bld 155 (*)    BUN 27 (*)    All other components within normal limits  BASIC METABOLIC PANEL - Abnormal; Notable for the following components:   Glucose, Bld 114 (*)    BUN 25 (*)    All other components within normal limits  CBC - Abnormal; Notable for the following components:   Hemoglobin 11.7 (*)    All other components within normal limits  AEROBIC/ANAEROBIC CULTURE W GRAM STAIN (SURGICAL/DEEP WOUND)  SURGICAL PCR SCREEN  CBC  CK  LACTIC ACID, PLASMA  T4, FREE  TSH  CBC  CBC WITH DIFFERENTIAL/PLATELET  TROPONIN I (HIGH SENSITIVITY)    EKG EKG Interpretation  Date/Time:  Monday Mar 16 2023 08:58:09 EDT Ventricular Rate:  102 PR Interval:  175 QRS Duration: 89 QT Interval:  323 QTC Calculation: 421 R Axis:   72 Text Interpretation: Sinus tachycardia Probable left atrial enlargement Anterior infarct, old heart rate has increased since first prior tracing of February 04, 2023 Confirmed by Margarita Grizzle [phone]) on 03/16/2023 9:28:57 AM  Radiology No results found.  Procedures Procedures    Medications Ordered in ED Medications  lidocaine (LIDODERM) 5 % 1 patch (1 patch Transdermal Patch Removed 03/23/23  0548)  acetaminophen (TYLENOL) tablet 650 mg (650 mg Oral Given 03/22/23 2143)    Or  acetaminophen (TYLENOL) suppository 650 mg ( Rectal See Alternative 03/22/23 2143)  cefTRIAXone (ROCEPHIN) 2 g in sodium chloride 0.9 % 100 mL IVPB (2 g Intravenous New Bag/Given 03/23/23 0046)  vancomycin (VANCOREADY) IVPB 1250 mg/250 mL (1,250 mg Intravenous New Bag/Given 03/22/23 2140)  methylPREDNISolone sodium succinate (SOLU-MEDROL) 40 mg/mL  injection 40 mg (40 mg Intravenous Given 03/23/23 0927)  hydrALAZINE (APRESOLINE) injection 5 mg (5 mg Intravenous Given 03/22/23 0933)  senna-docusate (Senokot-S) tablet 1 tablet (1 tablet Oral Not Given 03/23/23 0923)  enoxaparin (LOVENOX) injection 30 mg (30 mg Subcutaneous Given 03/22/23 1543)  HYDROcodone-acetaminophen (NORCO/VICODIN) 5-325 MG per tablet 1 tablet (1 tablet Oral Given 03/17/23 0946)  diazepam (VALIUM) tablet 5 mg (5 mg Oral Given 03/20/23 0923)  vancomycin (VANCOREADY) IVPB 1250 mg/250 mL (0 mg Intravenous Stopped 03/21/23 0123)  bisacodyl (DULCOLAX) suppository 10 mg (10 mg Rectal Given 03/22/23 1543)    ED Course/ Medical Decision Making/ A&P Clinical Course as of 03/23/23 0953  Mon Mar 16, 2023  1324 Right knee x-Murdoch reviewed and interpreted and moderate effusion noted per radiologist interpretation on my interpretation there is no acute osseous abnormality [DR]  1325 Hip x-rays and pelvis reviewed interpreted and no evidence of acute abnormality and radiologist interpretation concurs [DR]  1326 Left foot x-Marandola reviewed interpreted no evidence of acute fracture is noted [DR]  1326 Complete metabolic panel is reviewed and interpreted and is significant for mildly elevated creatinine, mild hyponatremia and bilirubin at 1.5 [DR]  1326 CBC reviewed interpreted within normal limits [DR]  1326 First troponin reviewed interpreted and normal [DR]  1439 Urinalysis reviewed and interpreted with red blood cells of 6-10 and white blood cells of 0-5 doubt acute  urinary tract infection [DR]    Clinical Course User Index [DR] Margarita Grizzle, MD                             Medical Decision Making Amount and/or Complexity of Data Reviewed Labs: ordered. Radiology: ordered.  Risk Prescription drug management. Decision regarding hospitalization.  Keelah Goldfield is a 77 year old female with a history of dementia.  She is cared for by her daughter [name] who is at the bedside.  Daughter reports that the patient has been less active than usual and appears to be in pain.  She has had no direct trauma, falls, redness, or abnormalities noted.  Patient complains of diffuse pain.  On my exam she appears to have some swelling of the right knee and has some tenderness of the foot than the and hips.  Is very difficult to evaluate her based on physical exam. She was evaluated here with imaging of abdomen, pelvis, right knee, bilateral hips, and left foot.  There is an effusion on the right knee.  However there is no evidence of acute fracture or other acute abnormalities noted.  Patient had labs performed that showed some mild hyponatremia.  There was some question of concentrated urine urinalysis is obtained without any evidence of acute infection. Daughter are currently not at bedside. Patient appears stable for discharge.  I have attempted to call patient's daughter [name] at [phone] without an answer. All numbers on patient's contact attempted to be called.  1 was disconnected and the other 2 did not answer. Patient appears stable for discharge. Discussed with daughter, plan PT OT and referral for home health referral for home health       Final Clinical Impression(s) / ED Diagnoses Final diagnoses:  Knee effusion, right    Rx / DC Orders ED Discharge Orders          Ordered    Wheelchair        03/16/23 1523              Margarita Grizzle, MD 03/23/23  0953  

## 2023-03-16 NOTE — ED Notes (Signed)
Patient refused to let us take her clothes off. Made patient more aggravated

## 2023-03-16 NOTE — ED Notes (Signed)
Unable to obtain labs at this time, patient refusing and fighting

## 2023-03-16 NOTE — ED Notes (Signed)
Patient transported to MRI 

## 2023-03-16 NOTE — ED Notes (Signed)
Pt received for care at 1905.  Pt off unit at this time.  This RN will introduce self to pt upon return.

## 2023-03-17 ENCOUNTER — Emergency Department (HOSPITAL_COMMUNITY): Payer: Medicare Other

## 2023-03-17 ENCOUNTER — Emergency Department (HOSPITAL_BASED_OUTPATIENT_CLINIC_OR_DEPARTMENT_OTHER): Payer: Medicare Other

## 2023-03-17 DIAGNOSIS — M79661 Pain in right lower leg: Secondary | ICD-10-CM

## 2023-03-17 LAB — LACTIC ACID, PLASMA: Lactic Acid, Venous: 1.3 mmol/L (ref 0.5–1.9)

## 2023-03-17 LAB — CK: Total CK: 184 U/L (ref 38–234)

## 2023-03-17 LAB — T4, FREE: Free T4: 1.07 ng/dL (ref 0.61–1.12)

## 2023-03-17 LAB — TSH: TSH: 3.206 u[IU]/mL (ref 0.350–4.500)

## 2023-03-17 MED ORDER — HYDROCODONE-ACETAMINOPHEN 5-325 MG PO TABS
1.0000 | ORAL_TABLET | Freq: Once | ORAL | Status: AC
  Administered 2023-03-17: 1 via ORAL
  Filled 2023-03-17: qty 1

## 2023-03-17 NOTE — ED Notes (Signed)
Md made aware that pt no longer has an iv, states that pt doesn't need an iv at this time.

## 2023-03-17 NOTE — TOC Initial Note (Signed)
Transition of Care [facility]) - Initial/Assessment Note    Patient Details  Name: Monica Erickson MRN: [id number] Date of Birth: December 17, 1945  [facility] of [facility]) CM/SW Contact:    Susa Simmonds, LCSWA Phone Number: 03/17/2023, 10:17 AM  Clinical Narrative: CSW spoke with patients daughter Monica Erickson [phone]. [name] stated their plan after SNF is for patient to return home. Patient has a home aide through [employer] and has home health. CSW gave [name] the current bed offers ([facility], [facility], [facility], [facility], and [facility]). Patients daughter will call CSW back with a decision.                Expected Discharge Plan: Skilled Nursing Facility Barriers to Discharge: Continued Medical Work up   Patient Goals and CMS Choice Patient states their goals for this hospitalization and ongoing recovery are:: Return home after rehab with home aide and home health          Expected Discharge Plan and Services                                              Prior Living Arrangements/Services   Lives with:: Adult Children Patient language and need for interpreter reviewed:: Yes Do you feel safe going back to the place where you live?: Yes      Need for Family Participation in Patient Care: Yes (Comment) Care giver support system in place?: Yes (comment) Current home services: Other (comment) (Patient has a Copy) Criminal Activity/Legal Involvement Pertinent to Current Situation/Hospitalization: No - Comment as needed  Activities of Daily Living      Permission Sought/Granted                  Emotional Assessment Appearance:: Appears stated age     Orientation: : Oriented to Self Alcohol / Substance Use: Not Applicable    Admission diagnosis:  CP Patient Active Problem List   Diagnosis Date Noted   Pneumonia of right lung due to infectious organism    Normocytic anemia    H. pylori infection    Pulmonary embolism (HCC) 02/25/2022   Failure to thrive in  adult 02/25/2022   Sepsis due to pneumonia (HCC) 02/25/2022   Hypokalemia 02/25/2022   AMS (altered mental status) 02/23/2022   Pseudophakia of both eyes 02/26/2021   Alzheimer's dementia (HCC) 09/24/2020   Generalized abdominal pain 02/17/2020   Poor appetite 02/17/2020   Weight loss 02/17/2020   Constipation 02/17/2020   Other hyperlipidemia 02/17/2020   Belching 02/17/2020   Stone, kidney 12/07/2018   Dementia without behavioral disturbance (HCC) 12/07/2018   PCP:  Nona Dell, NP Pharmacy:   [id number] - [place] - [address] [place] [address] Phone: [phone] Fax: [phone]  [facility] [address] [place] [address] Phone: [phone] Fax: [phone]     Social Determinants of Health (SDOH) Social History: SDOH Screenings   Depression (PHQ2-9): Low Risk  (12/23/2021)  Tobacco Use: Medium Risk (02/04/2023)   SDOH Interventions:     Readmission Risk Interventions     No data to display

## 2023-03-17 NOTE — Progress Notes (Signed)
TOC CSW spoke with pts daughter, [name] [phone].  [name] states they will act bed offer from [facility].  CSW will begin [employer].  Dayna Geurts Tarpley-Carter, MSW, LCSW-A Pronouns:  She/Her/Hers [facility] Clinical Social Worker Direct Number:  [phone] [email or web address]

## 2023-03-17 NOTE — Evaluation (Signed)
Physical Therapy Evaluation Patient Details Name: Monica Erickson MRN: [id number] DOB: [birth year] Today's Date: 03/17/2023  History of Present Illness  77 y.o. female with a history of dementia.  She is cared for by her daughter [name] who is at the bedside.  Per daughter patient has been less active than usual and appears to be in pain.  She has had no direct trauma, falls, redness, or abnormalities noted.  Patient complains of diffuse pain. PMH significant of dementia, chronic headaches, depression, gastric ulcer, H. pylori infection, nephrolithiasis, sleep apnea not on CPAP.  Recently seen in the ED on 4/5 for dehydration, UTI, and possible H. pylori infection.   Clinical Impression  Monica Erickson is 77 y.o. female admitted with above HPI and diagnosis. Patient is currently limited by functional impairments below (see PT problem list). Patient unable to provide any home or PLOF, per chart review pt was living with children and is mod independent with RW at baseline. Evaluation greatly limited by pt's cognition as she was unable to follow commands/cues. Pt appearing to become more anxious/fearful about extremity assessment but calmed by calm voice and emotional support. Pt would require Total Assist at this time to complete bed mobility and beyond. Patient will benefit from continued skilled PT interventions to address impairments and progress independence with mobility. Acute PT will follow and progress as able.        Recommendations for follow up therapy are one component of a multi-disciplinary discharge planning process, led by the attending physician.  Recommendations may be updated based on patient status, additional functional criteria and insurance authorization.     Assistance Recommended at Discharge Frequent or constant Supervision/Assistance  Patient can return home with the following  Two people to help with walking and/or transfers;Two people to help with bathing/dressing/bathroom;Assistance  with cooking/housework;Assistance with feeding;Direct supervision/assist for medications management;Direct supervision/assist for financial management;Assist for transportation;Help with stairs or ramp for entrance    Equipment Recommendations  (TBD)  Recommendations for Other Services       Functional Status Assessment Patient has had a recent decline in their functional status and/or demonstrates limited ability to make significant improvements in function in a reasonable and predictable amount of time     Precautions / Restrictions Precautions Precautions: Fall Restrictions Weight Bearing Restrictions: No      Mobility  Bed Mobility    General bed mobility comments: pt would require Total Assist for bed mobility due to cognitive impairements    Transfers         Ambulation/Gait         Stairs            Wheelchair Mobility    Modified Rankin (Stroke Patients Only)          Pertinent Vitals/Pain Pain Assessment Pain Assessment: PAINAD Breathing: normal Negative Vocalization: occasional moan/groan, low speech, negative/disapproving quality Facial Expression: sad, frightened, frown Body Language: tense, distressed pacing, fidgeting Consolability: distracted or reassured by voice/touch PAINAD Score: 4 Pain Intervention(s): Limited activity within patient's tolerance, Monitored during session    Home Living Family/patient expects to be discharged to:: Skilled nursing facility Living Arrangements: Children;Other relatives   Type of Home: House Home Access: Stairs to enter   [employer] of Steps: flight   Home Layout: Two level   Additional Comments: per chart review pt typically ambulates with RW and no difficulty. pt unable to provide home or PLOF, obtained from  admission in 02/2022.    Prior Function Prior Level of Function : Patient  poor historian/Family not available             Mobility Comments: uses RW for household  mobility with supervision only ADLs Comments: toilets herself, likes to be clean.     Hand Dominance        Extremity/Trunk Assessment   Upper Extremity Assessment Upper Extremity Assessment: RUE deficits/detail;LUE deficits/detail;Difficult to assess due to impaired cognition RUE Deficits/Details: PROM/AAROM completed for shoulder flex/abd and elbow flex/ext, appear WFL's.  LUE Deficits/Details: PROM/AAROM completed for shoulder flex/abd and elbow flex/ext, appear WFL's.   Lower Extremity Assessment Lower Extremity Assessment: RLE deficits/detail;LLE deficits/detail;Difficult to assess due to impaired cognition RLE Deficits/Details: pt guarding bil LE's with AAROM/PROM, very rigid with resisting knee flexion/hip abduction  LLE Deficits/Details: pt guarding bil LE's with AAROM/PROM, very rigid with resisting knee flexion/hip abduction      Communication      Cognition Arousal/Alertness: Awake/alert Behavior During Therapy: Restless Overall Cognitive Status: History of cognitive impairments - at baseline Area of Impairment: Orientation, Attention, Memory, Following commands, Safety/judgement, Problem solving, Awareness                 Orientation Level: Disoriented to, Person, Place, Time, Situation Current Attention Level: Focused Memory: Decreased short-term memory, Decreased recall of precautions Following Commands:  (unable to follow commands)     Problem Solving: Requires tactile cues, Requires verbal cues, Difficulty sequencing, Decreased initiation, Slow processing General Comments: pt with significant confusion and unable to follow cues/commands.        General Comments      Exercises     Assessment/Plan    PT Assessment Patient needs continued PT services  PT Problem List Decreased strength;Decreased activity tolerance;Decreased balance;Decreased mobility;Decreased coordination;Decreased cognition;Decreased safety awareness;Decreased knowledge of use of  DME;Decreased knowledge of precautions;Decreased range of motion       PT Treatment Interventions DME instruction;Gait training;Stair training;Functional mobility training;Therapeutic activities;Therapeutic exercise;Balance training;Neuromuscular re-education;Cognitive remediation;Patient/family education;Wheelchair mobility training    PT Goals (Current goals can be found in the Care Plan section)  Acute Rehab PT Goals Patient Stated Goal: none stated PT Goal Formulation: Patient unable to participate in goal setting Time For Goal Achievement: 03/31/23 Potential to Achieve Goals: Fair    Frequency Min 2X/week     Co-evaluation               AM-PAC PT "6 Clicks" Mobility  Outcome Measure Help needed turning from your back to your side while in a flat bed without using bedrails?: Total Help needed moving from lying on your back to sitting on the side of a flat bed without using bedrails?: Total Help needed moving to and from a bed to a chair (including a wheelchair)?: Total Help needed standing up from a chair using your arms (e.g., wheelchair or bedside chair)?: Total Help needed to walk in hospital room?: Total Help needed climbing 3-5 steps with a railing? : Total 6 Click Score: 6    End of Session   Activity Tolerance:  (limitd by cognition) Patient left: in bed;with call bell/phone within reach Nurse Communication: Mobility status PT Visit Diagnosis: Other abnormalities of gait and mobility (R26.89);Difficulty in walking, not elsewhere classified (R26.2);Other symptoms and signs involving the nervous system (R29.898)    Time: 9811-9147 PT Time Calculation (min) (ACUTE ONLY): 14 min   Charges:   PT Evaluation $PT Eval Moderate Complexity: 1 Mod          Wynn Maudlin, DPT Acute Rehabilitation Services Office [phone]  03/17/23 10:21 AM

## 2023-03-17 NOTE — Progress Notes (Signed)
TOC CSW began [place].  Merilyn Pagan Tarpley-Carter, MSW, LCSW-A Pronouns:  She/Her/Hers [facility] HealthTransitions of Care Clinical Social Worker Direct Number:  [phone] [email or web address]

## 2023-03-17 NOTE — ED Notes (Signed)
Pt moved onto hospital bed for comfort. Brief changed. Bed in lowest position and locked with bed rails up X 3. Bed alarm activated. Call light within reach. No S/S of distress noted.

## 2023-03-17 NOTE — ED Provider Notes (Signed)
I assumed care in signout.  Previous provider felt patient required CT head and CT PE study.  CT head is negative.  Unfortunately her IV infiltrated and she is not willing to have further IVs. At this time she is not tachycardic or hypoxic. On arrival to the room she appears comfortable, but with any movement or palpation of her legs she starts crying.  She makes no mention of chest pain.  At this time will defer CT chest as I feel that PE is less likely.  She is also had extensive imaging of her legs without any obvious findings except for a right knee effusion. DVT studies have already been ordered.     Zadie Rhine, MD 03/17/23 [phone]

## 2023-03-17 NOTE — TOC CM/SW Note (Signed)
Late entry: RNCM consulted regarding safe discharge planning (Home with Home Health vs Skilled Nursing Facility Placement).  Physical Therapy evaluation placed; will follow up after recommendations from PT.    RNCM reviewed chart to find that pt home health was referred to [facility] and that pt has 8 hour CNA in the home ([employer]).  RNCM reached out to [facility] Rep to find that pt refused services resulting in cancellation of services.

## 2023-03-17 NOTE — ED Notes (Addendum)
Pt in bed, pt opens eyes to voice, pt easily arouses, pt is confused, pt talks, but doesn't answer questions, pt mildly compliant with directions.  pt has IV in R upper arm, pt night shift ct reported that iv had infiltrated, iv will not draw blood, flush with sig resistance, d/c iv due to presumed infiltration, dressing placed.

## 2023-03-17 NOTE — ED Provider Notes (Signed)
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 pylori infection 03/2020   Kidney stones    Night terrors    Sleep apnea    no cpap    Physical Exam  BP (!) 172/77   Pulse 90   Temp 98 F (36.7 C) (Oral)   Resp 14   SpO2 100%   Physical Exam  Procedures  Procedures  ED Course / MDM   Clinical Course as of 03/17/23 0044  Mon Mar 16, 2023  1324 Right knee x-Jeffreys reviewed and interpreted and moderate effusion noted per radiologist interpretation on my interpretation there is no acute osseous abnormality [DR]  1325 Hip x-rays and pelvis reviewed interpreted and no evidence of acute abnormality and radiologist interpretation concurs [DR]  1326 Left foot x-Treese reviewed interpreted no evidence of acute fracture is noted [DR]  1326 Complete metabolic panel is reviewed and interpreted and is significant for mildly elevated creatinine, mild hyponatremia and bilirubin at 1.5 [DR]  1326 CBC reviewed interpreted within normal limits [DR]  1326 First troponin reviewed interpreted and normal [DR]  1439 Urinalysis reviewed and interpreted with red blood cells of 6-10 and white blood cells of 0-5 doubt acute urinary tract infection [DR]    Clinical Course User Index [DR] Margarita Grizzle, MD   Received care of patient from Dr. Rosalia Hammers.  Please see her note for prior history, physical and care.  Briefly this is a 77 year old female with a history of dementia, PE who presents with increased agitation and pain.  Pain suspected to be secondary to right knee pain and effusion. No sign of septic arthritis.  Initial plan for home health.    Discussed with daughter [name], who is her POA.  She describes her having increased agitation from her baseline.  Reports that she normally is able to ambulate without problems, but now has pain  even when they tried to bring her from sitting to standing.  The report that they are concerned that if she attempts to stand she will have a fall due to her pain and agitation.  She is requesting skilled nursing facility placement due to concerns of having difficulty caring for her and her being safe at home.  Given she reports she might of had a fall and they do not know, ordered a CT of her knee for further evaluation for occult fracture.  Personally evaluated the CT of the knee which showed no evidence of fracture.  On my exam, she does have significant pain on movement of the bilateral lower extremities.  She has normal pulses bilaterally and have low suspicion for aortic dissection or acute arterial thrombus.  She has had workup including EKG, troponins, CT abdomen pelvis without contrast and CT L-spine which showed no acute findings.  Lower suspicion for transverse myelitis, cauda equina. It does appear that pain is limiting factor.  Given leg pain, CK, TSH, lactic acid ordered and WNL.  Given her history of PE with report of chest pain earlier, increased agitation from baseline with possible unwitnessed fall per daughter.  Ordered CT head and CT PE Study to evaluate for signs of ICH from trauma or PE as etiology of her chest pain.  She does not have neck pain nor arm symptoms to  suggest cervical spine fx.  Symptoms may also be secondary to vitamin b12 deficiency noted in the past. Ordered DVT studies of bilateral LE for LE pain.  If we do not see indication for admission agree with plan for transfer to rehab or SNF in setting of leg pain, dementia, difficulty ambulating.                         Alvira Monday, MD 03/17/23 0127

## 2023-03-17 NOTE — Progress Notes (Signed)
Lower extremity venous bilateral study completed.   Please see CV Proc for preliminary results.   Jshaun Abernathy, RDMS, RVT  

## 2023-03-18 NOTE — Discharge Planning (Signed)
RNCM consulted regarding DME chair lift for home use.  RNCM reached out to DME agency to find that obtaining a chair lift for home use is a process that can not be completed from the hospital.

## 2023-03-18 NOTE — Progress Notes (Signed)
CSW received a call from [facility] stating that they will be sending patients SNF request to the medical director for review. CSW was told they feel patient is total care. CSW contacted patients daughter Monica Erickson to make her aware that patient might be denied by [facility] for SNF placement. Patients daughter is prepared to take her home today if denied. Monica Erickson stated her mother will need a wheelchair at discharge and home health.

## 2023-03-18 NOTE — Discharge Planning (Signed)
Pt previously active with [facility] for Home Health services PT as confirmed by [facility] with Elnita Maxwell of [facility].  Pt will resume HH services should that be the disposition.

## 2023-03-19 ENCOUNTER — Encounter (HOSPITAL_COMMUNITY): Payer: Self-pay | Admitting: Internal Medicine

## 2023-03-19 ENCOUNTER — Other Ambulatory Visit: Payer: Self-pay

## 2023-03-19 DIAGNOSIS — M25461 Effusion, right knee: Secondary | ICD-10-CM | POA: Diagnosis not present

## 2023-03-19 DIAGNOSIS — G4733 Obstructive sleep apnea (adult) (pediatric): Secondary | ICD-10-CM | POA: Diagnosis present

## 2023-03-19 LAB — CBC WITH DIFFERENTIAL/PLATELET
Abs Immature Granulocytes: 0.04 10*3/uL (ref 0.00–0.07)
Basophils Absolute: 0.1 10*3/uL (ref 0.0–0.1)
Basophils Relative: 1 %
Eosinophils Absolute: 0.1 10*3/uL (ref 0.0–0.5)
Eosinophils Relative: 1 %
HCT: 35.6 % — ABNORMAL LOW (ref 36.0–46.0)
Hemoglobin: 11.8 g/dL — ABNORMAL LOW (ref 12.0–15.0)
Immature Granulocytes: 1 %
Lymphocytes Relative: 24 %
Lymphs Abs: 1.6 10*3/uL (ref 0.7–4.0)
MCH: 29.8 pg (ref 26.0–34.0)
MCHC: 33.1 g/dL (ref 30.0–36.0)
MCV: 89.9 fL (ref 80.0–100.0)
Monocytes Absolute: 0.7 10*3/uL (ref 0.1–1.0)
Monocytes Relative: 10 %
Neutro Abs: 4.4 10*3/uL (ref 1.7–7.7)
Neutrophils Relative %: 63 %
Platelets: 313 10*3/uL (ref 150–400)
RBC: 3.96 MIL/uL (ref 3.87–5.11)
RDW: 14.7 % (ref 11.5–15.5)
WBC: 6.8 10*3/uL (ref 4.0–10.5)
nRBC: 0 % (ref 0.0–0.2)

## 2023-03-19 LAB — SEDIMENTATION RATE: Sed Rate: 94 mm/hr — ABNORMAL HIGH (ref 0–22)

## 2023-03-19 LAB — C-REACTIVE PROTEIN: CRP: 14 mg/dL — ABNORMAL HIGH (ref <1.0)

## 2023-03-19 NOTE — Discharge Planning (Signed)
Oletta Cohn, RN, BSN, [place] [phone] Pt qualifies for DME (Durable Medical Equipment) wheelchair.  DME  ordered through [employer].  Jermaine of [employer] notified to deliver DME to pt home.

## 2023-03-19 NOTE — Progress Notes (Signed)
Pt's Auth is still pending at this time.

## 2023-03-19 NOTE — ED Notes (Signed)
Pt found to be incontinent of urine. While providing pericare, pt had second episode of urinary incontinence. Pt cleaned and linens changed. Pt repositioned for comfort.

## 2023-03-19 NOTE — Consult Note (Signed)
Chief Complaint: Patient was seen in consultation today for right suprapatellar fluid collection.  Referring Physician(s): Charma Igo, PA-C  Supervising Physician: Pernell Dupre  Patient Status: [facility] - ED  History of Present Illness: Monica Erickson is a 77 y.o. female with a past medical history significant for sleep apnea, depression and dementia who presented to [facility] ED 03/16/23 with complaints of chest pain, new difficulty walking and possible fall. Work up for all the above was essentially negative and the patient has remained in the ED awaiting SNF placement. She was seen by the ED physician today who noted increased swelling and redness to her right knee. CT right knee 03/16/23 showed:  1. Total knee arthroplasty. No acute fracture. 2. Moderate suprapatellar effusion  Given recent worsened swelling, tenderness and erythema orthopedic surgery was consulted for possible aspiration. However, given her degree of agitation 2/2 dementia IR has been consulted for aspiration of the suprapatellar effusion with moderate sedation.  Patient seen in the ED, initially sleeping but arouses easily to verbal cues. Pleasant, calm, follows simple commands. Becomes agitated when right knee is touched.  Called patient's POA, daughter Monica Erickson, to discuss procedure. She notes that patient has been more agitated than usual because she's in pain, she is very calm and sweet normally but does become upset/agitated when she is in pain. She notes that she is very strong and normally very mobile, walks up and down multiple flights of stairs daily usually but lately has been unable to really walk much at all. Reviewed aspiration procedure with moderate sedation which she is agreeable to.  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 pylori infection 03/2020   Kidney stones    Night terrors    Sleep apnea    no cpap    Past Surgical History:  Procedure Laterality Date   BIOPSY  03/22/2020   Procedure: BIOPSY;  Surgeon: Rachael Fee, MD;  Location: [facility];  Service: Endoscopy;;   Breast Lift     CATARACT EXTRACTION Bilateral 01/2020   COLONOSCOPY WITH PROPOFOL N/A 03/22/2020   Procedure: COLONOSCOPY WITH PROPOFOL;  Surgeon: Rachael Fee, MD;  Location: [facility];  Service: Endoscopy;  Laterality: N/A;   CYSTOSCOPY WITH STENT PLACEMENT Right 12/07/2018   Procedure: CYSTOSCOPY WITH STENT PLACEMENT;  Surgeon: Crist Fat, MD;  Location: [facility];  Service: Urology;  Laterality: Right;   CYSTOSCOPY/URETEROSCOPY/HOLMIUM LASER/STENT PLACEMENT Right 12/31/2018   Procedure: CYSTOSCOPY RIGHT URETEROSCOPY/HOLMIUM LASER RIGHT Lucretia Roers;  Surgeon: Crist Fat, MD;  Location: [facility];  Service: Urology;  Laterality: Right;   ESOPHAGOGASTRODUODENOSCOPY (EGD) WITH PROPOFOL N/A 03/22/2020   Procedure: ESOPHAGOGASTRODUODENOSCOPY (EGD) WITH PROPOFOL;  Surgeon: Rachael Fee, MD;  Location: [facility];  Service: Endoscopy;  Laterality: N/A;   REPLACEMENT TOTAL KNEE Bilateral 5 YRS AGO   Tummy tuck      Allergies: Patient has no known allergies.  Medications: Prior to Admission medications   Medication Sig Start Date End Date Taking? Authorizing Provider  cyanocobalamin (VITAMIN B12) 1000 MCG/ML injection Inject 1,000 mcg into the muscle once a week.  08/05/24 Yes [provider]  diazepam (VALIUM) 2 MG tablet Take 2 mg by mouth 2 (two) times daily as needed for anxiety  or sedation. 10/14/22  Yes [provider]  Ergocalciferol 50 MCG (2000 UT) TABS Take 2,000 Units by mouth daily at 12 noon. 02/12/23 03/12/24 Yes [provider]     Family History  Problem Relation Age of Onset   Diabetes Mother    Hypertension Father    Colon cancer Neg Hx     Esophageal cancer Neg Hx    Stomach cancer Neg Hx    Rectal cancer Neg Hx     Social History   Socioeconomic History   Marital status: Single    Spouse name: Not on file   Number of children: 3   Years of education: Not on file   Highest education level: Not on file  Occupational History   Occupation: retired  Tobacco Use   Smoking status: Former    Years: 5    Types: Cigarettes   Smokeless tobacco: Never  Building services engineer Use: Never used  Substance and Sexual Activity   Alcohol use: Not Currently    Comment: occasional wine   Drug use: Never   Sexual activity: Not Currently  Other Topics Concern   Not on file  Social History Narrative   Not on file   Social Determinants of Health   Financial Resource Strain: Not on file  Food Insecurity: Not on file  Transportation Needs: Not on file  Physical Activity: Not on file  Stress: Not on file  Social Connections: Not on file     Review of Systems: A 12 point ROS discussed and pertinent positives are indicated in the HPI above.  All other systems are negative.  Review of Systems  Constitutional:        Limited ROS due to dementia  Respiratory:  Negative for shortness of breath.   Cardiovascular:  Negative for chest pain.  Musculoskeletal:        (+) right knee pain    Vital Signs: BP (!) 138/90 (BP Location: Right Arm)   Pulse 87   Temp 98.9 F (37.2 C) (Oral)   Resp 16   SpO2 100%   Physical Exam Vitals and nursing note reviewed.  Constitutional:      General: She is not in acute distress. HENT:     Head: Normocephalic.     Mouth/Throat:     Mouth: Mucous membranes are moist.     Pharynx: Oropharynx is clear. No oropharyngeal exudate or posterior oropharyngeal erythema.  Cardiovascular:     Rate and Rhythm: Normal rate.  Pulmonary:     Effort: Pulmonary effort is normal.  Musculoskeletal:     Comments: (+) bilateral knee swelling, R>>L. Left knee non-tender TTP. Right knee exquisitely  tender to palpation, moderate erythema, mild warmth, no open wounds.  Skin:    General: Skin is warm and dry.  Neurological:     Mental Status: She is alert. She is disoriented.      MD Evaluation Airway: WNL Heart: WNL Abdomen: WNL Chest/ Lungs: WNL ASA  Classification: 3 Mallampati/Airway Score: Two   Imaging: VAS [place] LOWER EXTREMITY VENOUS (DVT) (ONLY [facility])  Result Date: 03/17/2023  Lower Venous DVT Study Patient Name:  SANDI MIELES  Date of Exam:   03/17/2023 Medical Rec #: [id number]    Accession #:    [id number] Date of Birth: 1946-05-15     Patient Gender: F Patient Age:   55 years Exam Location:  [facility] Procedure:      VAS [place] LOWER EXTREMITY VENOUS (DVT)  Referring Phys: Alvira Monday --------------------------------------------------------------------------------  Indications: Pain. Other Indications: History of PE - chronic appearing emboli documented 2023. Limitations: Patient guarding, altered mental status. Comparison Study: 02-24-2023 Prior bilateral lower extremity venous study was                   negative for DVT. Performing Technologist: Jean Rosenthal RDMS, RVT  Examination Guidelines: A complete evaluation includes B-mode imaging, spectral Doppler, color Doppler, and power Doppler as needed of all accessible portions of each vessel. Bilateral testing is considered an integral part of a complete examination. Limited examinations for reoccurring indications may be performed as noted. The reflux portion of the exam is performed with the patient in reverse Trendelenburg.  +---------+---------------+---------+-----------+----------+--------------+ RIGHT    CompressibilityPhasicitySpontaneityPropertiesThrombus Aging +---------+---------------+---------+-----------+----------+--------------+ CFV      Full           Yes      Yes                                 +---------+---------------+---------+-----------+----------+--------------+ SFJ      Full                                                         +---------+---------------+---------+-----------+----------+--------------+ FV Prox  Full                                                        +---------+---------------+---------+-----------+----------+--------------+ FV Mid   Full                                                        +---------+---------------+---------+-----------+----------+--------------+ FV DistalFull                                                        +---------+---------------+---------+-----------+----------+--------------+ PFV      Full                                                        +---------+---------------+---------+-----------+----------+--------------+ POP      Full           Yes      Yes                                 +---------+---------------+---------+-----------+----------+--------------+ PTV      Full                                                        +---------+---------------+---------+-----------+----------+--------------+  PERO     Full                                                        +---------+---------------+---------+-----------+----------+--------------+   +---------+---------------+---------+-----------+----------+-------------------+ LEFT     CompressibilityPhasicitySpontaneityPropertiesThrombus Aging      +---------+---------------+---------+-----------+----------+-------------------+ CFV                                                   Unable to                                                                 visualize- patient                                                        guarding/grabbing   +---------+---------------+---------+-----------+----------+-------------------+ SFJ                                                   Unable to                                                                 visualize- patient                                                         guarding/grabbing   +---------+---------------+---------+-----------+----------+-------------------+ FV Prox  Full                                                             +---------+---------------+---------+-----------+----------+-------------------+ FV Mid   Full                                                             +---------+---------------+---------+-----------+----------+-------------------+ FV DistalFull                                                             +---------+---------------+---------+-----------+----------+-------------------+  PFV      Full                                                             +---------+---------------+---------+-----------+----------+-------------------+ POP      Full           Yes      Yes                                      +---------+---------------+---------+-----------+----------+-------------------+ PTV      Full                                                             +---------+---------------+---------+-----------+----------+-------------------+ PERO     Full                                                             +---------+---------------+---------+-----------+----------+-------------------+     Summary: RIGHT: - There is no evidence of deep vein thrombosis in the lower extremity.  - No cystic structure found in the popliteal fossa.  LEFT: - There is no evidence of deep vein thrombosis in the lower extremity. However, portions of this examination were limited- see technologist comments above.  - No cystic structure found in the popliteal fossa.  *See table(s) above for measurements and observations. Electronically signed by Lemar Livings MD on 03/17/2023 at 5:53:34 PM.    Final    CT Head Wo Contrast  Result Date: 03/17/2023 CLINICAL DATA:  Altered mental status EXAM: CT HEAD WITHOUT CONTRAST TECHNIQUE: Contiguous axial images were obtained from the base of the  skull through the vertex without intravenous contrast. RADIATION DOSE REDUCTION: This exam was performed according to the departmental dose-optimization program which includes automated exposure control, adjustment of the mA and/or kV according to patient size and/or use of iterative reconstruction technique. COMPARISON:  02/04/2023 FINDINGS: Brain: There is no mass, hemorrhage or extra-axial collection. There is generalized atrophy without lobar predilection. Hypodensity of the white matter is most commonly associated with chronic microvascular disease. Bilateral basal ganglia mineralization Vascular: No abnormal hyperdensity of the major intracranial arteries or dural venous sinuses. No intracranial atherosclerosis. Skull: The visualized skull base, calvarium and extracranial soft tissues are normal. Sinuses/Orbits: No fluid levels or advanced mucosal thickening of the visualized paranasal sinuses. No mastoid or middle ear effusion. The orbits are normal. IMPRESSION: 1. No acute intracranial abnormality. 2. Generalized atrophy and findings of chronic microvascular disease. Electronically Signed   By: Deatra Robinson M.D.   On: 03/17/2023 01:48   CT Knee Right Wo Contrast  Result Date: 03/16/2023 CLINICAL DATA:  Knee trauma. EXAM: CT OF THE RIGHT KNEE WITHOUT CONTRAST TECHNIQUE: Multidetector CT imaging of the right knee was performed according to the standard protocol. Multiplanar CT image reconstructions were also generated. RADIATION DOSE REDUCTION: This exam was performed according to  the departmental dose-optimization program which includes automated exposure control, adjustment of the mA and/or kV according to patient size and/or use of iterative reconstruction technique. COMPARISON:  Right knee radiograph dated 03/16/2023. FINDINGS: Evaluation is limited due to streak artifact caused by arthroplasty. Bones/Joint/Cartilage There is a total knee arthroplasty. The arthroplasty components appear intact and in  anatomic alignment. No acute fracture. Evaluation for fracture is limited due to streak artifact caused by arthroplasty, osteopenia, and motion. There is a moderate suprapatellar effusion. Ligaments Suboptimally assessed by CT. Muscles and Tendons No acute findings. Soft tissues No acute findings. IMPRESSION: 1. Total knee arthroplasty. No acute fracture. 2. Moderate suprapatellar effusion. Electronically Signed   By: Elgie Collard M.D.   On: 03/16/2023 19:16   CT L-SPINE NO CHARGE  Result Date: 03/16/2023 CLINICAL DATA:  Abdominal pain, acute, nonlocalized. EXAM: CT LUMBAR SPINE WITHOUT CONTRAST TECHNIQUE: Technique: Multiplanar CT images of the lumbar spine were reconstructed from contemporary CT of the Abdomen and Pelvis. RADIATION DOSE REDUCTION: This exam was performed according to the departmental dose-optimization program which includes automated exposure control, adjustment of the mA and/or kV according to patient size and/or use of iterative reconstruction technique. CONTRAST:  None. COMPARISON:  Lumbar spine CT 01/27/2023 FINDINGS: Segmentation: Transitional lumbosacral anatomy with partially sacralized L5. Alignment: Normal. Vertebrae: No acute fracture or suspicious osseous lesion. Paraspinal and other soft tissues: No acute abnormality identified in the paraspinal soft tissues. Remainder of the abdomen and pelvis reported separately. 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 Moderate spinal stenosis at L2-3 and L3-4 and severe spinal stenosis at L4-5, similar to the prior study. Mild-to-moderate multilevel neural foraminal stenosis. IMPRESSION: 1. No acute osseous abnormality identified in the lumbar spine. 2. 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 Electronically Signed   By: Sebastian Ache M.D.   On: 03/16/2023 12:11   CT ABDOMEN PELVIS WO CONTRAST  Result Date: 03/16/2023 CLINICAL DATA:  Abdominal pain EXAM: CT  ABDOMEN AND PELVIS WITHOUT CONTRAST TECHNIQUE: Multidetector CT imaging of the abdomen and pelvis was performed following the standard protocol without IV contrast. RADIATION DOSE REDUCTION: This exam was performed according to the departmental dose-optimization program which includes automated exposure control, adjustment of the mA and/or kV according to patient size and/or use of iterative reconstruction technique. COMPARISON:  CT 02/04/2023 FINDINGS: Lower chest: Linear opacity lung bases likely scar or atelectasis. No pleural effusion. Breathing motion. Heart is enlarged. Trace pericardial fluid. Small hiatal hernia. Hepatobiliary: On this non IV contrast exam, the liver is grossly preserved. Stones in the gallbladder. Pancreas: Unremarkable. No pancreatic ductal dilatation or surrounding inflammatory changes. Spleen: Normal in size without focal abnormality. Small splenule noted inferior to the spleen. Adrenals/Urinary Tract: Stable nodularity to the adrenal gland on the right. Left adrenal gland is preserved. Punctate left-sided nonobstructing renal stones. No ureteral stones. Preserved contours of the urinary bladder. Stomach/Bowel: Significant stool in the rectum. Scattered colonic diverticula greatest along the sigmoid region. No bowel obstruction, free air or free fluid. The stomach is collapsed. Small bowel is nondilated. Normal appendix in the right lower quadrant lateral to the cecum. Best seen on coronal imaging. Vascular/Lymphatic: Normal caliber aorta and IVC with mild vascular calcifications. No specific abnormal lymph node enlargement seen in the abdomen and pelvis. Reproductive: Status post hysterectomy. No adnexal masses. Other: No free air or free fluid. There is artifact along the upper abdomen limiting evaluation with both arms crossed over the anterior abdomen. Patient is tilted. Musculoskeletal: Scattered degenerative  changes along the spine. IMPRESSION: No bowel obstruction, free air or  free fluid. Few colonic diverticula. Small hiatal hernia. Punctate nonobstructing left-sided renal stones. Electronically Signed   By: Karen Kays M.D.   On: 03/16/2023 12:06   DG Knee Complete 4 Views Right  Result Date: 03/16/2023 CLINICAL DATA:  Knee pain, decreased mobility EXAM: RIGHT KNEE - COMPLETE 4+ VIEW COMPARISON:  None Available. FINDINGS: Osseous demineralization. Components of a RIGHT knee prosthesis identified. Moderate joint effusion with inferior patellar spurring noted. No acute fracture, dislocation, or bone destruction. IMPRESSION: RIGHT knee prosthesis with moderate joint effusion. No acute osseous abnormalities. Electronically Signed   By: Ulyses Southward M.D.   On: 03/16/2023 12:04   DG Hips Bilat W or Wo Pelvis 2 Views  Result Date: 03/16/2023 CLINICAL DATA:  Knee pain, impaired mobility EXAM: DG HIP (WITH OR WITHOUT PELVIS) 2V BILAT COMPARISON:  None Available. FINDINGS: Osseous demineralization. Hip and SI joint spaces preserved. No acute fracture, dislocation, or bone destruction. IMPRESSION: No acute osseous abnormalities. Electronically Signed   By: Ulyses Southward M.D.   On: 03/16/2023 11:41   DG Foot Complete Left  Result Date: 03/16/2023 CLINICAL DATA:  Decreased mobility, chest pain EXAM: LEFT FOOT - COMPLETE 3+ VIEW COMPARISON:  None Available. FINDINGS: Osseous demineralization. Scattered narrowing of IP joints. Minimal narrowing of first MTP joint. No acute fracture, dislocation, or bone destruction. IMPRESSION: Osseous demineralization with minimal degenerative changes as above. No acute abnormalities. Electronically Signed   By: Ulyses Southward M.D.   On: 03/16/2023 11:40   DG Chest 1 View  Result Date: 03/16/2023 CLINICAL DATA:  Chest pain EXAM: CHEST  1 VIEW COMPARISON:  CXR 02/04/23 FINDINGS: No pleural effusion. No pneumothorax. No focal airspace opacity. Unchanged asymmetric elevation of the right hemidiaphragm. Normal cardiac and mediastinal contours. No radiographically  apparent displaced rib fractures. Visualized upper abdomen is notable for colonic gaseous distention. IMPRESSION: No focal airspace opacity Electronically Signed   By: Lorenza Cambridge M.D.   On: 03/16/2023 11:08    Labs:  CBC: Recent Labs    01/27/23 1942 02/04/23 0818 03/16/23 1105 03/19/23 1329  WBC 5.1 7.0 9.8 6.8  HGB 12.8 13.2 12.4 11.8*  HCT 39.7 41.2 37.8 35.6*  PLT 193 224 274 313    COAGS: No results for input(s): "INR", "APTT" in the last 8760 hours.  BMP: Recent Labs    01/27/23 1942 02/04/23 0818 03/16/23 1105  NA 137 142 134*  K 4.2 4.1 3.7  CL 107 109 97*  CO2 22 24 22   GLUCOSE 99 117* 122*  BUN 13 21 16   CALCIUM 8.9 9.3 9.1  CREATININE 0.91 1.04* 1.25*  GFRNONAA >60 56* 45*    LIVER FUNCTION TESTS: Recent Labs    02/04/23 0818 03/16/23 1105  BILITOT 1.3* 1.5*  AST 16 22  ALT 9 11  ALKPHOS 87 91  PROT 8.4* 8.1  ALBUMIN 4.5 3.3*    TUMOR MARKERS: No results for input(s): "AFPTM", "CEA", "CA199", "CHROMGRNA" in the last 8760 hours.  Assessment and Plan:  77 y/o F with history of dementia who presented to the ED 03/16/23 with chest pain, trouble walking and possible fall. She was found to have right suprapatellar effusion but otherwise workup was essentially negative - she remains in the ED awaiting SNF placement. On re-evaluation by EDP today she was noted to have increasing tenderness, erythema and edema to the right knee concerning for infection. Given her degree of agitation with palpation of the right  knee orthopedic surgery and ED not comfortable with bedside aspiration, as such IR has been consulted for image guided aspiration with moderate sedation. Patient history and imaging reviewed by Dr. Juliette Alcide who approves procedure.  Plan for US guided suprapatellar fluid collection aspiration/knee arthrocentesis 5/10 in IR with moderate sedation due to agitation.  - NPO at midnight, may have sips with meds - May continue anticoagulation if  applicable - No pre-procedure labs indicated - IR will call for patient when ready  Risks and benefits discussed with the patient's daughter [name] via phone including bleeding, infection, damage to adjacent structures  All of the patient's daughter's questions were answered and she is agreeable to proceed.  Verbal consent obtained and placed in IR black box.  Thank you for this interesting consult.  I greatly enjoyed meeting Clytie Staniszewski and look forward to participating in their care.  A copy of this report was sent to the requesting provider on this date.  Electronically Signed: Villa Herb, PA-C 03/19/2023, 3:54 PM   I spent a total of 40 Minutes  in face to face in clinical consultation, greater than 50% of which was counseling/coordinating care for right knee effusion.

## 2023-03-19 NOTE — ED Provider Notes (Addendum)
Emergency Medicine Observation Re-evaluation Note  Monica Erickson is a 77 y.o. female, seen on rounds today.  Pt initially presented to the ED for complaints of Chest Pain and Multiple Complaints Currently, the patient is sleeping.  Physical Exam  BP (!) 138/90 (BP Location: Right Arm)   Pulse 87   Temp 98.9 F (37.2 C) (Oral)   Resp 16   SpO2 100%  Physical Exam General: No distress Cardiac: regular rate Lungs: equal chest rise MSK: Right knee appears swollen with painful range of motion, also warm Psych: calm  ED Course / MDM  EKG:EKG Interpretation  Date/Time:  Monday Mar 16 2023 08:58:09 EDT Ventricular Rate:  102 PR Interval:  175 QRS Duration: 89 QT Interval:  323 QTC Calculation: 421 R Axis:   72 Text Interpretation: Sinus tachycardia Probable left atrial enlargement Anterior infarct, old heart rate has increased since first prior tracing of February 04, 2023 Confirmed by Margarita Grizzle [phone]) on 03/16/2023 9:28:57 AM  I have reviewed the labs performed to date as well as medications administered while in observation.  Recent changes in the last 24 hours include denied SNF placement.   Plan  Patient was denied SNF placement. On re-assessment noted R knee appears swollen and she has minimal ROM. It appears as if on initial ER visit knee was tender but ROM was maybe not so limited and no large effusion. Discuss with Charma Igo PA-C with orthopedics who has consulted and concerned about infection as well. Recommends IR arthrocentesis. Hold off antibiotics until fluid obtained. Repeat labs with no leukocytosis but elevated ESR/CRP. I asked hospitalist Dr. Ophelia Charter to evaluate patient and she has declined admission. Patient will still need IR arthrocentesis. She will continue boarding in ER for now. She is not NPO so will be performed  by IR tomorrow.    Lonell Grandchild, MD 03/19/23 1914    Lonell Grandchild, MD 03/19/23 1343    Lonell Grandchild, MD 03/19/23 [phone]

## 2023-03-19 NOTE — Progress Notes (Signed)
Physical Therapy Treatment Patient Details Name: Monica Erickson MRN: [id number] DOB: [birth year] Today's Date: 03/19/2023   History of Present Illness 77 y.o. female with a history of dementia.  She is cared for by her daughter Monica Erickson who is at the bedside.  Per daughter patient has been less active than usual and appears to be in pain.  She has had no direct trauma, falls, redness, or abnormalities noted.  Patient complains of diffuse pain. Pt found to have rt knee effusion and IR consulted for aspiration of knee. PMH significant of dementia, chronic headaches, depression, gastric ulcer, H. pylori infection, nephrolithiasis, sleep apnea not on CPAP.  Recently seen in the ED on 4/5 for dehydration, UTI, and possible H. pylori infection.    PT Comments    Pt not following commands and resisting any mobility. Pt to have rt knee aspirated tomorrow. If that doesn't result in patient being able to tolerate more and demonstrate some progress will have to dc from PT. Will see how she does next session.    Recommendations for follow up therapy are one component of a multi-disciplinary discharge planning process, led by the attending physician.  Recommendations may be updated based on patient status, additional functional criteria and insurance authorization.  Follow Up Recommendations       Assistance Recommended at Discharge Frequent or constant Supervision/Assistance  Patient can return home with the following Two people to help with walking and/or transfers;Two people to help with bathing/dressing/bathroom;Assistance with cooking/housework;Assistance with feeding;Direct supervision/assist for medications management;Direct supervision/assist for financial management;Assist for transportation;Help with stairs or ramp for entrance   Equipment Recommendations  Other (comment);Hospital bed;Wheelchair cushion (measurements PT);Wheelchair (measurements PT) (hoyer lift)    Recommendations for Other Services        Precautions / Restrictions Precautions Precautions: Fall     Mobility  Bed Mobility Overal bed mobility: Needs Assistance             General bed mobility comments: Attempted to get pt sitting EOB. Able to get heels off edge of bed. Attempted to elevate trunk into sitting with max assist but pt resistant to movement and unable to get into sitting.    Transfers                   General transfer comment: Unable to attempt    Ambulation/Gait                   Stairs             Wheelchair Mobility    Modified Rankin (Stroke Patients Only)       Balance                                            Cognition Arousal/Alertness: Awake/alert Behavior During Therapy: Restless Overall Cognitive Status: No family/caregiver present to determine baseline cognitive functioning Area of Impairment: Orientation, Attention, Memory, Following commands, Safety/judgement, Problem solving, Awareness                 Orientation Level: Disoriented to, Person, Place, Time, Situation Current Attention Level: Focused   Following Commands:  (does not follow commands)                Exercises      General Comments        Pertinent Vitals/Pain Pain Assessment Pain Assessment: Faces Faces Pain Scale:  Hurts even more Pain Location: Appears to be rt knee as well as generalized with movement. Pain Descriptors / Indicators: Grimacing, Moaning Pain Intervention(s): Limited activity within patient's tolerance, Monitored during session, Repositioned    Home Living                          Prior Function            PT Goals (current goals can now be found in the care plan section) Progress towards PT goals: Not progressing toward goals - comment    Frequency    Min 2X/week      PT Plan Current plan remains appropriate    Co-evaluation              AM-PAC PT "6 Clicks" Mobility   Outcome Measure   Help needed turning from your back to your side while in a flat bed without using bedrails?: Total Help needed moving from lying on your back to sitting on the side of a flat bed without using bedrails?: Total Help needed moving to and from a bed to a chair (including a wheelchair)?: Total Help needed standing up from a chair using your arms (e.g., wheelchair or bedside chair)?: Total Help needed to walk in hospital room?: Total Help needed climbing 3-5 steps with a railing? : Total 6 Click Score: 6    End of Session   Activity Tolerance: Other (comment);Patient limited by pain (cognition and inability to follow commands) Patient left: in bed;with call bell/phone within reach;with bed alarm set Nurse Communication: Mobility status PT Visit Diagnosis: Other abnormalities of gait and mobility (R26.89);Difficulty in walking, not elsewhere classified (R26.2)     Time: 4098-1191 PT Time Calculation (min) (ACUTE ONLY): 11 min  Charges:  $Therapeutic Activity: 8-22 mins                     [facility] PT Acute Rehabilitation Services Office [phone]    Angelina Ok [facility] 03/19/2023, 5:15 PM

## 2023-03-19 NOTE — Consult Note (Signed)
ER Consult Note   Patient: Monica Erickson ZOX:[id number] DOB: [birth year] DOA: 03/16/2023 DOS: the patient was seen and examined on 03/19/2023 PCP: Nona Dell, NP  Patient coming from: Home - lives with family; NOK: Daughter, [name], [phone]   Chief Complaint: knee pain  HPI: Monica Erickson is a 77 y.o. female with medical history significant of advanced dementia and OSA not on CPAP who presented on 5/6 with diffuse complaints including knee pain.  She has been in the ER awaiting placement since and now there is concern for cellulitis and possible septic arthritis.   Of note, she has been determined to NOT qualify for SNF placement and home health services have been arranged.  The patient is very pleasant, sitting up in bed eating/drinking without difficulty.  She is minimally verbal, mostly nonsensical, but very pleasant.    ER Course:  ER boarder x 3 days.  Chronic dementia, non-verbal.  Family wants to take her home but difficulty walking.  Significant and unrevealing work-up.  Insurance denied rehab since she can't follow commands.  Knee rechecked - mildly red, swollen, difficulty moving it.  Orthopedics somewhat concerned.  Did not want to do ER sedation, awaiting IR evaluation.       Review of Systems: unable to review all systems due to the inability of the patient to answer questions. 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 pylori infection 03/2020   Kidney stones    Night terrors    Sleep apnea    no cpap   Past Surgical History:  Procedure Laterality Date   BIOPSY  03/22/2020   Procedure: BIOPSY;  Surgeon: Rachael Fee, MD;  Location: [facility];  Service: Endoscopy;;   Breast Lift     CATARACT EXTRACTION Bilateral 01/2020   COLONOSCOPY WITH PROPOFOL N/A 03/22/2020    Procedure: COLONOSCOPY WITH PROPOFOL;  Surgeon: Rachael Fee, MD;  Location: [facility];  Service: Endoscopy;  Laterality: N/A;   CYSTOSCOPY WITH STENT PLACEMENT Right 12/07/2018   Procedure: CYSTOSCOPY WITH STENT PLACEMENT;  Surgeon: Crist Fat, MD;  Location: [facility];  Service: Urology;  Laterality: Right;   CYSTOSCOPY/URETEROSCOPY/HOLMIUM LASER/STENT PLACEMENT Right 12/31/2018   Procedure: CYSTOSCOPY RIGHT URETEROSCOPY/HOLMIUM LASER RIGHT Lucretia Roers;  Surgeon: Crist Fat, MD;  Location: [facility];  Service: Urology;  Laterality: Right;   ESOPHAGOGASTRODUODENOSCOPY (EGD) WITH PROPOFOL N/A 03/22/2020   Procedure: ESOPHAGOGASTRODUODENOSCOPY (EGD) WITH PROPOFOL;  Surgeon: Rachael Fee, MD;  Location: [facility];  Service: Endoscopy;  Laterality: N/A;   REPLACEMENT TOTAL KNEE Bilateral 5 YRS AGO   Tummy tuck     Social History:  reports that she has quit smoking. Her smoking use included cigarettes. She has never used smokeless tobacco. She reports that she does not currently use alcohol. She reports that she does not use drugs.  No Known Allergies  Family History  Problem Relation Age of Onset   Diabetes Mother    Hypertension Father    Colon cancer Neg Hx    Esophageal cancer Neg Hx    Stomach cancer Neg Hx    Rectal cancer Neg Hx     Prior to Admission medications   Medication Sig Start Date End Date Taking? Authorizing Provider  cyanocobalamin (VITAMIN  B12) 1000 MCG/ML injection Inject 1,000 mcg into the muscle once a week.  08/05/24 Yes [provider]  diazepam (VALIUM) 2 MG tablet Take 2 mg by mouth 2 (two) times daily as needed for anxiety or sedation. 10/14/22  Yes [provider]  Ergocalciferol 50 MCG (2000 UT) TABS Take 2,000 Units by mouth daily at 12 noon. 02/12/23 03/12/24 Yes [provider]    Physical Exam: Vitals:   03/17/23 1937 03/18/23 0615 03/18/23 1715 03/19/23 0846  BP: (!) 166/95 132/72  132/73 (!) 138/90  Pulse: (!) 102 89 80 87  Resp: 18 18 16 16   Temp:  99.5 F (37.5 C)  98.9 F (37.2 C)  TempSrc:  Oral  Oral  SpO2: 100% 100% 100% 100%   General:  Appears calm and comfortable and is in NAD Eyes:   EOMI, normal lids, iris ENT:  grossly normal hearing, lips & tongue, mmm Neck:  no LAD, masses or thyromegaly Cardiovascular:  RRR, no m/r/g. No LE edema.  Respiratory:   CTA bilaterally with no wheezes/rales/rhonchi.  Normal respiratory effort. Abdomen:  soft, NT, ND Skin:  no rash or induration seen on limited exam Musculoskeletal:  s/p B knee replacements.  R knee with apparent effusion.  No erythema.  Limited ROM due to discomfort from effusion.   Psychiatric:  pleasant affect, speech minimal and mostly nonsensical Neurologic:  no obvious deficits but limited by advanced dementia   Radiological Exams on Admission: Independently reviewed - see discussion in A/P where applicable  No results found.  EKG: none since 5/6   Labs on Admission: I have personally reviewed the available labs and imaging studies at the time of the admission.  Pertinent labs:    CK 184 CRP 14 Lactate 1.3 WBC 6.8 ESR 94   Assessment and Plan: Principal Problem:   Knee effusion, right Active Problems:   Dementia without behavioral disturbance (HCC)   OSA (obstructive sleep apnea)    R knee effusion -Patient with advanced dementia presenting several days prior with ambulatory difficulty and apparently diffuse pain -Her R knee had an effusion on presentation without apparent erythema and there is none present now -This may be a septic arthritis but she has normal vitals and no labs back (CRP just came back elevated)  -Will await joint aspiration and if positive will be happy to admit patient for ongoing treatment -For now, will leave as ER boarder pending further information  Dementia -Advanced dementia on evaluation but very pleasant -She has Valium on her med rec so  apparently does have some baseline associated behavioral impairment -Will need delirium precautions if admitted  OSA -Not on CPAP   Thank you for this interesting consult.  The patient may continue to board in the ER pending further evaluation.  Should admission be needed after joint aspiration, please re-consult at that time.   Author: Jonah Blue, MD 03/19/2023 5:02 PM  For on call review [email or web address].

## 2023-03-19 NOTE — Progress Notes (Addendum)
Pt's insurance auth escelated to Peer to Peer. Notified EDP via secure chat.   Provider will have to call [phone], option 5. Deadline is today at 2:00 PM. Please provide pt's name, DOB, and member ID ([id number]).   Addend @ 11:50 AM This CSW was informed by EDP that the pt was denied. EDP did reassess pt and place ortho consult per daughter's request. This Clinical research associate notified pt's daughter. RNCM to outreach to EDP regarding w/c. This CSW has outreached to [place] at [place] to provide an update. TOC following for any recs by Ortho team.   Addend @ 1:59 PM Confirmed with EDP pt will be admitted. TOC on inpatient side to follow for discharge needs. EDP informed he has already spoken with daughter. Updated Elnita Maxwell with [facility] that pt will be admitted.   Contacted pt's daughter and informed to provide Fast Track Appeal information.  Phone- [phone] Fax- [phone]

## 2023-03-19 NOTE — Consult Note (Addendum)
Reason for Consult:Right knee effusion Referring Physician: Alvino Blood Time called: 1325 Time at bedside: 1334   Monica Erickson is an 77 y.o. female.  HPI: Monica Erickson was brought to the ED 5/6 with CP and decreased mobility. Workup was essentially negative and she's been waiting on SNF placement. EDP who saw her today noted some redness to right knee and asked for orthopedic consulted. She has advanced dementia and cannot contribute reliably to history.  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 pylori infection 03/2020   Kidney stones    Night terrors    Sleep apnea    no cpap    Past Surgical History:  Procedure Laterality Date   BIOPSY  03/22/2020   Procedure: BIOPSY;  Surgeon: Rachael Fee, MD;  Location: [facility];  Service: Endoscopy;;   Breast Lift     CATARACT EXTRACTION Bilateral 01/2020   COLONOSCOPY WITH PROPOFOL N/A 03/22/2020   Procedure: COLONOSCOPY WITH PROPOFOL;  Surgeon: Rachael Fee, MD;  Location: [facility];  Service: Endoscopy;  Laterality: N/A;   CYSTOSCOPY WITH STENT PLACEMENT Right 12/07/2018   Procedure: CYSTOSCOPY WITH STENT PLACEMENT;  Surgeon: Crist Fat, MD;  Location: [facility];  Service: Urology;  Laterality: Right;   CYSTOSCOPY/URETEROSCOPY/HOLMIUM LASER/STENT PLACEMENT Right 12/31/2018   Procedure: CYSTOSCOPY RIGHT URETEROSCOPY/HOLMIUM LASER RIGHT Lucretia Roers;  Surgeon: Crist Fat, MD;  Location[facility];  Service: Urology;  Laterality: Right;   ESOPHAGOGASTRODUODENOSCOPY (EGD) WITH PROPOFOL N/A 03/22/2020   Procedure: ESOPHAGOGASTRODUODENOSCOPY (EGD) WITH PROPOFOL;  Surgeon: Rachael Fee, MD;  Location: [facility];  Service: Endoscopy;  Laterality: N/A;   REPLACEMENT TOTAL KNEE Bilateral 5 YRS AGO   Tummy tuck       Family History  Problem Relation Age of Onset   Diabetes Mother    Hypertension Father    Colon cancer Neg Hx    Esophageal cancer Neg Hx    Stomach cancer Neg Hx    Rectal cancer Neg Hx     Social History:  reports that she has quit smoking. Her smoking use included cigarettes. She has never used smokeless tobacco. She reports that she does not currently use alcohol. She reports that she does not use drugs.  Allergies: No Known Allergies  Medications: I have reviewed the patient's current medications.  No results found for this or any previous visit (from the past 48 hour(s)).  No results found.  Review of Systems  Unable to perform ROS: Dementia   Blood pressure (!) 138/90, pulse 87, temperature 98.9 F (37.2 C), temperature source Oral, resp. rate 16, SpO2 100 %. Physical Exam Constitutional:      General: She is not in acute distress.    Appearance: She is well-developed. She is not diaphoretic.  HENT:     Head: Normocephalic and atraumatic.  Eyes:     General: No scleral icterus.       Right eye: No discharge.        Left eye: No discharge.     Conjunctiva/sclera: Conjunctivae normal.  Cardiovascular:     Rate and Rhythm: Normal rate and regular rhythm.  Pulmonary:     Effort: Pulmonary effort is normal. No respiratory distress.  Musculoskeletal:     Cervical back:  Normal range of motion.     Comments: RLE No traumatic wounds, ecchymosis, or rash  Knee severe TTP, severe pain with attempted PROM  Mild knee effusion  Sens DPN, SPN, TN could not assess  Motor EHL, ext, flex, evers could not assess  DP 2+, PT 2+, No significant edema  Skin:    General: Skin is warm and dry.  Neurological:     Mental Status: She is alert.  Psychiatric:        Behavior: Behavior is agitated.     Assessment/Plan: Right knee effusion -- Do not believe she will allow arthrocentesis without sedation. EDP does not feel comfortable in area she's in so will ask IR to perform.      Freeman Caldron, PA-C Orthopedic Surgery [phone] 03/19/2023, 1:41 PM

## 2023-03-20 ENCOUNTER — Emergency Department (HOSPITAL_COMMUNITY): Payer: Medicare Other

## 2023-03-20 DIAGNOSIS — D649 Anemia, unspecified: Secondary | ICD-10-CM | POA: Diagnosis not present

## 2023-03-20 DIAGNOSIS — F02C0 Dementia in other diseases classified elsewhere, severe, without behavioral disturbance, psychotic disturbance, mood disturbance, and anxiety: Secondary | ICD-10-CM | POA: Diagnosis present

## 2023-03-20 DIAGNOSIS — T8453XD Infection and inflammatory reaction due to internal right knee prosthesis, subsequent encounter: Secondary | ICD-10-CM | POA: Diagnosis not present

## 2023-03-20 DIAGNOSIS — Z96652 Presence of left artificial knee joint: Secondary | ICD-10-CM | POA: Diagnosis present

## 2023-03-20 DIAGNOSIS — Y831 Surgical operation with implant of artificial internal device as the cause of abnormal reaction of the patient, or of later complication, without mention of misadventure at the time of the procedure: Secondary | ICD-10-CM | POA: Diagnosis present

## 2023-03-20 DIAGNOSIS — Z87891 Personal history of nicotine dependence: Secondary | ICD-10-CM | POA: Diagnosis not present

## 2023-03-20 DIAGNOSIS — Z96651 Presence of right artificial knee joint: Secondary | ICD-10-CM | POA: Diagnosis not present

## 2023-03-20 DIAGNOSIS — G4733 Obstructive sleep apnea (adult) (pediatric): Secondary | ICD-10-CM | POA: Diagnosis present

## 2023-03-20 DIAGNOSIS — T8453XA Infection and inflammatory reaction due to internal right knee prosthesis, initial encounter: Secondary | ICD-10-CM

## 2023-03-20 DIAGNOSIS — F0390 Unspecified dementia without behavioral disturbance: Secondary | ICD-10-CM | POA: Diagnosis not present

## 2023-03-20 DIAGNOSIS — Z833 Family history of diabetes mellitus: Secondary | ICD-10-CM | POA: Diagnosis not present

## 2023-03-20 DIAGNOSIS — Z751 Person awaiting admission to adequate facility elsewhere: Secondary | ICD-10-CM | POA: Diagnosis not present

## 2023-03-20 DIAGNOSIS — G4730 Sleep apnea, unspecified: Secondary | ICD-10-CM | POA: Diagnosis not present

## 2023-03-20 DIAGNOSIS — G309 Alzheimer's disease, unspecified: Secondary | ICD-10-CM | POA: Diagnosis present

## 2023-03-20 DIAGNOSIS — Z8249 Family history of ischemic heart disease and other diseases of the circulatory system: Secondary | ICD-10-CM | POA: Diagnosis not present

## 2023-03-20 DIAGNOSIS — M25461 Effusion, right knee: Secondary | ICD-10-CM | POA: Diagnosis present

## 2023-03-20 DIAGNOSIS — E871 Hypo-osmolality and hyponatremia: Secondary | ICD-10-CM | POA: Diagnosis present

## 2023-03-20 LAB — SYNOVIAL CELL COUNT + DIFF, W/ CRYSTALS
Eosinophils-Synovial: 0 % (ref 0–1)
Lymphocytes-Synovial Fld: 6 % (ref 0–20)
Monocyte-Macrophage-Synovial Fluid: 2 % — ABNORMAL LOW (ref 50–90)
Neutrophil, Synovial: 92 % — ABNORMAL HIGH (ref 0–25)
WBC, Synovial: 11150 /mm3 — ABNORMAL HIGH (ref 0–200)

## 2023-03-20 LAB — AEROBIC/ANAEROBIC CULTURE W GRAM STAIN (SURGICAL/DEEP WOUND)

## 2023-03-20 MED ORDER — DIAZEPAM 5 MG PO TABS
ORAL_TABLET | ORAL | Status: AC
  Filled 2023-03-20: qty 1

## 2023-03-20 MED ORDER — VANCOMYCIN HCL 750 MG/150ML IV SOLN: 750.0000 mg | INTRAVENOUS | Status: DC

## 2023-03-20 MED ORDER — DIAZEPAM 5 MG PO TABS
5.0000 mg | ORAL_TABLET | Freq: Once | ORAL | Status: AC
  Administered 2023-03-20: 5 mg via ORAL

## 2023-03-20 MED ORDER — VANCOMYCIN HCL 1250 MG/250ML IV SOLN
1250.0000 mg | Freq: Once | INTRAVENOUS | Status: AC
  Administered 2023-03-20: 1250 mg via INTRAVENOUS
  Filled 2023-03-20: qty 250

## 2023-03-20 MED ORDER — LIDOCAINE HCL 1 % IJ SOLN
INTRAMUSCULAR | Status: AC
  Filled 2023-03-20: qty 20

## 2023-03-20 NOTE — ED Notes (Signed)
ED TO INPATIENT HANDOFF REPORT  ED Nurse Name and Phone #: Elnita Maxwell [phone]  S Name/Age/Gender Monica Erickson 77 y.o. female Room/Bed: 039C/039C  Code Status   Code Status: Full Code  Home/SNF/Other Skilled nursing facility SW working on SNF or home health for pt, understanding that family cannot take care of her at home any longer Patient oriented to: self Is this baseline? Yes   Triage Complete: Triage complete  Chief Complaint Infection of prosthetic right knee joint (HCC) [T84.53XA]  Triage Note Pt arrived to ED via [facility] from home w/ multiple complaints including CP. Pt dementia baseline A&Ox1 (self). Family reported increased agitation, decreased mobility (ambulatory at baseline), bilateral swelling to knees and "at some point pt pointed to her chest and said tight tight". EMS reports family was agitated on scene. VSS w/ EMS. Ems reports pt gets agitated when messed w/.   Allergies No Known Allergies  Level of Care/Admitting Diagnosis ED Disposition     ED Disposition  Admit   Condition  --   Comment  Hospital Area: [facility] [[id number]]  Level of Care: Med-Surg [16]  May admit patient to [facility] or [facility] if equivalent level of care is available:: Yes  Covid Evaluation: Asymptomatic - no recent exposure (last 10 days) testing not required  Diagnosis: Infection of prosthetic right knee joint [facility]) [[id number]]  Admitting Physician: John Giovanni [[id number]]  Attending Physician: John Giovanni [[id number]]  Certification:: I certify this patient will need inpatient services for at least 2 midnights  Estimated Length of Stay: 2          B Medical/Surgery History Past Medical History:  Diagnosis Date   Chronic headaches    Dementia (HCC)    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 pylori infection 03/2020   Kidney stones     Night terrors    Sleep apnea    no cpap   Past Surgical History:  Procedure Laterality Date   BIOPSY  03/22/2020   Procedure: BIOPSY;  Surgeon: Rachael Fee, MD;  Location: [facility];  Service: Endoscopy;;   Breast Lift     CATARACT EXTRACTION Bilateral 01/2020   COLONOSCOPY WITH PROPOFOL N/A 03/22/2020   Procedure: COLONOSCOPY WITH PROPOFOL;  Surgeon: Rachael Fee, MD;  Location: [facility];  Service: Endoscopy;  Laterality: N/A;   CYSTOSCOPY WITH STENT PLACEMENT Right 12/07/2018   Procedure: CYSTOSCOPY WITH STENT PLACEMENT;  Surgeon: Crist Fat, MD;  Location: [facility];  Service: Urology;  Laterality: Right;   CYSTOSCOPY/URETEROSCOPY/HOLMIUM LASER/STENT PLACEMENT Right 12/31/2018   Procedure: CYSTOSCOPY RIGHT URETEROSCOPY/HOLMIUM LASER RIGHT Lucretia Roers;  Surgeon: Crist Fat, MD;  Location: [facility];  Service: Urology;  Laterality: Right;   ESOPHAGOGASTRODUODENOSCOPY (EGD) WITH PROPOFOL N/A 03/22/2020   Procedure: ESOPHAGOGASTRODUODENOSCOPY (EGD) WITH PROPOFOL;  Surgeon: Rachael Fee, MD;  Location: [facility];  Service: Endoscopy;  Laterality: N/A;   REPLACEMENT TOTAL KNEE Bilateral 5 YRS AGO   Tummy tuck       A IV Location/Drains/Wounds Patient Lines/Drains/Airways Status     Active Line/Drains/Airways     Name Placement date Placement time Site Days   Ureteral Drain/Stent Right ureter 6 Fr. 12/31/18  3220  Right ureter  1540            Intake/Output Last 24 hours No intake or output data  in the 24 hours ending 03/20/23 2300  Labs/Imaging Results for orders placed or performed during the hospital encounter of 03/16/23 (from the past 48 hour(s))  CBC with Differential     Status: Abnormal   Collection Time: 03/19/23  1:29 PM  Result Value Ref Range   WBC 6.8 4.0 - 10.5 K/uL   RBC 3.96 3.87 - 5.11 MIL/uL   Hemoglobin 11.8 (L) 12.0 - 15.0 g/dL   HCT 62.9 (L) 52.8 - 41.3 %   MCV 89.9 80.0 - 100.0 fL   MCH 29.8  26.0 - 34.0 pg   MCHC 33.1 30.0 - 36.0 g/dL   RDW 24.4 01.0 - 27.2 %   Platelets 313 150 - 400 K/uL   nRBC 0.0 0.0 - 0.2 %   Neutrophils Relative % 63 %   Neutro Abs 4.4 1.7 - 7.7 K/uL   Lymphocytes Relative 24 %   Lymphs Abs 1.6 0.7 - 4.0 K/uL   Monocytes Relative 10 %   Monocytes Absolute 0.7 0.1 - 1.0 K/uL   Eosinophils Relative 1 %   Eosinophils Absolute 0.1 0.0 - 0.5 K/uL   Basophils Relative 1 %   Basophils Absolute 0.1 0.0 - 0.1 K/uL   Immature Granulocytes 1 %   Abs Immature Granulocytes 0.04 0.00 - 0.07 K/uL    Comment: Performed at [facility], [address], [place], [place] [address]  Sedimentation rate     Status: Abnormal   Collection Time: 03/19/23  1:29 PM  Result Value Ref Range   Sed Rate 94 (H) 0 - 22 mm/hr    Comment: Performed at [facility], [address], [place], [place] [address]  C-reactive protein     Status: Abnormal   Collection Time: 03/19/23  1:29 PM  Result Value Ref Range   CRP 14.0 (H) <1.0 mg/dL    Comment: Performed at [facility], [address], [place], [place] [address]  Aerobic/Anaerobic Culture w Gram Stain (surgical/deep wound)     Status: None (Preliminary result)   Collection Time: 03/20/23 10:42 AM   Specimen: Synovium; Synovial Fluid  Result Value Ref Range   Specimen Description SYNOVIAL    Special Requests RT KNEE    Gram Stain      ABUNDANT WBC PRESENT, PREDOMINANTLY PMN NO ORGANISMS SEEN Performed at [facility], [address] [place], [place] [address]    Culture PENDING    Report Status PENDING   Synovial cell count + diff, w/ crystals     Status: Abnormal   Collection Time: 03/20/23 10:42 AM  Result Value Ref Range   Color, Synovial STRAW (A) YELLOW   Appearance-Synovial HAZY (A) CLEAR   Crystals, Fluid INTRACELLULAR CALCIUM PYROPHOSPHATE CRYSTALS    WBC, Synovial 11,150 (H) 0 - 200 /cu mm    Comment: COUNT MAY BE INACCURATE DUE TO FIBRIN CLUMPS.   Neutrophil, Synovial 92 (H) 0 - 25  %   Lymphocytes-Synovial Fld 6 0 - 20 %   Monocyte-Macrophage-Synovial Fluid 2 (L) 50 - 90 %   Eosinophils-Synovial 0 0 - 1 %    Comment: Performed at [facility], [address], [place], [place] [address]   IR US DRAIN/INJ MAJOR JOINT/BURSA  Result Date: 03/20/2023 INDICATION: Concern for knee joint infection EXAM: Ultrasound-guided aspiration right knee joint MEDICATIONS: The patient is currently admitted to the hospital and receiving intravenous antibiotics. The antibiotics were administered within an appropriate time frame prior to the initiation  of the procedure. ANESTHESIA/SEDATION: Local analgesia COMPLICATIONS: None immediate. PROCEDURE: Informed written consent was obtained from the patient after a thorough discussion of the procedural risks, benefits and alternatives. All questions were addressed. Maximal Sterile Barrier Technique was utilized including caps, mask, sterile gowns, sterile gloves, sterile drape, hand hygiene and skin antiseptic. A timeout was performed prior to the initiation of the procedure. The patient was placed supine on the exam table. Ultrasound of the right knee demonstrated small to moderate suprapatellar effusion. Skin entry site was marked, and the overlying skin was prepped draped in the standard sterile fashion. Local analgesia was obtained with 1% lidocaine. Using ultrasound guidance, a 19 gauge Yueh catheter was advanced into the identified suprapatellar effusion. Subsequently, approximately 20 mL clear yellow synovial fluid was aspirated. A sample was sent to the lab for microbiology analysis. Postprocedure imaging demonstrated appropriate reduction in effusion volume without complicating feature. A clean dressing was placed. The patient tolerated the procedure well without immediate complication. IMPRESSION: Successful ultrasound-guided aspiration of right suprapatellar knee joint effusion yielding 20 mL of clear yellow synovial fluid. Sample sent for  microbiology analysis. Electronically Signed   By: Olive Bass M.D.   On: 03/20/2023 11:39    Pending Labs Unresulted Labs (From admission, onward)     Start     Ordered   03/21/23 0500  Basic metabolic panel  Tomorrow morning,   R        03/20/23 2209            Vitals/Pain Today's Vitals   03/20/23 1945 03/20/23 1958 03/20/23 2016 03/20/23 2100  BP: (!) 149/91   135/82  Pulse: 74   76  Resp: 16   16  Temp:  99.5 F (37.5 C)    TempSrc:  Axillary    SpO2: 100%   100%  PainSc:   Asleep     Isolation Precautions No active isolations  Medications Medications  traMADol (ULTRAM) tablet 100 mg (100 mg Oral Not Given 03/16/23 1105)  ibuprofen (ADVIL) tablet 800 mg (800 mg Oral Patient Refused/Not Given 03/16/23 1603)  lidocaine (LIDODERM) 5 % 1 patch (1 patch Transdermal Patch Applied 03/20/23 1952)  vancomycin (VANCOREADY) IVPB 1250 mg/250 mL (has no administration in time range)  vancomycin (VANCOREADY) IVPB 750 mg/150 mL (has no administration in time range)  HYDROcodone-acetaminophen (NORCO/VICODIN) 5-325 MG per tablet 1 tablet (1 tablet Oral Given 03/17/23 0946)  diazepam (VALIUM) tablet 5 mg (5 mg Oral Given 03/20/23 0981)    Mobility non-ambulatory     R Recommendations: See Admitting Provider Note  Report given to:   Additional Notes: Pt is demented, oriented to self only, does not follow commands, unaware of time or place at baseline. Pt had IR which showed infection to right knee, possible need for surgery. Family is wanting pt to be placed in a SNF, they do think they can care for pt at home any longer as she is total care.

## 2023-03-20 NOTE — ED Provider Notes (Signed)
10:05 PM Was asked by nursing to reassess the patient's disposition as family was wanting to know an update.  I reviewed the patient's chart and it appears she had an IR aspiration today that per orthopedics note is consistent with prosthetic joint infection.  Her temperature appears to be rising up to 99.5.  Orthopedic note requested medicine evaluation for surgical clearance.  I spoke to hospitalist team who agrees patient needs IV antibiotics and we will repaged out to the hospitalist team for admission.  They were consulted earlier initially and are the ones that recommended septic joint workup.  Will call medicine for admission and further surgical clearance.  Orthopedics notes say they would like her n.p.o. at midnight for intervention in the morning.     Sanders Manninen, Canary Brim, MD 03/20/23 [phone]

## 2023-03-20 NOTE — ED Notes (Signed)
FALL SAFETY: Pt has yellow fall bracelet on right wrist Non-slip socks on bilateral feet Bed alarm on and active Bed in lowest position & side rails up x2 Yellow fall magnet on door frame Door to RM open, staff can observe pt from nurses station Intermittent safety rounding in place.  Pt educated on fall safety plan (pt currently disoriented, unable to understand risks for fall and safety)

## 2023-03-20 NOTE — Progress Notes (Signed)
Pharmacy Antibiotic Note  Monica Erickson is a 77 y.o. female admitted on 03/16/2023 presenting with R-Knee effusion, concern for septic joint.  Pharmacy has been consulted for vancomycin dosing.  Plan: Vancomycin 1250 mg IV x 1, then 750 mg IV q 24h (eAUC 433) Monitor renal function, Cx and ortho recs Vancomycin levels as indicated     Temp (24hrs), Avg:99.2 F (37.3 C), Min:98.8 F (37.1 C), Max:99.5 F (37.5 C)  Recent Labs  Lab 03/16/23 1105 03/16/23 2355 03/19/23 1329  WBC 9.8  --  6.8  CREATININE 1.25*  --   --   LATICACIDVEN  --  1.3  --     CrCl cannot be calculated (Unknown ideal weight.).    No Known Allergies  Daylene Posey, PharmD, [facility] Clinical Pharmacist ED Pharmacist Phone # [phone] 03/20/2023 10:08 PM

## 2023-03-20 NOTE — ED Notes (Signed)
Pt was changed out of her shirt and into a hospital gown, peri care performed and pt placed in a clean brief with a new incontinent pad placed under pt. Pt repositioned in bed and given new warm blankets.

## 2023-03-20 NOTE — ED Notes (Signed)
Pt back from IR 

## 2023-03-20 NOTE — ED Provider Notes (Signed)
Emergency Medicine Observation Re-evaluation Note  Maliha Sado is a 77 y.o. female, seen on rounds today.  Pt initially presented to the ED for complaints of Chest Pain and Multiple Complaints Currently, the patient is asleep.  Physical Exam  BP (!) 153/82 (BP Location: Right Arm)   Pulse 79   Temp 98.8 F (37.1 C) (Oral)   Resp 18   SpO2 100%  Physical Exam General: Asleep resting comfortably, no acute distress Cardiac: Regular rate Lungs: No increased work of breathing Psych: Calm, asleep  ED Course / MDM  EKG:EKG Interpretation  Date/Time:  Monday Mar 16 2023 08:58:09 EDT Ventricular Rate:  102 PR Interval:  175 QRS Duration: 89 QT Interval:  323 QTC Calculation: 421 R Axis:   72 Text Interpretation: Sinus tachycardia Probable left atrial enlargement Anterior infarct, old heart rate has increased since first prior tracing of February 04, 2023 Confirmed by Margarita Grizzle [phone]) on 03/16/2023 9:28:57 AM  I have reviewed the labs performed to date as well as medications administered while in observation.  Recent changes in the last 24 hours include plan is for IR aspiration of her right knee.  ESR and CRP were elevated on labs.  Plan  Current plan is for IR for arthrocentesis to determine admission versus stability for discharge to SNF/placement.    Rexford Maus, DO 03/20/23 [phone]

## 2023-03-20 NOTE — ED Notes (Signed)
Pt resting comfortably NAD will continue to monitor awaiting IR

## 2023-03-20 NOTE — Progress Notes (Addendum)
Patient ID: Monica Erickson, female   DOB: [birth year], 77 y.o.   MRN: [id number]   LOS: 0 days   Subjective: Demented   Objective: Vital signs in last 24 hours: Temp:  [98.8 F (37.1 C)] 98.8 F (37.1 C) (05/10 0630) Pulse Rate:  [79-83] 79 (05/10 0630) Resp:  [16-18] 18 (05/10 0630) BP: (140-153)/(82) 153/82 (05/10 0630) SpO2:  [98 %-100 %] 100 % (05/10 0630)     Laboratory  CBC Recent Labs    03/19/23 1329  WBC 6.8  HGB 11.8*  HCT 35.6*  PLT 313   BMET No results for input(s): "NA", "K", "CL", "CO2", "GLUCOSE", "BUN", "CREATININE", "CALCIUM" in the last 72 hours.   Physical Exam General appearance: alert and no distress Right knee -- Seemingly severe pain with palpation, ROM   Assessment/Plan: Right knee effusion -- Fluid characterization did not seem alarming. Will await cell count.    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 She will need explant with 2 stage revision arthroplasty. Primary team to provide preop clearance, please keep NPO from MN for possible OR. Will touch base with arthroplasty colleagues for surgical plan/timing.  Netta Cedars, MD Orthopaedic Surgery [facility]

## 2023-03-20 NOTE — ED Notes (Signed)
Per Dr. Rush Landmark, pt will need to come in for hospital admission for possible surgery d/t infection to right knee after IR procedure earlier today, will need ABX therapy.

## 2023-03-20 NOTE — ED Notes (Signed)
This RN has attempted x2 for IV access w/o success. Pt is a difficult stick, will need US guided IV

## 2023-03-20 NOTE — ED Notes (Signed)
Update given to pt's family member (brother) Roschelle Batta at [phone]. Mr Fiechtner was able to verify self and pt, he is listed as pt's alternative contact as pt is demented and confused to give verbal permission to speak with Mr. Doh. Mr. Tomasik thankful for update with correspond with pt's sister and family to visit tomorrow.

## 2023-03-21 DIAGNOSIS — M25461 Effusion, right knee: Secondary | ICD-10-CM | POA: Diagnosis not present

## 2023-03-21 DIAGNOSIS — T8453XD Infection and inflammatory reaction due to internal right knee prosthesis, subsequent encounter: Secondary | ICD-10-CM

## 2023-03-21 DIAGNOSIS — F0390 Unspecified dementia without behavioral disturbance: Secondary | ICD-10-CM | POA: Diagnosis not present

## 2023-03-21 DIAGNOSIS — G4733 Obstructive sleep apnea (adult) (pediatric): Secondary | ICD-10-CM | POA: Diagnosis not present

## 2023-03-21 LAB — CBC
HCT: 44.6 % (ref 36.0–46.0)
Hemoglobin: 14.2 g/dL (ref 12.0–15.0)
MCH: 29.2 pg (ref 26.0–34.0)
MCHC: 31.8 g/dL (ref 30.0–36.0)
MCV: 91.6 fL (ref 80.0–100.0)
Platelets: 273 10*3/uL (ref 150–400)
RBC: 4.87 MIL/uL (ref 3.87–5.11)
RDW: 14.5 % (ref 11.5–15.5)
WBC: 5.8 10*3/uL (ref 4.0–10.5)
nRBC: 0 % (ref 0.0–0.2)

## 2023-03-21 LAB — BASIC METABOLIC PANEL
Anion gap: 16 — ABNORMAL HIGH (ref 5–15)
BUN: 25 mg/dL — ABNORMAL HIGH (ref 8–23)
CO2: 20 mmol/L — ABNORMAL LOW (ref 22–32)
Calcium: 9.6 mg/dL (ref 8.9–10.3)
Chloride: 102 mmol/L (ref 98–111)
Creatinine, Ser: 0.91 mg/dL (ref 0.44–1.00)
GFR, Estimated: >60 mL/min (ref >60)
Glucose, Bld: 89 mg/dL (ref 70–99)
Potassium: 4.4 mmol/L (ref 3.5–5.1)
Sodium: 138 mmol/L (ref 135–145)

## 2023-03-21 MED ORDER — VANCOMYCIN HCL 1250 MG/250ML IV SOLN
1250.0000 mg | INTRAVENOUS | Status: DC
  Administered 2023-03-22 – 2023-03-24 (×4): 1250 mg via INTRAVENOUS
  Filled 2023-03-21 (×4): qty 250

## 2023-03-21 MED ORDER — COLCHICINE 0.6 MG PO TABS
0.6000 mg | ORAL_TABLET | Freq: Two times a day (BID) | ORAL | Status: DC
  Administered 2023-03-21 – 2023-03-22 (×2): 0.6 mg via ORAL
  Filled 2023-03-21 (×5): qty 1

## 2023-03-21 MED ORDER — METHYLPREDNISOLONE SODIUM SUCC 40 MG IJ SOLR
40.0000 mg | Freq: Every day | INTRAMUSCULAR | Status: AC
  Administered 2023-03-21 – 2023-03-25 (×5): 40 mg via INTRAVENOUS
  Filled 2023-03-21 (×5): qty 1

## 2023-03-21 MED ORDER — ACETAMINOPHEN 325 MG PO TABS
650.0000 mg | ORAL_TABLET | Freq: Four times a day (QID) | ORAL | Status: DC | PRN
  Administered 2023-03-22 – 2023-03-25 (×6): 650 mg via ORAL
  Filled 2023-03-21 (×6): qty 2

## 2023-03-21 MED ORDER — ACETAMINOPHEN 650 MG RE SUPP: 650.0000 mg | Freq: Four times a day (QID) | RECTAL | Status: DC | PRN

## 2023-03-21 MED ORDER — SODIUM CHLORIDE 0.9 % IV SOLN
2.0000 g | INTRAVENOUS | Status: DC
  Administered 2023-03-21 – 2023-03-27 (×7): 2 g via INTRAVENOUS
  Filled 2023-03-21 (×7): qty 20

## 2023-03-21 NOTE — ED Notes (Signed)
This RN attempted again to call 5N, still no answer, secure message has been sent to receiving RN w/o response.

## 2023-03-21 NOTE — Progress Notes (Signed)
Monica Erickson  MRN: [id number] DOB/Age: 06/25/1946 57 y.o. Physician: Lynnea Maizes, M.D.    Contacted by treatment team and orthopedic service line regarding need for long-term orthopedic plan.  Dr. Odis Hollingshead spoke with 3 separate arthroplasty surgeons regarding treatment plan.  Additionally he spoke with patient's healthcare power of attorney regarding various treatment options.  Updated care plan: Emergent orthopedic surgical intervention not indicated.  This potential prosthetic joint infection would require the services of one of our joint arthroplasty specialists.  Orthopedics will continue to follow patient while hospitalized.  Will follow-up on culture results.  We will facilitate evaluation by one of our local joint arthroplasty specialists    [name] [name] 03/21/2023, 2:05 PM   Contact # [phone]

## 2023-03-21 NOTE — ED Notes (Signed)
Attempted to call inpatient unit 5N again, secretary answer phone, transfer to RN Monica Erickson, however phone was disconnected, attempted additional two other times without making contact with anyone on unit.

## 2023-03-21 NOTE — ED Notes (Signed)
Attempted to call 5N to advised pt's inpatient bed has been ready >40 mins, however, no answer on unit, attempted call x2. Will call again momentary

## 2023-03-21 NOTE — ED Notes (Signed)
Spoke with charge RN on 5N unit, she advised that pt may come up at this time, transport notified.

## 2023-03-21 NOTE — Progress Notes (Signed)
Patient arrived to unit approximately 0120 in stable condition, wearing ear rings and necklace. A&Ox0, with stable vital signs. No belongings accompanied patient.

## 2023-03-21 NOTE — Progress Notes (Signed)
Pharmacy Antibiotic Note  Monica Erickson is a 78 y.o. female admitted on 03/16/2023 with  prosthetic joint infection .  Pharmacy has been consulted for vancomycin dosing. SCr today is down to 0.91, will adjust antibiotic dosing.  Plan: Increase vancomycin to 1250 mg IV every 24 hours     > estAUC 484, SCr 0.91, wt 73 kg, Vd 0.72 Monitor clinical progress, cultures/sensitivities, renal function, abx plan Vancomycin levels as indicated F/u Ortho plans and recommendations   Weight: 73 kg (160 lb 15 oz)  Temp (24hrs), Avg:98.8 F (37.1 C), Min:98.4 F (36.9 C), Max:99.5 F (37.5 C)  Recent Labs  Lab 03/16/23 1105 03/16/23 2355 03/19/23 1329 03/21/23 0218  WBC 9.8  --  6.8 5.8  CREATININE 1.25*  --   --  0.91  LATICACIDVEN  --  1.3  --   --     Estimated Creatinine Clearance: 53.9 mL/min (by C-G formula based on SCr of 0.91 mg/dL).    No Known Allergies  Antimicrobials this admission: 5/10 vancomycin >>   Dose adjustments this admission:  Microbiology results: 5/10 synovial fluid culture: ng < 24 hrs    Thank you for allowing pharmacy to be a part of this patient's care.   Signe Colt, PharmD 03/21/2023 1:31 PM  **Pharmacist phone directory can be found on [email or web address] listed under [facility]**

## 2023-03-21 NOTE — H&P (Signed)
History and Physical    Monica Erickson WUJ:[id number] DOB: 09-Nov-1946 DOA: 03/16/2023  PCP: Nona Dell, NP  Patient coming from: Home  Chief Complaint: Right knee pain  HPI: Monica Erickson is a 77 y.o. female with medical history significant of advanced dementia, chronic headaches, depression, gastritis and H. pylori ulcer, nephrolithiasis, sleep apnea not on CPAP, history of bilateral total knee replacement, PE a year ago no longer on anticoagulation.  Patient has been boarding in the ED since 5/6.  She came in for multiple complaints including right knee pain and swelling.  CT was showing moderate suprapatellar effusion.  Underwent arthrocentesis by IR yesterday and synovial fluid analysis labs consistent with prosthetic joint infection.  Orthopedics recommended n.p.o. at midnight for intervention in the morning.  No fever or leukocytosis.  No lactic acidosis.  Patient was started on vancomycin.  [facility] called to admit.  Patient has advanced dementia and is not able to give any history.  Resting comfortably and has no complaints.  Review of Systems:  Review of Systems  Reason unable to perform ROS: Advanced dementia.    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 pylori infection 03/2020   Kidney stones    Night terrors    Sleep apnea    no cpap    Past Surgical History:  Procedure Laterality Date   BIOPSY  03/22/2020   Procedure: BIOPSY;  Surgeon: Rachael Fee, MD;  Location: [facility];  Service: Endoscopy;;   Breast Lift     CATARACT EXTRACTION Bilateral 01/2020   COLONOSCOPY WITH PROPOFOL N/A 03/22/2020   Procedure: COLONOSCOPY WITH PROPOFOL;  Surgeon: Rachael Fee, MD;  Location: [facility];  Service: Endoscopy;  Laterality: N/A;   CYSTOSCOPY WITH STENT PLACEMENT Right 12/07/2018    Procedure: CYSTOSCOPY WITH STENT PLACEMENT;  Surgeon: Crist Fat, MD;  Location: [facility];  Service: Urology;  Laterality: Right;   CYSTOSCOPY/URETEROSCOPY/HOLMIUM LASER/STENT PLACEMENT Right 12/31/2018   Procedure: CYSTOSCOPY RIGHT URETEROSCOPY/HOLMIUM LASER RIGHT Lucretia Roers;  Surgeon: Crist Fat, MD;  Location: [facility];  Service: Urology;  Laterality: Right;   ESOPHAGOGASTRODUODENOSCOPY (EGD) WITH PROPOFOL N/A 03/22/2020   Procedure: ESOPHAGOGASTRODUODENOSCOPY (EGD) WITH PROPOFOL;  Surgeon: Rachael Fee, MD;  Location: [facility];  Service: Endoscopy;  Laterality: N/A;   REPLACEMENT TOTAL KNEE Bilateral 5 YRS AGO   Tummy tuck       reports that she has quit smoking. Her smoking use included cigarettes. She has never used smokeless tobacco. She reports that she does not currently use alcohol. She reports that she does not use drugs.  No Known Allergies  Family History  Problem Relation Age of Onset   Diabetes Mother    Hypertension Father    Colon cancer Neg Hx    Esophageal cancer Neg Hx    Stomach cancer Neg Hx    Rectal cancer Neg Hx     Prior to Admission medications   Medication Sig Start Date End Date Taking? Authorizing Provider  cyanocobalamin (VITAMIN B12) 1000 MCG/ML injection Inject 1,000 mcg into the muscle once a week.  08/05/24 Yes [provider]  diazepam (VALIUM) 2 MG tablet Take 2 mg by mouth 2 (two) times daily as needed for anxiety or sedation. 10/14/22  Yes [provider]  Ergocalciferol 50 MCG (2000 UT) TABS Take 2,000 Units by mouth daily at 12 noon. 02/12/23 03/12/24 Yes [provider]    Physical Exam: Vitals:   03/20/23 1958 03/20/23 2100 03/20/23 2241 03/20/23 2300  BP:  135/82  (!) 146/89  Pulse:  76  92  Resp:  16  17  Temp: 99.5 F (37.5 C)  99.1 F (37.3 C)   TempSrc: Axillary  Rectal   SpO2:  100%  99%    Physical Exam Vitals reviewed.  Constitutional:      General:  She is not in acute distress. HENT:     Head: Normocephalic and atraumatic.  Eyes:     Extraocular Movements: Extraocular movements intact.  Cardiovascular:     Rate and Rhythm: Normal rate and regular rhythm.     Pulses: Normal pulses.  Pulmonary:     Effort: Pulmonary effort is normal. No respiratory distress.     Breath sounds: Normal breath sounds. No wheezing or rales.  Abdominal:     General: Bowel sounds are normal. There is no distension.     Palpations: Abdomen is soft.     Tenderness: There is no abdominal tenderness.  Musculoskeletal:     Cervical back: Normal range of motion.     Right lower leg: No edema.     Left lower leg: No edema.  Skin:    General: Skin is warm and dry.  Neurological:     General: No focal deficit present.     Mental Status: She is alert.     Labs on Admission: I have personally reviewed following labs and imaging studies  CBC: Recent Labs  Lab 03/16/23 1105 03/19/23 1329  WBC 9.8 6.8  NEUTROABS  --  4.4  HGB 12.4 11.8*  HCT 37.8 35.6*  MCV 90.2 89.9  PLT 274 313   Basic Metabolic Panel: Recent Labs  Lab 03/16/23 1105  NA 134*  K 3.7  CL 97*  CO2 22  GLUCOSE 122*  BUN 16  CREATININE 1.25*  CALCIUM 9.1   GFR: CrCl cannot be calculated (Unknown ideal weight.). Liver Function Tests: Recent Labs  Lab 03/16/23 1105  AST 22  ALT 11  ALKPHOS 91  BILITOT 1.5*  PROT 8.1  ALBUMIN 3.3*   No results for input(s): "LIPASE", "AMYLASE" in the last 168 hours. No results for input(s): "AMMONIA" in the last 168 hours. Coagulation Profile: No results for input(s): "INR", "PROTIME" in the last 168 hours. Cardiac Enzymes: Recent Labs  Lab 03/16/23 2355  CKTOTAL 184   BNP (last 3 results) No results for input(s): "PROBNP" in the last 8760 hours. HbA1C: No results for input(s): "HGBA1C" in the last 72 hours. CBG: No results for input(s): "GLUCAP" in the last 168 hours. Lipid Profile: No results for input(s): "CHOL",  "HDL", "LDLCALC", "TRIG", "CHOLHDL", "LDLDIRECT" in the last 72 hours. Thyroid Function Tests: No results for input(s): "TSH", "T4TOTAL", "FREET4", "T3FREE", "THYROIDAB" in the last 72 hours. Anemia Panel: No results for input(s): "VITAMINB12", "FOLATE", "FERRITIN", "TIBC", "IRON", "RETICCTPCT" in the last 72 hours. Urine analysis:    Component Value Date/Time   COLORURINE AMBER (A) 03/16/2023 1345   APPEARANCEUR HAZY (A) 03/16/2023 1345   LABSPEC 1.021 03/16/2023 1345   PHURINE 5.0 03/16/2023 1345   GLUCOSEU NEGATIVE 03/16/2023 1345   HGBUR SMALL (A) 03/16/2023 1345   BILIRUBINUR NEGATIVE 03/16/2023 1345   KETONESUR 5 (A) 03/16/2023 1345   PROTEINUR 100 (A) 03/16/2023 1345   NITRITE  NEGATIVE 03/16/2023 1345   LEUKOCYTESUR TRACE (A) 03/16/2023 1345    Radiological Exams on Admission: IR US DRAIN/INJ MAJOR JOINT/BURSA  Result Date: 03/20/2023 INDICATION: Concern for knee joint infection EXAM: Ultrasound-guided aspiration right knee joint MEDICATIONS: The patient is currently admitted to the hospital and receiving intravenous antibiotics. The antibiotics were administered within an appropriate time frame prior to the initiation of the procedure. ANESTHESIA/SEDATION: Local analgesia COMPLICATIONS: None immediate. PROCEDURE: Informed written consent was obtained from the patient after a thorough discussion of the procedural risks, benefits and alternatives. All questions were addressed. Maximal Sterile Barrier Technique was utilized including caps, mask, sterile gowns, sterile gloves, sterile drape, hand hygiene and skin antiseptic. A timeout was performed prior to the initiation of the procedure. The patient was placed supine on the exam table. Ultrasound of the right knee demonstrated small to moderate suprapatellar effusion. Skin entry site was marked, and the overlying skin was prepped draped in the standard sterile fashion. Local analgesia was obtained with 1% lidocaine. Using ultrasound  guidance, a 19 gauge Yueh catheter was advanced into the identified suprapatellar effusion. Subsequently, approximately 20 mL clear yellow synovial fluid was aspirated. A sample was sent to the lab for microbiology analysis. Postprocedure imaging demonstrated appropriate reduction in effusion volume without complicating feature. A clean dressing was placed. The patient tolerated the procedure well without immediate complication. IMPRESSION: Successful ultrasound-guided aspiration of right suprapatellar knee joint effusion yielding 20 mL of clear yellow synovial fluid. Sample sent for microbiology analysis. Electronically Signed   By: Olive Bass M.D.   On: 03/20/2023 11:39    EKG: Independently reviewed.  Sinus tachycardia, no acute ischemic changes.  Assessment and Plan  Right knee prosthetic joint infection Patient came to the ED on 5/6 with right knee pain and swelling.  CT was showing moderate suprapatellar effusion. Underwent arthrocentesis by IR yesterday and synovial fluid analysis labs consistent with prosthetic joint infection.  No fever or leukocytosis.  No lactic acidosis or signs of sepsis.  Orthopedics recommended n.p.o. at midnight for intervention in the morning.  Continue antibiotic coverage with vancomycin and ceftriaxone.  Pain management.  Advanced dementia Delirium precautions.  OSA Not on CPAP.  DVT prophylaxis: SCDs Code Status: Full code by default.  Patient does not have capacity for decision-making given her advanced dementia, no surrogate or prior directive available.  Per review of chart, she was listed as full code during previous hospitalizations. Family Communication: No family available at this time. Level of care: Med-Surg Admission status: It is my clinical opinion that admission to INPATIENT is reasonable and necessary because of the expectation that this patient will require hospital care that crosses at least 2 midnights to treat this condition based on the  medical complexity of the problems presented.  Given the aforementioned information, the predictability of an adverse outcome is felt to be significant.   John Giovanni MD [facility]  If 7PM-7AM, please contact night-coverage [email or web address]  03/21/2023, 12:11 AM

## 2023-03-21 NOTE — Progress Notes (Signed)
PROGRESS NOTE    Monica Erickson  MVH:[id number] DOB: 1946-03-20 DOA: 03/16/2023 PCP: Nona Dell, NP   Chief Complaint  Patient presents with   Chest Pain   Multiple Complaints    Brief Narrative:  Patient 77 year old female history of advanced dementia, chronic headaches, depression, gastritis and H. pylori ulcer, nephrolithiasis, sleep apnea not on CPAP, history of bilateral total knee replacement, PE a year ago no longer on anticoagulation noted to have been boarding in the ED since 03/16/2023 and had presented multiple complaints including right knee pain and swelling.  CT showed a moderate suprapatellar effusion.  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  Orthopedics had recommended n.p.o. for possible intervention.   Assessment & Plan:   Principal Problem:   Knee effusion, right Active Problems:   Dementia without behavioral disturbance (HCC)   OSA (obstructive sleep apnea)   Infection of prosthetic right knee joint (HCC)  #1 right knee prosthetic joint infection -Patient noted to presented to the ED on 5/6 with right knee pain and swelling. -CT done concerning for moderate suprapatellar effusion. -Patient underwent arthrocentesis by IR and synovial fluid analysis was concerning for prosthetic joint infection.  Fluid analysis also with intracellular calcium pyrophosphate crystals. -Patient afebrile, no significant leukocytosis. -Patient placed empirically on IV vancomycin.  IV Rocephin added. -Orthopedics consulted and initially recommended n.p.o. at midnight for possible intervention. -On initial assessment per orthopedics, Dr. Odis Hollingshead discussed with patient's family as well as discussed with 3 arthroplasty surgeons and it was noted no availability for at least a week to do an explant and recommendations were made to transfer to a tertiary referral center for further evaluation versus discharging home on  antibiotics with outpatient follow-up with orthopedics in 1 week to discuss next steps. -Patient with severe dementia, unsafe disposition as concern for antibiotic compliance. -Tertiary care center contacted, Dr. Jana Half with orthopedics at [facility] reviewed the chart and transfer declined as it was felt patient's care could be managed at [facility] facility. -Orthopedics reconsulted and reengage for further evaluation and recommendations. -Will place on IV Solu-Medrol 40 mg daily x 5 days due to concern for possible pseudogout. -Also will consult with ID for antibiotic recommendations.  2. advanced dementia -Delirium precautions.  3.  OSA -Not on CPAP.   DVT prophylaxis: SCDs Code Status: Full Family Communication: Tried calling daughter however went to voicemail. Disposition: TBD  Status is: Inpatient Remains inpatient appropriate because: Severity of illness/unsafe disposition   Consultants:  Orthopedics  Procedures:  CT head without contrast 03/17/2023 CT of the right knee 03/16/2023 CT abdomen and pelvis 03/16/2023 Chest x-Lazare 03/16/2023 Plain films of the right knee 03/16/2023 Lower extremity Dopplers 03/17/2023 CT L-spine 03/16/2023 Plain films bilateral hips 03/16/2023 Ultrasound-guided aspiration right knee joint per IR: 03/19/2023 per Dr. Elijio Miles El-Abd  Antimicrobials:  Anti-infectives (From admission, onward)    Start     Dose/Rate Route Frequency Ordered Stop   03/21/23 2330  vancomycin (VANCOREADY) IVPB 1250 mg/250 mL        1,250 mg 166.7 mL/hr over 90 Minutes Intravenous Every 24 hours 03/21/23 1327     03/21/23 2300  vancomycin (VANCOREADY) IVPB 750 mg/150 mL  Status:  Discontinued        750 mg 150 mL/hr over 60 Minutes Intravenous Every 24 hours 03/20/23 2209 03/21/23 1327   03/21/23 0100  cefTRIAXone (ROCEPHIN) 2 g in sodium chloride 0.9 % 100 mL IVPB  2 g 200 mL/hr over 30 Minutes Intravenous Every 24 hours 03/21/23 0057     03/20/23 2215  vancomycin  (VANCOREADY) IVPB 1250 mg/250 mL        1,250 mg 166.7 mL/hr over 90 Minutes Intravenous  Once 03/20/23 2209 03/21/23 0123         Subjective: Sleeping but arousable.  Pleasantly confused.  Objective: Vitals:   03/21/23 0045 03/21/23 0105 03/21/23 0137 03/21/23 0804  BP:  136/74  (!) 158/80  Pulse: 80   76  Resp: 17 17    Temp:  98.4 F (36.9 C)  98.5 F (36.9 C)  TempSrc:    Oral  SpO2: 98%   100%  Weight:   73 kg     Intake/Output Summary (Last 24 hours) at 03/21/2023 1152 Last data filed at 03/21/2023 0700 Gross per 24 hour  Intake 100 ml  Output --  Net 100 ml   Filed Weights   03/21/23 0137  Weight: 73 kg    Examination:  General exam: Appears calm and comfortable  Respiratory system: Clear to auscultation anterior lung fields. Respiratory effort normal. Cardiovascular system: S1 & S2 heard, RRR. No JVD, murmurs, rubs, gallops or clicks. No pedal edema. Gastrointestinal system: Abdomen is nondistended, soft and nontender. No organomegaly or masses felt. Normal bowel sounds heard. Central nervous system: Alert.  Moving extremities spontaneously.  No focal neurological deficits. Extremities: Right knee with some warmth, some tenderness to palpation, no significant erythema.  Symmetric 5 x 5 power. Skin: No rashes, lesions or ulcers Psychiatry: Judgement and insight appear poor. Mood & affect appropriate.     Data Reviewed: I have personally reviewed following labs and imaging studies  CBC: Recent Labs  Lab 03/16/23 1105 03/19/23 1329 03/21/23 0218  WBC 9.8 6.8 5.8  NEUTROABS  --  4.4  --   HGB 12.4 11.8* 14.2  HCT 37.8 35.6* 44.6  MCV 90.2 89.9 91.6  PLT 274 313 273    Basic Metabolic Panel: Recent Labs  Lab 03/16/23 1105 03/21/23 0218  NA 134* 138  K 3.7 4.4  CL 97* 102  CO2 22 20*  GLUCOSE 122* 89  BUN 16 25*  CREATININE 1.25* 0.91  CALCIUM 9.1 9.6    GFR: Estimated Creatinine Clearance: 53.9 mL/min (by C-G formula based on SCr of  0.91 mg/dL).  Liver Function Tests: Recent Labs  Lab 03/16/23 1105  AST 22  ALT 11  ALKPHOS 91  BILITOT 1.5*  PROT 8.1  ALBUMIN 3.3*    CBG: No results for input(s): "GLUCAP" in the last 168 hours.   Recent Results (from the past 240 hour(s))  Aerobic/Anaerobic Culture w Gram Stain (surgical/deep wound)     Status: None (Preliminary result)   Collection Time: 03/20/23 10:42 AM   Specimen: Synovium; Synovial Fluid  Result Value Ref Range Status   Specimen Description SYNOVIAL  Final   Special Requests RT KNEE  Final   Gram Stain   Final    ABUNDANT WBC PRESENT, PREDOMINANTLY PMN NO ORGANISMS SEEN    Culture   Final    NO GROWTH < 24 HOURS Performed at [facility], [address] [place], [place] [address]    Report Status PENDING  Incomplete         Radiology Studies: IR US DRAIN/INJ MAJOR JOINT/BURSA  Result Date: 03/20/2023 INDICATION: Concern for knee joint infection EXAM: Ultrasound-guided aspiration right knee joint MEDICATIONS: The patient is currently admitted to the hospital and receiving intravenous antibiotics.  The antibiotics were administered within an appropriate time frame prior to the initiation of the procedure. ANESTHESIA/SEDATION: Local analgesia COMPLICATIONS: None immediate. PROCEDURE: Informed written consent was obtained from the patient after a thorough discussion of the procedural risks, benefits and alternatives. All questions were addressed. Maximal Sterile Barrier Technique was utilized including caps, mask, sterile gowns, sterile gloves, sterile drape, hand hygiene and skin antiseptic. A timeout was performed prior to the initiation of the procedure. The patient was placed supine on the exam table. Ultrasound of the right knee demonstrated small to moderate suprapatellar effusion. Skin entry site was marked, and the overlying skin was prepped draped in the standard sterile fashion. Local analgesia was obtained with 1% lidocaine. Using  ultrasound guidance, a 19 gauge Yueh catheter was advanced into the identified suprapatellar effusion. Subsequently, approximately 20 mL clear yellow synovial fluid was aspirated. A sample was sent to the lab for microbiology analysis. Postprocedure imaging demonstrated appropriate reduction in effusion volume without complicating feature. A clean dressing was placed. The patient tolerated the procedure well without immediate complication. IMPRESSION: Successful ultrasound-guided aspiration of right suprapatellar knee joint effusion yielding 20 mL of clear yellow synovial fluid. Sample sent for microbiology analysis. Electronically Signed   By: Olive Bass M.D.   On: 03/20/2023 11:39        Scheduled Meds:  lidocaine  1 patch Transdermal Q24H   Continuous Infusions:  cefTRIAXone (ROCEPHIN)  IV 2 g (03/21/23 0130)   vancomycin       LOS: 1 day    Time spent: 45 minutes      Ramiro Harvest, MD [facility]   To contact the attending provider between 7A-7P or the covering provider during after hours 7P-7A, please log into the web site [email or web address] and access using universal [place] password for that web site. If you do not have the password, please call the hospital operator.  03/21/2023, 11:52 AM

## 2023-03-21 NOTE — Care Plan (Addendum)
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 Patient lives with daughter but has severe dementia so daughter reports it is very difficult to achieve any antibiotic compliance -reviewed clinical case with primary team and three arthroplasty surgeons but no availability for at least a week to do her explant -she remains stable on RNF but high risk for non compliance with discharge & follow up -notified primary team that we advise transfer to tertiary referral center at this point -if primary team/ID feel she is safe for discharge her on antibiotics, she may follow up this week as outpatient with Drs. Marchwiany/Olin/Swinteck to discuss next steps -Ortho will sign off at this point   Netta Cedars, MD Orthopaedic Surgery [facility]

## 2023-03-22 DIAGNOSIS — G4733 Obstructive sleep apnea (adult) (pediatric): Secondary | ICD-10-CM | POA: Diagnosis not present

## 2023-03-22 DIAGNOSIS — F0390 Unspecified dementia without behavioral disturbance: Secondary | ICD-10-CM | POA: Diagnosis not present

## 2023-03-22 DIAGNOSIS — T8453XD Infection and inflammatory reaction due to internal right knee prosthesis, subsequent encounter: Secondary | ICD-10-CM | POA: Diagnosis not present

## 2023-03-22 DIAGNOSIS — M11261 Other chondrocalcinosis, right knee: Secondary | ICD-10-CM

## 2023-03-22 DIAGNOSIS — M25461 Effusion, right knee: Secondary | ICD-10-CM | POA: Diagnosis not present

## 2023-03-22 LAB — CBC WITH DIFFERENTIAL/PLATELET
Abs Immature Granulocytes: 0.04 10*3/uL (ref 0.00–0.07)
Basophils Absolute: 0 10*3/uL (ref 0.0–0.1)
Basophils Relative: 0 %
Eosinophils Absolute: 0 10*3/uL (ref 0.0–0.5)
Eosinophils Relative: 0 %
HCT: 37.7 % (ref 36.0–46.0)
Hemoglobin: 12.1 g/dL (ref 12.0–15.0)
Immature Granulocytes: 1 %
Lymphocytes Relative: 13 %
Lymphs Abs: 1 10*3/uL (ref 0.7–4.0)
MCH: 28.8 pg (ref 26.0–34.0)
MCHC: 32.1 g/dL (ref 30.0–36.0)
MCV: 89.8 fL (ref 80.0–100.0)
Monocytes Absolute: 0.2 10*3/uL (ref 0.1–1.0)
Monocytes Relative: 2 %
Neutro Abs: 6.1 10*3/uL (ref 1.7–7.7)
Neutrophils Relative %: 84 %
Platelets: 243 10*3/uL (ref 150–400)
RBC: 4.2 MIL/uL (ref 3.87–5.11)
RDW: 14.3 % (ref 11.5–15.5)
WBC: 7.4 10*3/uL (ref 4.0–10.5)
nRBC: 0 % (ref 0.0–0.2)

## 2023-03-22 LAB — BASIC METABOLIC PANEL
Anion gap: 13 (ref 5–15)
BUN: 27 mg/dL — ABNORMAL HIGH (ref 8–23)
CO2: 19 mmol/L — ABNORMAL LOW (ref 22–32)
Calcium: 9.3 mg/dL (ref 8.9–10.3)
Chloride: 103 mmol/L (ref 98–111)
Creatinine, Ser: 0.82 mg/dL (ref 0.44–1.00)
GFR, Estimated: >60 mL/min (ref >60)
Glucose, Bld: 155 mg/dL — ABNORMAL HIGH (ref 70–99)
Potassium: 5.1 mmol/L (ref 3.5–5.1)
Sodium: 135 mmol/L (ref 135–145)

## 2023-03-22 LAB — AEROBIC/ANAEROBIC CULTURE W GRAM STAIN (SURGICAL/DEEP WOUND)

## 2023-03-22 MED ORDER — ENOXAPARIN SODIUM 30 MG/0.3ML IJ SOSY
30.0000 mg | PREFILLED_SYRINGE | INTRAMUSCULAR | Status: DC
  Administered 2023-03-22: 30 mg via SUBCUTANEOUS
  Filled 2023-03-22: qty 0.3

## 2023-03-22 MED ORDER — HYDRALAZINE HCL 20 MG/ML IJ SOLN
5.0000 mg | Freq: Four times a day (QID) | INTRAMUSCULAR | Status: DC | PRN
  Administered 2023-03-22: 5 mg via INTRAVENOUS
  Filled 2023-03-22: qty 1

## 2023-03-22 MED ORDER — SENNOSIDES-DOCUSATE SODIUM 8.6-50 MG PO TABS
1.0000 | ORAL_TABLET | Freq: Two times a day (BID) | ORAL | Status: DC
  Administered 2023-03-22 – 2023-03-27 (×10): 1 via ORAL
  Filled 2023-03-22 (×11): qty 1

## 2023-03-22 MED ORDER — BISACODYL 10 MG RE SUPP
10.0000 mg | Freq: Once | RECTAL | Status: AC
  Administered 2023-03-22: 10 mg via RECTAL
  Filled 2023-03-22: qty 1

## 2023-03-22 NOTE — Consult Note (Signed)
Reason for Consult: infected right total knee replacement Referring Physician: Janee Morn, MD (Hospitalist), Odis Hollingshead, MD  Monica Erickson is an 77 y.o. female.  HPI: Patient 77 year old female history of advanced dementia, chronic headaches, depression, gastritis and H. pylori ulcer, nephrolithiasis, sleep apnea not on CPAP, history of bilateral total knee replacement, PE a year ago no longer on anticoagulation noted to have been boarding in the ED since 03/16/2023 and had presented multiple complaints including right knee pain and swelling. CT showed a moderate suprapatellar effusion. 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   Orthopedics was consulted for evaluation and recommendations for treatment.  One of my foot and ankle specialist partners, Dr. Odis Hollingshead, was on call at the time of the initial evaluation.  This was on Friday the 10th.  Initially I had commented that this was not an urgent and that the patient could follow-up with me in the office to schedule definitive treatment.  However I was on call on 03/22/2023 and have elected to try to get her knee treated during this hospitalization.  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 pylori infection 03/2020   Kidney stones    Night terrors    Sleep apnea    no cpap    Past Surgical History:  Procedure Laterality Date   BIOPSY  03/22/2020   Procedure: BIOPSY;  Surgeon: Rachael Fee, MD;  Location: [facility];  Service: Endoscopy;;   Breast Lift     CATARACT EXTRACTION Bilateral 01/2020   COLONOSCOPY WITH PROPOFOL N/A 03/22/2020   Procedure: COLONOSCOPY WITH PROPOFOL;  Surgeon: Rachael Fee, MD;  Location: [facility];  Service: Endoscopy;  Laterality: N/A;   CYSTOSCOPY WITH  STENT PLACEMENT Right 12/07/2018   Procedure: CYSTOSCOPY WITH STENT PLACEMENT;  Surgeon: Crist Fat, MD;  Location: [facility];  Service: Urology;  Laterality: Right;   CYSTOSCOPY/URETEROSCOPY/HOLMIUM LASER/STENT PLACEMENT Right 12/31/2018   Procedure: CYSTOSCOPY RIGHT URETEROSCOPY/HOLMIUM LASER RIGHT Lucretia Roers;  Surgeon: Crist Fat, MD;  Location: [facility];  Service: Urology;  Laterality: Right;   ESOPHAGOGASTRODUODENOSCOPY (EGD) WITH PROPOFOL N/A 03/22/2020   Procedure: ESOPHAGOGASTRODUODENOSCOPY (EGD) WITH PROPOFOL;  Surgeon: Rachael Fee, MD;  Location: [facility];  Service: Endoscopy;  Laterality: N/A;   REPLACEMENT TOTAL KNEE Bilateral 5 YRS AGO   Tummy tuck      Family History  Problem Relation Age of Onset   Diabetes Mother    Hypertension Father    Colon cancer Neg Hx    Esophageal cancer Neg Hx    Stomach cancer Neg Hx    Rectal cancer Neg Hx     Social History:  reports that she has quit smoking. Her smoking use included cigarettes. She has never used smokeless tobacco. She reports that she does not currently use alcohol. She reports that she does not use drugs.  Allergies: No Known Allergies  Medications: I have reviewed the patient's current medications. Scheduled:  enoxaparin (LOVENOX) injection  30 mg Subcutaneous Q24H   lidocaine  1 patch Transdermal Q24H   methylPREDNISolone (SOLU-MEDROL) injection  40 mg Intravenous Daily   senna-docusate  1 tablet Oral BID    Results for orders placed or performed during the hospital encounter of  03/16/23 (from the past 24 hour(s))  CBC with Differential/Platelet     Status: None   Collection Time: 03/22/23  2:50 AM  Result Value Ref Range   WBC 7.4 4.0 - 10.5 K/uL   RBC 4.20 3.87 - 5.11 MIL/uL   Hemoglobin 12.1 12.0 - 15.0 g/dL   HCT 40.9 81.1 - 91.4 %   MCV 89.8 80.0 - 100.0 fL   MCH 28.8 26.0 - 34.0 pg   MCHC 32.1 30.0 - 36.0 g/dL   RDW 78.2 95.6 - 21.3 %   Platelets 243 150  - 400 K/uL   nRBC 0.0 0.0 - 0.2 %   Neutrophils Relative % 84 %   Neutro Abs 6.1 1.7 - 7.7 K/uL   Lymphocytes Relative 13 %   Lymphs Abs 1.0 0.7 - 4.0 K/uL   Monocytes Relative 2 %   Monocytes Absolute 0.2 0.1 - 1.0 K/uL   Eosinophils Relative 0 %   Eosinophils Absolute 0.0 0.0 - 0.5 K/uL   Basophils Relative 0 %   Basophils Absolute 0.0 0.0 - 0.1 K/uL   Immature Granulocytes 1 %   Abs Immature Granulocytes 0.04 0.00 - 0.07 K/uL  Basic metabolic panel     Status: Abnormal   Collection Time: 03/22/23  2:50 AM  Result Value Ref Range   Sodium 135 135 - 145 mmol/L   Potassium 5.1 3.5 - 5.1 mmol/L   Chloride 103 98 - 111 mmol/L   CO2 19 (L) 22 - 32 mmol/L   Glucose, Bld 155 (H) 70 - 99 mg/dL   BUN 27 (H) 8 - 23 mg/dL   Creatinine, Ser 0.86 0.44 - 1.00 mg/dL   Calcium 9.3 8.9 - 57.8 mg/dL   GFR, Estimated >46 >96 mL/min   Anion gap 13 5 - 15     X-Fodor: CLINICAL DATA:  Knee pain, decreased mobility   EXAM: RIGHT KNEE - COMPLETE 4+ VIEW   COMPARISON:  None Available.   FINDINGS: Osseous demineralization.   Components of a RIGHT knee prosthesis identified.   Moderate joint effusion with inferior patellar spurring noted.   No acute fracture, dislocation, or bone destruction.   IMPRESSION: RIGHT knee prosthesis with moderate joint effusion.   No acute osseous abnormalities.  ROS: As per the HPI  Blood pressure (!) 123/91, pulse 100, temperature 98.2 F (36.8 C), temperature source Oral, resp. rate 19, weight 73 kg, SpO2 100 %.  Physical Exam: Vitals reviewed.  Constitutional:      General: She is not in acute distress. HENT:     Head: Normocephalic and atraumatic.  Eyes:     Extraocular Movements: Extraocular movements intact.  Cardiovascular:     Rate and Rhythm: Normal rate and regular rhythm.     Pulses: Normal pulses.  Pulmonary:     Effort: Pulmonary effort is normal. No respiratory distress.     Breath sounds: Normal breath sounds. No wheezing or  rales.  Abdominal:     General: Bowel sounds are normal. There is no distension.     Palpations: Abdomen is soft.     Tenderness: There is no abdominal tenderness.  Musculoskeletal:     Examination of her right knee performed at the time of her initial assessment and indicated concern for warmth and palpable effusion.  She had a painful range of motion. Skin:    General: Skin is warm and dry.  Neurological:     General: No focal deficit present.     Mental Status: She is  alert.   Aspiration results:  Latest Reference Range & Units 03/20/23 10:42  Color, Synovial YELLOW  STRAW !  Appearance-Synovial CLEAR  HAZY !  Crystals, Fluid  INTRACELLULAR CALCIUM PYROPHOSPHATE CRYSTALS  WBC, Synovial 0 - 200 /cu mm 11,150 (H)  Neutrophil, Synovial 0 - 25 % 92 (H)  Lymphocytes-Synovial Fld 0 - 20 % 6  Monocyte-Macrophage-Synovial Fluid 50 - 90 % 2 (L)  Eosinophils-Synovial 0 - 1 % 0  !: Data is abnormal (H): Data is abnormally high (L): Data is abnormally low  Assessment/Plan: 1.  Infection versus noninfectious etiology to right knee following total knee arthroplasty by report 16 years ago in [place]  Plan: Upon review of the condition of her right knee and the onset of her pain based on the discussion I had with her daughter about her activity level and the recent decline I feel that it would be in the patient's best interest to proceed with an open excisional and nonexcisional debridement of the right knee with polyethylene exchange.  We discussed that this type of treatment has less morbidity and if this in fact is an acute onset of a change in her symptoms and quality life that we may have a higher likelihood of curing any infection.  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  I am uncertain of the clinical significance of this however surely an I&D of the knee would help to diminish any inflammatory reaction this may have. We have identified that she had a  [employer] knee replacement.  We have contact the representative to have all possibilities of sizes for polyethylene exchange. I have spoken with her family to discuss with them my thoughts about treatment and the plan to proceed with an open debridement and polyethylene exchange on Monday, 03/23/2023.  We discussed that postoperatively she will need to have a PICC line placed for 6 weeks of IV antibiotics and from there perhaps suppressive antibiotics for a 3 to 21-month period of time. Orders will be placed for her to be n.p.o. after 4:30 AM per the [facility] protocol.  Monica Erickson 03/22/2023, 4:57 PM

## 2023-03-22 NOTE — Plan of Care (Signed)

## 2023-03-22 NOTE — H&P (View-Only) (Signed)
Reason for Consult: infected right total knee replacement Referring Physician: Thompson, MD (Hospitalist), Ramanathan, MD  Monica Erickson is an 76 y.o. female.  HPI: Patient 76-year-old female history of advanced dementia, chronic headaches, depression, gastritis and H. pylori ulcer, nephrolithiasis, sleep apnea not on CPAP, history of bilateral total knee replacement, PE a year ago no longer on anticoagulation noted to have been boarding in the ED since 03/16/2023 and had presented multiple complaints including right knee pain and swelling. CT showed a moderate suprapatellar effusion. 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   Orthopedics was consulted for evaluation and recommendations for treatment.  One of my foot and ankle specialist partners, Dr. Ramanathan, was on call at the time of the initial evaluation.  This was on Friday the 10th.  Initially I had commented that this was not an urgent and that the patient could follow-up with me in the office to schedule definitive treatment.  However I was on call on 03/22/2023 and have elected to try to get her knee treated during this hospitalization.  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 pylori infection 03/2020   Kidney stones    Night terrors    Sleep apnea    no cpap    Past Surgical History:  Procedure Laterality Date   BIOPSY  03/22/2020   Procedure: BIOPSY;  Surgeon: Jacobs, Daniel P, MD;  Location: [facility];  Service: Endoscopy;;   Breast Lift     CATARACT EXTRACTION Bilateral 01/2020   COLONOSCOPY WITH PROPOFOL N/A 03/22/2020   Procedure: COLONOSCOPY WITH PROPOFOL;  Surgeon: Jacobs, Daniel P, MD;  Location: [facility];  Service: Endoscopy;  Laterality: N/A;   CYSTOSCOPY WITH  STENT PLACEMENT Right 12/07/2018   Procedure: CYSTOSCOPY WITH STENT PLACEMENT;  Surgeon: Herrick, Benjamin W, MD;  Location: [facility];  Service: Urology;  Laterality: Right;   CYSTOSCOPY/URETEROSCOPY/HOLMIUM LASER/STENT PLACEMENT Right 12/31/2018   Procedure: CYSTOSCOPY RIGHT URETEROSCOPY/HOLMIUM LASER RIGHT STENT EXGHANGE;  Surgeon: Herrick, Benjamin W, MD;  Location: [facility];  Service: Urology;  Laterality: Right;   ESOPHAGOGASTRODUODENOSCOPY (EGD) WITH PROPOFOL N/A 03/22/2020   Procedure: ESOPHAGOGASTRODUODENOSCOPY (EGD) WITH PROPOFOL;  Surgeon: Jacobs, Daniel P, MD;  Location: [facility];  Service: Endoscopy;  Laterality: N/A;   REPLACEMENT TOTAL KNEE Bilateral 5 YRS AGO   Tummy tuck      Family History  Problem Relation Age of Onset   Diabetes Mother    Hypertension Father    Colon cancer Neg Hx    Esophageal cancer Neg Hx    Stomach cancer Neg Hx    Rectal cancer Neg Hx     Social History:  reports that she has quit smoking. Her smoking use included cigarettes. She has never used smokeless tobacco. She reports that she does not currently use alcohol. She reports that she does not use drugs.  Allergies: No Known Allergies  Medications: I have reviewed the patient's current medications. Scheduled:  enoxaparin (LOVENOX) injection  30 mg Subcutaneous Q24H   lidocaine  1 patch Transdermal Q24H   methylPREDNISolone (SOLU-MEDROL) injection  40 mg Intravenous Daily   senna-docusate  1 tablet Oral BID    Results for orders placed or performed during the hospital encounter of   03/16/23 (from the past 24 hour(s))  CBC with Differential/Platelet     Status: None   Collection Time: 03/22/23  2:50 AM  Result Value Ref Range   WBC 7.4 4.0 - 10.5 K/uL   RBC 4.20 3.87 - 5.11 MIL/uL   Hemoglobin 12.1 12.0 - 15.0 g/dL   HCT 37.7 36.0 - 46.0 %   MCV 89.8 80.0 - 100.0 fL   MCH 28.8 26.0 - 34.0 pg   MCHC 32.1 30.0 - 36.0 g/dL   RDW 14.3 11.5 - 15.5 %   Platelets 243 150  - 400 K/uL   nRBC 0.0 0.0 - 0.2 %   Neutrophils Relative % 84 %   Neutro Abs 6.1 1.7 - 7.7 K/uL   Lymphocytes Relative 13 %   Lymphs Abs 1.0 0.7 - 4.0 K/uL   Monocytes Relative 2 %   Monocytes Absolute 0.2 0.1 - 1.0 K/uL   Eosinophils Relative 0 %   Eosinophils Absolute 0.0 0.0 - 0.5 K/uL   Basophils Relative 0 %   Basophils Absolute 0.0 0.0 - 0.1 K/uL   Immature Granulocytes 1 %   Abs Immature Granulocytes 0.04 0.00 - 0.07 K/uL  Basic metabolic panel     Status: Abnormal   Collection Time: 03/22/23  2:50 AM  Result Value Ref Range   Sodium 135 135 - 145 mmol/L   Potassium 5.1 3.5 - 5.1 mmol/L   Chloride 103 98 - 111 mmol/L   CO2 19 (L) 22 - 32 mmol/L   Glucose, Bld 155 (H) 70 - 99 mg/dL   BUN 27 (H) 8 - 23 mg/dL   Creatinine, Ser 0.82 0.44 - 1.00 mg/dL   Calcium 9.3 8.9 - 10.3 mg/dL   GFR, Estimated >60 >60 mL/min   Anion gap 13 5 - 15     X-Gammel: CLINICAL DATA:  Knee pain, decreased mobility   EXAM: RIGHT KNEE - COMPLETE 4+ VIEW   COMPARISON:  None Available.   FINDINGS: Osseous demineralization.   Components of a RIGHT knee prosthesis identified.   Moderate joint effusion with inferior patellar spurring noted.   No acute fracture, dislocation, or bone destruction.   IMPRESSION: RIGHT knee prosthesis with moderate joint effusion.   No acute osseous abnormalities.  ROS: As per the HPI  Blood pressure (!) 123/91, pulse 100, temperature 98.2 F (36.8 C), temperature source Oral, resp. rate 19, weight 73 kg, SpO2 100 %.  Physical Exam: Vitals reviewed.  Constitutional:      General: She is not in acute distress. HENT:     Head: Normocephalic and atraumatic.  Eyes:     Extraocular Movements: Extraocular movements intact.  Cardiovascular:     Rate and Rhythm: Normal rate and regular rhythm.     Pulses: Normal pulses.  Pulmonary:     Effort: Pulmonary effort is normal. No respiratory distress.     Breath sounds: Normal breath sounds. No wheezing or  rales.  Abdominal:     General: Bowel sounds are normal. There is no distension.     Palpations: Abdomen is soft.     Tenderness: There is no abdominal tenderness.  Musculoskeletal:     Examination of her right knee performed at the time of her initial assessment and indicated concern for warmth and palpable effusion.  She had a painful range of motion. Skin:    General: Skin is warm and dry.  Neurological:     General: No focal deficit present.     Mental Status: She is   alert.   Aspiration results:  Latest Reference Range & Units 03/20/23 10:42  Color, Synovial YELLOW  STRAW !  Appearance-Synovial CLEAR  HAZY !  Crystals, Fluid  INTRACELLULAR CALCIUM PYROPHOSPHATE CRYSTALS  WBC, Synovial 0 - 200 /cu mm 11,150 (H)  Neutrophil, Synovial 0 - 25 % 92 (H)  Lymphocytes-Synovial Fld 0 - 20 % 6  Monocyte-Macrophage-Synovial Fluid 50 - 90 % 2 (L)  Eosinophils-Synovial 0 - 1 % 0  !: Data is abnormal (H): Data is abnormally high (L): Data is abnormally low  Assessment/Plan: 1.  Infection versus noninfectious etiology to right knee following total knee arthroplasty by report 16 years ago in [place]  Plan: Upon review of the condition of her right knee and the onset of her pain based on the discussion I had with her daughter about her activity level and the recent decline I feel that it would be in the patient's best interest to proceed with an open excisional and nonexcisional debridement of the right knee with polyethylene exchange.  We discussed that this type of treatment has less morbidity and if this in fact is an acute onset of a change in her symptoms and quality life that we may have a higher likelihood of curing any infection.  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  I am uncertain of the clinical significance of this however surely an I&D of the knee would help to diminish any inflammatory reaction this may have. We have identified that she had a  [place]ica Ericks[place] knee replacement.  We have contact the representative to have all possibilities of sizes for polyethylene exchange. I have spoken with her family to discuss with them my thoughts about treatment and the plan to proceed with an open debridement and polyethylene exchange on Monday, 03/23/2023.  We discussed that postoperatively she will need to have a PICC line placed for 6 weeks of IV antibiotics and from there perhaps suppressive antibiotics for a 3 to 6-month period of time. Orders will be placed for her to be n.p.o. after 4:30 AM per the [facility] protocol.  Monica Erickson 03/22/2023, 4:57 PM      

## 2023-03-22 NOTE — Progress Notes (Addendum)
PROGRESS NOTE    Monica Erickson  UJW:[id number] DOB: 06/25/1946 DOA: 03/16/2023 PCP: Nona Dell, NP   Chief Complaint  Patient presents with   Chest Pain   Multiple Complaints    Brief Narrative:  Patient 77 year old female history of advanced dementia, chronic headaches, depression, gastritis and H. pylori ulcer, nephrolithiasis, sleep apnea not on CPAP, history of bilateral total knee replacement, PE a year ago no longer on anticoagulation noted to have been boarding in the ED since 03/16/2023 and had presented multiple complaints including right knee pain and swelling.  CT showed a moderate suprapatellar effusion.  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  Orthopedics had recommended n.p.o. for possible intervention.   Assessment & Plan:   Principal Problem:   Knee effusion, right Active Problems:   Dementia without behavioral disturbance (HCC)   OSA (obstructive sleep apnea)   Infection of prosthetic right knee joint (HCC)  #1 right knee prosthetic joint infection/?? pseudogout -Patient noted to presented to the ED on 5/6 with right knee pain and swelling. -CT done concerning for moderate suprapatellar effusion. -Patient underwent arthrocentesis by IR and synovial fluid analysis was concerning for prosthetic joint infection.  Fluid analysis also with intracellular calcium pyrophosphate crystals. -Patient afebrile, no significant leukocytosis. -Patient placed empirically on IV vancomycin.  IV Rocephin added. -Orthopedics consulted and initially recommended n.p.o. at midnight for possible intervention. -On initial assessment per orthopedics, Dr. Odis Hollingshead discussed with patient's family as well as discussed with 3 arthroplasty surgeons and it was noted no availability for at least a week to do an explant and recommendations were made to transfer to a tertiary referral center for further evaluation versus  discharging home on antibiotics with outpatient follow-up with orthopedics in 1 week to discuss next steps. -Patient with severe dementia, unsafe disposition as concern for antibiotic compliance. -Tertiary care center contacted, Dr. Jana Half with orthopedics at [facility] reviewed the chart and transfer declined as it was felt patient's care could be managed at [facility] facility. -Orthopedics reconsulted and reengage for further evaluation and recommendations. -Continue IV Solu-Medrol 40 mg daily for 5 days due to concerns for possible pseudogout. -Per orthopedics.  2. advanced dementia -Continue delirium precautions.   3.  OSA -Not on CPAP.   DVT prophylaxis: SCDs Code Status: Full Family Communication: No family at bedside.   Disposition: TBD  Status is: Inpatient Remains inpatient appropriate because: Severity of illness/unsafe disposition   Consultants:  Orthopedics: Dr. Odis Hollingshead 03/19/2023 Orthopedics: Dr. Rennis Chris 03/21/2023  Procedures:  CT head without contrast 03/17/2023 CT of the right knee 03/16/2023 CT abdomen and pelvis 03/16/2023 Chest x-Tomaso 03/16/2023 Plain films of the right knee 03/16/2023 Lower extremity Dopplers 03/17/2023 CT L-spine 03/16/2023 Plain films bilateral hips 03/16/2023 Ultrasound-guided aspiration right knee joint per IR: 03/19/2023 per Dr. Elijio Miles El-Abd  Antimicrobials:  Anti-infectives (From admission, onward)    Start     Dose/Rate Route Frequency Ordered Stop   03/21/23 2330  vancomycin (VANCOREADY) IVPB 1250 mg/250 mL        1,250 mg 166.7 mL/hr over 90 Minutes Intravenous Every 24 hours 03/21/23 1327     03/21/23 2300  vancomycin (VANCOREADY) IVPB 750 mg/150 mL  Status:  Discontinued        750 mg 150 mL/hr over 60 Minutes Intravenous Every 24 hours 03/20/23 2209 03/21/23 1327   03/21/23 0100  cefTRIAXone (ROCEPHIN) 2 g in sodium chloride 0.9 % 100 mL IVPB  2 g 200 mL/hr over 30 Minutes Intravenous Every 24 hours 03/21/23 0057     03/20/23  2215  vancomycin (VANCOREADY) IVPB 1250 mg/250 mL        1,250 mg 166.7 mL/hr over 90 Minutes Intravenous  Once 03/20/23 2209 03/21/23 0123         Subjective: Awake, alert.  Pleasantly confused.  Denies any chest pain or shortness of breath.  States right knee pain improved.    Objective: Vitals:   03/22/23 0400 03/22/23 0729 03/22/23 1200 03/22/23 1439  BP: 136/69 (!) 165/95 (!) 149/76 (!) 123/91  Pulse: (!) 55 84 92 100  Resp: 17  19   Temp:  97.7 F (36.5 C)  98.2 F (36.8 C)  TempSrc:  Oral  Oral  SpO2: 98% 100% 100% 100%  Weight:        Intake/Output Summary (Last 24 hours) at 03/22/2023 1527 Last data filed at 03/22/2023 0900 Gross per 24 hour  Intake 351.3 ml  Output 425 ml  Net -73.7 ml   Filed Weights   03/21/23 0137  Weight: 73 kg    Examination:  General exam: NAD Respiratory system: CTAB anterior lung fields.  No wheezes, no crackles, no rhonchi.  Fair air movement.  Speaking in full sentences.  Cardiovascular system: Regular rate rhythm no murmurs rubs or gallops.  No JVD.  No lower extremity edema.   Gastrointestinal system: Abdomen is soft, nondistended, some tenderness to palpation diffusely/abdominal discomfort.  Positive bowel sounds.  No rebound.  No guarding.  Central nervous system: Alert.  Moving extremities spontaneously.  No focal neurological deficits. Extremities: Right knee with decreased warmth, decreased tenderness to palpation.  No significant erythema. Skin: No rashes, lesions or ulcers Psychiatry: Judgement and insight appear poor. Mood & affect appropriate.     Data Reviewed: I have personally reviewed following labs and imaging studies  CBC: Recent Labs  Lab 03/16/23 1105 03/19/23 1329 03/21/23 0218 03/22/23 0250  WBC 9.8 6.8 5.8 7.4  NEUTROABS  --  4.4  --  6.1  HGB 12.4 11.8* 14.2 12.1  HCT 37.8 35.6* 44.6 37.7  MCV 90.2 89.9 91.6 89.8  PLT 274 313 273 243    Basic Metabolic Panel: Recent Labs  Lab 03/16/23 1105  03/21/23 0218 03/22/23 0250  NA 134* 138 135  K 3.7 4.4 5.1  CL 97* 102 103  CO2 22 20* 19*  GLUCOSE 122* 89 155*  BUN 16 25* 27*  CREATININE 1.25* 0.91 0.82  CALCIUM 9.1 9.6 9.3    GFR: Estimated Creatinine Clearance: 59.8 mL/min (by C-G formula based on SCr of 0.82 mg/dL).  Liver Function Tests: Recent Labs  Lab 03/16/23 1105  AST 22  ALT 11  ALKPHOS 91  BILITOT 1.5*  PROT 8.1  ALBUMIN 3.3*    CBG: No results for input(s): "GLUCAP" in the last 168 hours.   Recent Results (from the past 240 hour(s))  Aerobic/Anaerobic Culture w Gram Stain (surgical/deep wound)     Status: None (Preliminary result)   Collection Time: 03/20/23 10:42 AM   Specimen: Synovium; Synovial Fluid  Result Value Ref Range Status   Specimen Description SYNOVIAL  Final   Special Requests RT KNEE  Final   Gram Stain   Final    ABUNDANT WBC PRESENT, PREDOMINANTLY PMN NO ORGANISMS SEEN    Culture   Final    NO GROWTH 2 DAYS NO ANAEROBES ISOLATED; CULTURE IN PROGRESS FOR 5 DAYS Performed at [facility], [address] [place], [place] [address]    Report Status PENDING  Incomplete         Radiology Studies: No results found.      Scheduled Meds:  bisacodyl  10 mg Rectal Once   enoxaparin (LOVENOX) injection  30 mg Subcutaneous Q24H   lidocaine  1 patch Transdermal Q24H   methylPREDNISolone (SOLU-MEDROL) injection  40 mg Intravenous Daily   senna-docusate  1 tablet Oral BID   Continuous Infusions:  cefTRIAXone (ROCEPHIN)  IV 2 g (03/22/23 0019)   vancomycin 1,250 mg (03/22/23 0028)     LOS: 2 days    Time spent: 35 minutes      Ramiro Harvest, MD [facility]   To contact the attending provider between 7A-7P or the covering provider during after hours 7P-7A, please log into the web site [email or web address] and access using universal [place] password for that web site. If you do not have the password, please call the hospital operator.  03/22/2023, 3:27  PM

## 2023-03-23 ENCOUNTER — Other Ambulatory Visit: Payer: Self-pay

## 2023-03-23 ENCOUNTER — Inpatient Hospital Stay (HOSPITAL_COMMUNITY): Payer: Medicare Other | Admitting: Certified Registered Nurse Anesthetist

## 2023-03-23 ENCOUNTER — Encounter (HOSPITAL_COMMUNITY)
Admission: EM | Disposition: A | Discharge status: Skilled Nursing Facility | Payer: Self-pay | Source: Home / Self Care | Attending: Internal Medicine

## 2023-03-23 DIAGNOSIS — T8453XD Infection and inflammatory reaction due to internal right knee prosthesis, subsequent encounter: Secondary | ICD-10-CM | POA: Diagnosis not present

## 2023-03-23 DIAGNOSIS — D649 Anemia, unspecified: Secondary | ICD-10-CM

## 2023-03-23 DIAGNOSIS — Z87891 Personal history of nicotine dependence: Secondary | ICD-10-CM

## 2023-03-23 DIAGNOSIS — G4730 Sleep apnea, unspecified: Secondary | ICD-10-CM

## 2023-03-23 DIAGNOSIS — T8453XA Infection and inflammatory reaction due to internal right knee prosthesis, initial encounter: Secondary | ICD-10-CM

## 2023-03-23 DIAGNOSIS — G4733 Obstructive sleep apnea (adult) (pediatric): Secondary | ICD-10-CM | POA: Diagnosis not present

## 2023-03-23 DIAGNOSIS — F0390 Unspecified dementia without behavioral disturbance: Secondary | ICD-10-CM | POA: Diagnosis not present

## 2023-03-23 DIAGNOSIS — M25461 Effusion, right knee: Secondary | ICD-10-CM | POA: Diagnosis not present

## 2023-03-23 HISTORY — PX: I & D KNEE WITH POLY EXCHANGE: SHX5024

## 2023-03-23 LAB — CBC
HCT: 37.6 % (ref 36.0–46.0)
Hemoglobin: 11.7 g/dL — ABNORMAL LOW (ref 12.0–15.0)
MCH: 29 pg (ref 26.0–34.0)
MCHC: 31.1 g/dL (ref 30.0–36.0)
MCV: 93.3 fL (ref 80.0–100.0)
Platelets: 347 10*3/uL (ref 150–400)
RBC: 4.03 MIL/uL (ref 3.87–5.11)
RDW: 14.3 % (ref 11.5–15.5)
WBC: 10.2 10*3/uL (ref 4.0–10.5)
nRBC: 0 % (ref 0.0–0.2)

## 2023-03-23 LAB — BASIC METABOLIC PANEL
Anion gap: 10 (ref 5–15)
BUN: 25 mg/dL — ABNORMAL HIGH (ref 8–23)
CO2: 22 mmol/L (ref 22–32)
Calcium: 9.1 mg/dL (ref 8.9–10.3)
Chloride: 106 mmol/L (ref 98–111)
Creatinine, Ser: 0.89 mg/dL (ref 0.44–1.00)
GFR, Estimated: >60 mL/min (ref >60)
Glucose, Bld: 114 mg/dL — ABNORMAL HIGH (ref 70–99)
Potassium: 4 mmol/L (ref 3.5–5.1)
Sodium: 138 mmol/L (ref 135–145)

## 2023-03-23 LAB — SURGICAL PCR SCREEN
MRSA, PCR: NEGATIVE
Staphylococcus aureus: POSITIVE — AB

## 2023-03-23 SURGERY — IRRIGATION AND DEBRIDEMENT KNEE WITH POLY EXCHANGE
Anesthesia: General | Site: Knee | Laterality: Right

## 2023-03-23 MED ORDER — SODIUM CHLORIDE 0.9 % IV SOLN: INTRAVENOUS | Status: DC

## 2023-03-23 MED ORDER — CHLORHEXIDINE GLUCONATE 0.12 % MT SOLN
OROMUCOSAL | Status: AC
  Administered 2023-03-23: 15 mL via OROMUCOSAL
  Filled 2023-03-23: qty 15

## 2023-03-23 MED ORDER — MUPIROCIN 2 % EX OINT
1.0000 | TOPICAL_OINTMENT | Freq: Two times a day (BID) | CUTANEOUS | Status: DC
  Administered 2023-03-23 – 2023-03-27 (×9): 1 via NASAL
  Filled 2023-03-23 (×2): qty 22

## 2023-03-23 MED ORDER — ROCURONIUM BROMIDE 10 MG/ML (PF) SYRINGE
PREFILLED_SYRINGE | INTRAVENOUS | Status: AC
  Filled 2023-03-23: qty 10

## 2023-03-23 MED ORDER — ORAL CARE MOUTH RINSE: 15.0000 mL | Freq: Once | OROMUCOSAL | Status: AC

## 2023-03-23 MED ORDER — ACETAMINOPHEN 10 MG/ML IV SOLN: 1000.0000 mg | Freq: Once | INTRAVENOUS | Status: DC | PRN

## 2023-03-23 MED ORDER — TRANEXAMIC ACID-NACL 1000-0.7 MG/100ML-% IV SOLN
1000.0000 mg | INTRAVENOUS | Status: AC
  Administered 2023-03-23: 1000 mg via INTRAVENOUS

## 2023-03-23 MED ORDER — CHLORHEXIDINE GLUCONATE CLOTH 2 % EX PADS
6.0000 | MEDICATED_PAD | Freq: Every day | CUTANEOUS | Status: AC
  Administered 2023-03-23 – 2023-03-27 (×5): 6 via TOPICAL

## 2023-03-23 MED ORDER — 0.9 % SODIUM CHLORIDE (POUR BTL) OPTIME
TOPICAL | Status: DC | PRN
  Administered 2023-03-23: 1000 mL

## 2023-03-23 MED ORDER — TRANEXAMIC ACID-NACL 1000-0.7 MG/100ML-% IV SOLN
INTRAVENOUS | Status: AC
  Filled 2023-03-23: qty 100

## 2023-03-23 MED ORDER — FENTANYL CITRATE (PF) 250 MCG/5ML IJ SOLN
INTRAMUSCULAR | Status: DC | PRN
  Administered 2023-03-23: 50 ug via INTRAVENOUS
  Administered 2023-03-23: 25 ug via INTRAVENOUS
  Administered 2023-03-23 (×2): 50 ug via INTRAVENOUS

## 2023-03-23 MED ORDER — PHENYLEPHRINE 80 MCG/ML (10ML) SYRINGE FOR IV PUSH (FOR BLOOD PRESSURE SUPPORT)
PREFILLED_SYRINGE | INTRAVENOUS | Status: AC
  Filled 2023-03-23: qty 10

## 2023-03-23 MED ORDER — ROCURONIUM BROMIDE 10 MG/ML (PF) SYRINGE
PREFILLED_SYRINGE | INTRAVENOUS | Status: DC | PRN
  Administered 2023-03-23: 20 mg via INTRAVENOUS
  Administered 2023-03-23: 50 mg via INTRAVENOUS

## 2023-03-23 MED ORDER — POVIDONE-IODINE 10 % EX SWAB: 2.0000 | Freq: Once | CUTANEOUS | Status: DC

## 2023-03-23 MED ORDER — DEXMEDETOMIDINE HCL IN NACL 80 MCG/20ML IV SOLN
INTRAVENOUS | Status: AC
  Filled 2023-03-23: qty 20

## 2023-03-23 MED ORDER — CHLORHEXIDINE GLUCONATE 4 % EX SOLN: 60.0000 mL | Freq: Once | CUTANEOUS | Status: DC

## 2023-03-23 MED ORDER — PROPOFOL 10 MG/ML IV BOLUS
INTRAVENOUS | Status: DC | PRN
  Administered 2023-03-23: 150 mg via INTRAVENOUS

## 2023-03-23 MED ORDER — DEXAMETHASONE SODIUM PHOSPHATE 10 MG/ML IJ SOLN
INTRAMUSCULAR | Status: DC | PRN
  Administered 2023-03-23: 5 mg via INTRAVENOUS

## 2023-03-23 MED ORDER — DEXAMETHASONE SODIUM PHOSPHATE 10 MG/ML IJ SOLN
INTRAMUSCULAR | Status: AC
  Filled 2023-03-23: qty 1

## 2023-03-23 MED ORDER — CEFAZOLIN SODIUM-DEXTROSE 2-4 GM/100ML-% IV SOLN
INTRAVENOUS | Status: AC
  Filled 2023-03-23: qty 100

## 2023-03-23 MED ORDER — SUGAMMADEX SODIUM 200 MG/2ML IV SOLN
INTRAVENOUS | Status: DC | PRN
  Administered 2023-03-23: 200 mg via INTRAVENOUS

## 2023-03-23 MED ORDER — CEFAZOLIN SODIUM-DEXTROSE 2-4 GM/100ML-% IV SOLN: 2.0000 g | INTRAVENOUS | Status: DC

## 2023-03-23 MED ORDER — PHENYLEPHRINE 80 MCG/ML (10ML) SYRINGE FOR IV PUSH (FOR BLOOD PRESSURE SUPPORT)
PREFILLED_SYRINGE | INTRAVENOUS | Status: DC | PRN
  Administered 2023-03-23 (×2): 80 ug via INTRAVENOUS
  Administered 2023-03-23: 160 ug via INTRAVENOUS

## 2023-03-23 MED ORDER — ACETAMINOPHEN 10 MG/ML IV SOLN
INTRAVENOUS | Status: DC | PRN
  Administered 2023-03-23: 1000 mg via INTRAVENOUS

## 2023-03-23 MED ORDER — LIDOCAINE 2% (20 MG/ML) 5 ML SYRINGE
INTRAMUSCULAR | Status: DC | PRN
  Administered 2023-03-23: 60 mg via INTRAVENOUS

## 2023-03-23 MED ORDER — LACTATED RINGERS IV SOLN: INTRAVENOUS | Status: DC

## 2023-03-23 MED ORDER — FENTANYL CITRATE (PF) 100 MCG/2ML IJ SOLN: 25.0000 ug | INTRAMUSCULAR | Status: DC | PRN

## 2023-03-23 MED ORDER — SODIUM CHLORIDE 0.9 % IR SOLN
Status: DC | PRN
  Administered 2023-03-23: 6000 mL

## 2023-03-23 MED ORDER — CHLORHEXIDINE GLUCONATE 0.12 % MT SOLN: 15.0000 mL | Freq: Once | OROMUCOSAL | Status: AC

## 2023-03-23 MED ORDER — FENTANYL CITRATE (PF) 250 MCG/5ML IJ SOLN
INTRAMUSCULAR | Status: AC
  Filled 2023-03-23: qty 5

## 2023-03-23 MED ORDER — ONDANSETRON HCL 4 MG/2ML IJ SOLN
INTRAMUSCULAR | Status: DC | PRN
  Administered 2023-03-23: 4 mg via INTRAVENOUS

## 2023-03-23 MED ORDER — LIDOCAINE 2% (20 MG/ML) 5 ML SYRINGE
INTRAMUSCULAR | Status: AC
  Filled 2023-03-23: qty 5

## 2023-03-23 MED ORDER — ACETAMINOPHEN 10 MG/ML IV SOLN
INTRAVENOUS | Status: AC
  Filled 2023-03-23: qty 100

## 2023-03-23 MED ORDER — CEFAZOLIN SODIUM-DEXTROSE 2-4 GM/100ML-% IV SOLN
2.0000 g | INTRAVENOUS | Status: AC
  Administered 2023-03-23: 2 g via INTRAVENOUS

## 2023-03-23 MED ORDER — ONDANSETRON HCL 4 MG/2ML IJ SOLN
INTRAMUSCULAR | Status: AC
  Filled 2023-03-23: qty 2

## 2023-03-23 SURGICAL SUPPLY — 74 items
ADH SKN CLS LQ APL DERMABOND (GAUZE/BANDAGES/DRESSINGS) ×2
BAG COUNTER SPONGE SURGICOUNT (BAG) ×1 IMPLANT
BAG SPNG CNTER NS LX DISP (BAG) ×1
BANDAGE ESMARK 6X9 LF (GAUZE/BANDAGES/DRESSINGS) ×1 IMPLANT
BNDG CMPR 9X6 STRL LF SNTH (GAUZE/BANDAGES/DRESSINGS) ×1
BNDG CMPR MED 15X6 ELC VLCR LF (GAUZE/BANDAGES/DRESSINGS) ×1
BNDG ELASTIC 6X15 VLCR STRL LF (GAUZE/BANDAGES/DRESSINGS) IMPLANT
BNDG ELASTIC 6X5.8 VLCR STR LF (GAUZE/BANDAGES/DRESSINGS) ×1 IMPLANT
BNDG ESMARK 6X9 LF (GAUZE/BANDAGES/DRESSINGS) ×1
BOWL SMART MIX CTS (DISPOSABLE) ×1 IMPLANT
COVER SURGICAL LIGHT HANDLE (MISCELLANEOUS) ×1 IMPLANT
CUFF TOURN SGL QUICK 34 (TOURNIQUET CUFF)
CUFF TOURN SGL QUICK 42 (TOURNIQUET CUFF) IMPLANT
CUFF TRNQT CYL 34X4.125X (TOURNIQUET CUFF) IMPLANT
DERMABOND ADVANCED .7 DNX6 (GAUZE/BANDAGES/DRESSINGS) IMPLANT
DRAPE HALF SHEET 40X57 (DRAPES) ×1 IMPLANT
DRAPE IMP U-DRAPE 54X76 (DRAPES) ×1 IMPLANT
DRAPE POUCH INSTRU U-SHP 10X18 (DRAPES) ×1 IMPLANT
DRAPE U-SHAPE 47X51 STRL (DRAPES) ×1 IMPLANT
DRSG ADAPTIC 3X8 NADH LF (GAUZE/BANDAGES/DRESSINGS) ×1 IMPLANT
DRSG AQUACEL AG ADV 3.5X10 (GAUZE/BANDAGES/DRESSINGS) IMPLANT
ELECT REM PT RETURN 9FT ADLT (ELECTROSURGICAL) ×1
ELECTRODE REM PT RTRN 9FT ADLT (ELECTROSURGICAL) ×1 IMPLANT
FACESHIELD STD STERILE (MASK) ×2 IMPLANT
FACESHIELD WRAPAROUND (MASK) ×1 IMPLANT
FACESHIELD WRAPAROUND OR TEAM (MASK) ×1 IMPLANT
GAUZE PAD ABD 8X10 STRL (GAUZE/BANDAGES/DRESSINGS) ×2 IMPLANT
GAUZE SPONGE 4X4 12PLY STRL (GAUZE/BANDAGES/DRESSINGS) ×1 IMPLANT
GLOVE BIOGEL PI IND STRL 7.5 (GLOVE) ×3 IMPLANT
GLOVE BIOGEL PI IND STRL 8 (GLOVE) ×1 IMPLANT
GLOVE ORTHO TXT STRL SZ7.5 (GLOVE) ×2 IMPLANT
GLOVE SURG ENC MOIS LTX SZ6 (GLOVE) ×1 IMPLANT
GLOVE SURG ORTHO 8.0 STRL STRW (GLOVE) ×1 IMPLANT
GOWN STRL REUS W/ TWL LRG LVL3 (GOWN DISPOSABLE) ×2 IMPLANT
GOWN STRL REUS W/TWL LRG LVL3 (GOWN DISPOSABLE) ×2
HANDPIECE INTERPULSE COAX TIP (DISPOSABLE) ×1
IMMOBILIZER KNEE 20 (SOFTGOODS) IMPLANT
IMMOBILIZER KNEE 20 THIGH 36 (SOFTGOODS) IMPLANT
IMMOBILIZER KNEE 22 UNIV (SOFTGOODS) ×1 IMPLANT
IMMOBILIZER KNEE 24 THIGH 36 (MISCELLANEOUS) IMPLANT
IMMOBILIZER KNEE 24 UNIV (MISCELLANEOUS) IMPLANT
INSERT TIB HI FLEX SZ 5-6 10 (Insert) IMPLANT
KIT BASIN OR (CUSTOM PROCEDURE TRAY) ×1 IMPLANT
KIT TURNOVER KIT B (KITS) ×1 IMPLANT
MANIFOLD NEPTUNE II (INSTRUMENTS) ×1 IMPLANT
MARKER SPHERE PSV REFLC THRD 5 (MARKER) ×3 IMPLANT
NDL 18GX1X1/2 (RX/OR ONLY) (NEEDLE) ×2 IMPLANT
NEEDLE 18GX1X1/2 (RX/OR ONLY) (NEEDLE) ×2 IMPLANT
NS IRRIG 1000ML POUR BTL (IV SOLUTION) ×1 IMPLANT
PACK TOTAL JOINT (CUSTOM PROCEDURE TRAY) ×1 IMPLANT
PACK UNIVERSAL I (CUSTOM PROCEDURE TRAY) ×1 IMPLANT
PAD ARMBOARD 7.5X6 YLW CONV (MISCELLANEOUS) ×2 IMPLANT
PADDING CAST COTTON 6X4 STRL (CAST SUPPLIES) ×2 IMPLANT
PIN SCHANZ 4MM 130MM (PIN) IMPLANT
SET HNDPC FAN SPRY TIP SCT (DISPOSABLE) ×1 IMPLANT
SPONGE SURGIFOAM ABS GEL 100C (HEMOSTASIS) IMPLANT
SPONGE T-LAP 18X18 ~~LOC~~+RFID (SPONGE) IMPLANT
STRIP CLOSURE SKIN 1/2X4 (GAUZE/BANDAGES/DRESSINGS) ×2 IMPLANT
SUCTION FRAZIER HANDLE 10FR (MISCELLANEOUS) ×1
SUCTION TUBE FRAZIER 10FR DISP (MISCELLANEOUS) ×1 IMPLANT
SUT MNCRL AB 3-0 PS2 18 (SUTURE) IMPLANT
SUT MNCRL AB 4-0 PS2 18 (SUTURE) ×1 IMPLANT
SUT STRATAFIX 1PDS 45CM VIOLET (SUTURE) IMPLANT
SUT VIC AB 1 CT1 27 (SUTURE) ×2
SUT VIC AB 1 CT1 27XBRD ANBCTR (SUTURE) ×3 IMPLANT
SUT VIC AB 2-0 CT1 27 (SUTURE) ×2
SUT VIC AB 2-0 CT1 TAPERPNT 27 (SUTURE) ×3 IMPLANT
SYR 3ML LL SCALE MARK (SYRINGE) ×1 IMPLANT
SYR 50ML SLIP (SYRINGE) ×1 IMPLANT
TOWEL GREEN STERILE (TOWEL DISPOSABLE) ×1 IMPLANT
TOWEL GREEN STERILE FF (TOWEL DISPOSABLE) ×1 IMPLANT
TRAY FOLEY MTR SLVR 16FR STAT (SET/KITS/TRAYS/PACK) ×1 IMPLANT
WATER STERILE IRR 1000ML POUR (IV SOLUTION) ×1 IMPLANT
WRAP KNEE MAXI GEL POST OP (GAUZE/BANDAGES/DRESSINGS) ×1 IMPLANT

## 2023-03-23 NOTE — Progress Notes (Signed)
Patient ID: Monica Erickson, female   DOB: [birth year], 77 y.o.   MRN: [id number]  No events overnight  Right knee with some warmth but no obvious recurrent effusion  NPO for OR tonight - I&D with polyethylene exchnage

## 2023-03-23 NOTE — Discharge Instructions (Signed)

## 2023-03-23 NOTE — Brief Op Note (Signed)
03/16/2023 - 03/23/2023  6:14 PM  PATIENT:  Monica Erickson  78 y.o. female  PRE-OPERATIVE DIAGNOSIS:  infected right total knee replacement  POST-OPERATIVE DIAGNOSIS:  infected right total knee replacement  PROCEDURE:  Procedure(s): IRRIGATION AND DEBRIDEMENT KNEE WITH POLY EXCHANGE (Right)  SURGEON:  Surgeon(s) and Role:    Durene Romans, MD - Primary  PHYSICIAN ASSISTANT: Rosalene Billings, PA-C  ANESTHESIA:   general  EBL:  <200cc  BLOOD ADMINISTERED:none  DRAINS: none   LOCAL MEDICATIONS USED:  NONE  SPECIMEN:  Source of Specimen:  right knee synovial fluid and right knee tissue  DISPOSITION OF SPECIMEN:  PATHOLOGY  COUNTS:  YES  TOURNIQUET:  26 min at 225 mmHg  DICTATION: .Other Dictation: Dictation Number [id number]  PLAN OF CARE: Admit to inpatient   PATIENT DISPOSITION:  PACU - hemodynamically stable.   Delay start of Pharmacological VTE agent (>24hrs) due to surgical blood loss or risk of bleeding: no

## 2023-03-23 NOTE — Anesthesia Postprocedure Evaluation (Signed)
Anesthesia Post Note  Patient: Elly Salido  Procedure(s) Performed: IRRIGATION AND DEBRIDEMENT KNEE WITH POLY EXCHANGE (Right: Knee)     Patient location during evaluation: PACU Anesthesia Type: General Level of consciousness: awake and alert Pain management: pain level controlled Vital Signs Assessment: post-procedure vital signs reviewed and stable Respiratory status: spontaneous breathing, nonlabored ventilation and respiratory function stable Cardiovascular status: blood pressure returned to baseline and stable Postop Assessment: no apparent nausea or vomiting Anesthetic complications: no  No notable events documented.  Last Vitals:  Vitals:   03/23/23 2125 03/23/23 2128  BP: (!) 163/109 (!) 158/74  Pulse: 93 90  Resp: 20   Temp: 36.6 C   SpO2: 100%     Last Pain:  Vitals:   03/23/23 2125  TempSrc: Oral  PainSc:                  Moneisha Vosler,W. EDMOND

## 2023-03-23 NOTE — Anesthesia Preprocedure Evaluation (Addendum)
Anesthesia Evaluation  Patient identified by MRN, date of birth, ID band Patient awake    Reviewed: Allergy & Precautions, NPO status , Patient's Chart, lab work & pertinent test results  History of Anesthesia Complications Negative for: history of anesthetic complications  Airway Mallampati: II  TM Distance: >3 FB Neck ROM: Full    Dental  (+) Edentulous Upper, Edentulous Lower   Pulmonary sleep apnea , former smoker   breath sounds clear to auscultation       Cardiovascular (-) hypertension(-) angina  Rhythm:Regular Rate:Normal     Neuro/Psych  Headaches   Depression   Dementia    GI/Hepatic Neg liver ROS, PUD,,,  Endo/Other    Renal/GU H/o stones  negative genitourinary   Musculoskeletal   Abdominal   Peds  Hematology  (+) Blood dyscrasia, anemia Lab Results      Component                Value               Date                      WBC                      10.2                03/23/2023                HGB                      11.7 (L)            03/23/2023                HCT                      37.6                03/23/2023                MCV                      93.3                03/23/2023                PLT                      347                 03/23/2023             Lab Results      Component                Value               Date                      NA                       138                 03/23/2023                K  4.0                 03/23/2023                CO2                      22                  03/23/2023                GLUCOSE                  114 (H)             03/23/2023                BUN                      25 (H)              03/23/2023                CREATININE               0.89                03/23/2023                CALCIUM                  9.1                 03/23/2023                GFRNONAA                 >60                 03/23/2023               Anesthesia Other Findings   Reproductive/Obstetrics                             Anesthesia Physical Anesthesia Plan  ASA: 3  Anesthesia Plan: General   Post-op Pain Management: Tylenol PO (pre-op)*   Induction: Intravenous  PONV Risk Score and Plan: 2 and Ondansetron, Dexamethasone and Treatment may vary due to age or medical condition  Airway Management Planned: Oral ETT  Additional Equipment: None  Intra-op Plan:   Post-operative Plan: Extubation in OR  Informed Consent: I have reviewed the patients History and Physical, chart, labs and discussed the procedure including the risks, benefits and alternatives for the proposed anesthesia with the patient or authorized representative who has indicated his/her understanding and acceptance.     Consent reviewed with POA and History available from chart only  Plan Discussed with: CRNA and Surgeon  Anesthesia Plan Comments: (Pt with cognitive difficulties, very uncooperative. Plan routine monitors, GETA)       Anesthesia Quick Evaluation

## 2023-03-23 NOTE — Progress Notes (Signed)
SLP Cancellation Note  Patient Details Name: Monica Erickson MRN: [id number] DOB: 1946-10-18   Cancelled treatment:       Reason Eval/Treat Not Completed: Patient at procedure or test/unavailable. Pt NPO for procedure tonight. Will f/u for assessment of swallowing post procedure.    Keliah Harned, Riley Nearing 03/23/2023, 10:25 AM

## 2023-03-23 NOTE — Progress Notes (Signed)
Pt arrived to short stay for surgery. SS staff has attempted to call every person on pt's contact list for consent including daughter [name], Breonica, Gauna Gist, and Willy Eddy. Unable to reach anyone at this time.

## 2023-03-23 NOTE — Interval H&P Note (Signed)
History and Physical Interval Note:  03/23/2023 6:13 PM  Monica Erickson  has presented today for surgery, with the diagnosis of Right knee infection.  The various methods of treatment have been discussed with the patient and family. After consideration of risks, benefits and other options for treatment, the patient has consented to  Procedure(s): IRRIGATION AND DEBRIDEMENT KNEE WITH POLY EXCHANGE (Right) as a surgical intervention.  The patient's history has been reviewed, patient examined, no change in status, stable for surgery.  I have reviewed the patient's chart and labs.  Questions were answered to the patient's satisfaction.     [name]

## 2023-03-23 NOTE — Progress Notes (Addendum)
PROGRESS NOTE    Monica Erickson  ZOX:[id number] DOB: Oct 14, 1946 DOA: 03/16/2023 PCP: Nona Dell, NP   Chief Complaint  Patient presents with   Chest Pain   Multiple Complaints    Brief Narrative:  Patient 77 year old female history of advanced dementia, chronic headaches, depression, gastritis and H. pylori ulcer, nephrolithiasis, sleep apnea not on CPAP, history of bilateral total knee replacement, PE a year ago no longer on anticoagulation noted to have been boarding in the ED since 03/16/2023 and had presented multiple complaints including right knee pain and swelling.  CT showed a moderate suprapatellar effusion.  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  Orthopedics had recommended n.p.o. for possible intervention.   Assessment & Plan:   Principal Problem:   Knee effusion, right Active Problems:   Dementia without behavioral disturbance (HCC)   OSA (obstructive sleep apnea)   Infection of prosthetic right knee joint (HCC)  #1 right knee prosthetic joint infection/?? pseudogout -Patient noted to presented to the ED on 5/6 with right knee pain and swelling. -CT done concerning for moderate suprapatellar effusion. -Patient underwent arthrocentesis by IR and synovial fluid analysis was concerning for prosthetic joint infection.  Fluid analysis also with intracellular calcium pyrophosphate crystals. -Patient afebrile, no significant leukocytosis. -Patient placed empirically on IV vancomycin.  IV Rocephin added. -Orthopedics consulted and initially recommended n.p.o. at midnight for possible intervention. -On initial assessment per orthopedics, Dr. Odis Hollingshead discussed with patient's family as well as discussed with 3 arthroplasty surgeons and it was noted no availability for at least a week to do an explant and recommendations were made to transfer to a tertiary referral center for further evaluation versus  discharging home on antibiotics with outpatient follow-up with orthopedics in 1 week to discuss next steps. -Patient with severe dementia, unsafe disposition as concern for antibiotic compliance. -Tertiary care center contacted, Dr. Jana Half with orthopedics at [facility] reviewed the chart and transfer declined as it was felt patient's care could be managed at [facility] facility. -Orthopedics reconsulted and reengage for further evaluation and recommendations. -Patient seen by Dr. Charlann Boxer of orthopedics who recommended I&D and polyethylene exchange to be done today. -Continue IV Solu-Medrol 40 mg daily for 5 days due to concerns for possible pseudogout. -Continue empiric IV antibiotics. -Postoperatively may need ID input if further antibiotics needed. -Per orthopedics.  2. advanced dementia -Delirium precautions.   3.  OSA -Not on CPAP.   DVT prophylaxis: SCDs Code Status: Full Family Communication: updated daughter [name] on telephone.  Disposition: TBD  Status is: Inpatient Remains inpatient appropriate because: Severity of illness/unsafe disposition   Consultants:  Orthopedics: Dr. Odis Hollingshead 03/19/2023 Orthopedics: Dr. Rennis Chris 03/21/2023  Procedures:  CT head without contrast 03/17/2023 CT of the right knee 03/16/2023 CT abdomen and pelvis 03/16/2023 Chest x-Stclair 03/16/2023 Plain films of the right knee 03/16/2023 Lower extremity Dopplers 03/17/2023 CT L-spine 03/16/2023 Plain films bilateral hips 03/16/2023 Ultrasound-guided aspiration right knee joint per IR: 03/19/2023 per Dr. Elijio Miles El-Abd  Antimicrobials:  Anti-infectives (From admission, onward)    Start     Dose/Rate Route Frequency Ordered Stop   03/21/23 2330  vancomycin (VANCOREADY) IVPB 1250 mg/250 mL        1,250 mg 166.7 mL/hr over 90 Minutes Intravenous Every 24 hours 03/21/23 1327     03/21/23 2300  vancomycin (VANCOREADY) IVPB 750 mg/150 mL  Status:  Discontinued        750 mg 150 mL/hr over 60 Minutes Intravenous Every  24  hours 03/20/23 2209 03/21/23 1327   03/21/23 0100  cefTRIAXone (ROCEPHIN) 2 g in sodium chloride 0.9 % 100 mL IVPB        2 g 200 mL/hr over 30 Minutes Intravenous Every 24 hours 03/21/23 0057     03/20/23 2215  vancomycin (VANCOREADY) IVPB 1250 mg/250 mL        1,250 mg 166.7 mL/hr over 90 Minutes Intravenous  Once 03/20/23 2209 03/21/23 0123         Subjective: Sleeping but easily arousable.  Pleasantly confused.  Denies any chest pain or shortness of breath.  Some right knee pain.     Objective: Vitals:   03/22/23 1439 03/22/23 2122 03/23/23 0429 03/23/23 0717  BP: (!) 123/91 (!) 144/83 123/89 139/88  Pulse: 100 81 64 84  Resp:  19 19   Temp: 98.2 F (36.8 C) 98.1 F (36.7 C) 98 F (36.7 C) 97.8 F (36.6 C)  TempSrc: Oral  Oral Oral  SpO2: 100% 100% 98% 100%  Weight:        Intake/Output Summary (Last 24 hours) at 03/23/2023 1248 Last data filed at 03/23/2023 0500 Gross per 24 hour  Intake --  Output 250 ml  Net -250 ml    Filed Weights   03/21/23 0137  Weight: 73 kg    Examination:  General exam: NAD Respiratory system: CTAB anterior lung fields.  No wheezes, no crackles, no rhonchi.  Fair air movement.   Cardiovascular system: RRR no murmurs rubs or gallops.  No JVD.  No lower extremity edema.   Gastrointestinal system: Abdomen is soft, nondistended, nontender to palpation.  Positive bowel sounds.  No rebound.  No guarding.  Central nervous system: Alert.  Moving extremities spontaneously.  No focal neurological deficits. Extremities: Right knee with decreased warmth, decreased tenderness to palpation.  No significant erythema. Skin: No rashes, lesions or ulcers Psychiatry: Judgement and insight appear poor. Mood & affect appropriate.     Data Reviewed: I have personally reviewed following labs and imaging studies  CBC: Recent Labs  Lab 03/19/23 1329 03/21/23 0218 03/22/23 0250 03/23/23 0317  WBC 6.8 5.8 7.4 10.2  NEUTROABS 4.4  --  6.1  --   HGB  11.8* 14.2 12.1 11.7*  HCT 35.6* 44.6 37.7 37.6  MCV 89.9 91.6 89.8 93.3  PLT 313 273 243 347     Basic Metabolic Panel: Recent Labs  Lab 03/21/23 0218 03/22/23 0250 03/23/23 0317  NA 138 135 138  K 4.4 5.1 4.0  CL 102 103 106  CO2 20* 19* 22  GLUCOSE 89 155* 114*  BUN 25* 27* 25*  CREATININE 0.91 0.82 0.89  CALCIUM 9.6 9.3 9.1     GFR: Estimated Creatinine Clearance: 55.1 mL/min (by C-G formula based on SCr of 0.89 mg/dL).  Liver Function Tests: No results for input(s): "AST", "ALT", "ALKPHOS", "BILITOT", "PROT", "ALBUMIN" in the last 168 hours.   CBG: No results for input(s): "GLUCAP" in the last 168 hours.   Recent Results (from the past 240 hour(s))  Aerobic/Anaerobic Culture w Gram Stain (surgical/deep wound)     Status: None (Preliminary result)   Collection Time: 03/20/23 10:42 AM   Specimen: Synovium; Synovial Fluid  Result Value Ref Range Status   Specimen Description SYNOVIAL  Final   Special Requests RT KNEE  Final   Gram Stain   Final    ABUNDANT WBC PRESENT, PREDOMINANTLY PMN NO ORGANISMS SEEN    Culture   Final    NO GROWTH  3 DAYS NO ANAEROBES ISOLATED; CULTURE IN PROGRESS FOR 5 DAYS Performed at [facility], [address] [place], [place] [address]    Report Status PENDING  Incomplete  Surgical pcr screen     Status: Abnormal   Collection Time: 03/23/23  8:25 AM   Specimen: Nasal Mucosa; Nasal Swab  Result Value Ref Range Status   MRSA, PCR NEGATIVE NEGATIVE Final   Staphylococcus aureus POSITIVE (A) NEGATIVE Final    Comment: (NOTE) The Xpert SA Assay (FDA approved for NASAL specimens in patients 18 years of age and older), is one component of a comprehensive surveillance program. It is not intended to diagnose infection nor to guide or monitor treatment. Performed at [facility], [address] [place], [place] [address]          Radiology Studies: No results found.      Scheduled Meds:  Chlorhexidine  Gluconate Cloth  6 each Topical Q0600   enoxaparin (LOVENOX) injection  30 mg Subcutaneous Q24H   lidocaine  1 patch Transdermal Q24H   methylPREDNISolone (SOLU-MEDROL) injection  40 mg Intravenous Daily   mupirocin ointment  1 Application Nasal BID   senna-docusate  1 tablet Oral BID   Continuous Infusions:  cefTRIAXone (ROCEPHIN)  IV 2 g (03/23/23 0046)   vancomycin 1,250 mg (03/22/23 2140)     LOS: 3 days    Time spent: 35 minutes      Ramiro Harvest, MD [facility]   To contact the attending provider between 7A-7P or the covering provider during after hours 7P-7A, please log into the web site [email or web address] and access using universal [place] password for that web site. If you do not have the password, please call the hospital operator.  03/23/2023, 12:48 PM

## 2023-03-23 NOTE — Transfer of Care (Signed)
Immediate Anesthesia Transfer of Care Note  Patient: Monica Erickson  Procedure(s) Performed: IRRIGATION AND DEBRIDEMENT KNEE WITH POLY EXCHANGE (Right: Knee)  Patient Location: PACU  Anesthesia Type:General  Level of Consciousness: awake  Airway & Oxygen Therapy: Patient Spontanous Breathing and Patient connected to face mask oxygen  Post-op Assessment: Report given to RN and Post -op Vital signs reviewed and stable  Post vital signs: Reviewed and stable  Last Vitals:  Vitals Value Taken Time  BP 152/86 03/23/23 2020  Temp 36.4 C 03/23/23 2020  Pulse 72 03/23/23 2026  Resp 11 03/23/23 2026  SpO2 100 % 03/23/23 2026  Vitals shown include unvalidated device data.  Last Pain:  Vitals:   03/23/23 1720  TempSrc: Axillary  PainSc:          Complications: No notable events documented.

## 2023-03-23 NOTE — Anesthesia Procedure Notes (Signed)
Procedure Name: Intubation Date/Time: 03/23/2023 6:40 PM  Performed by: Tressia Miners, CRNAPre-anesthesia Checklist: Patient identified, Emergency Drugs available, Suction available, Patient being monitored and Timeout performed Patient Re-evaluated:Patient Re-evaluated prior to induction Oxygen Delivery Method: Circle system utilized Preoxygenation: Pre-oxygenation with 100% oxygen Induction Type: IV induction Ventilation: Mask ventilation without difficulty Laryngoscope Size: Mac and 3 Grade View: Grade I Tube type: Oral Tube size: 7.0 mm Number of attempts: 1 Airway Equipment and Method: Stylet Placement Confirmation: ETT inserted through vocal cords under direct vision, positive ETCO2 and breath sounds checked- equal and bilateral Secured at: 21 cm Tube secured with: Tape Dental Injury: Teeth and Oropharynx as per pre-operative assessment  Comments: Smooth IV Induction. Eyes taped. Easy mask. DL x 1 with grade 1 view. Atraumatically placed, teeth and lip remain intact as pre-op. Secured with tape. Bilateral breath sounds +/=, EtCO2 +, Adequate TV, VSS.

## 2023-03-23 NOTE — Progress Notes (Signed)
Patient alert, disoriented x 4. Unable to obtain informations from the patient or family. MD aware.

## 2023-03-23 NOTE — Evaluation (Signed)
Occupational Therapy Evaluation Patient Details Name: Monica Erickson MRN: [id number] DOB: [birth year] Today's Date: 03/23/2023   History of Present Illness 77 y.o. female with a history of dementia.  She is cared for by her daughter Monica Erickson who is at the bedside.  Per daughter patient has been less active than usual and appears to be in pain.  She has had no direct trauma, falls, redness, or abnormalities noted.  Patient complains of diffuse pain. Pt found to have rt knee effusion and IR consulted for aspiration of knee. PMH significant of dementia, chronic headaches, depression, gastric ulcer, H. pylori infection, nephrolithiasis, sleep apnea not on CPAP.  Recently seen in the ED on 4/5 for dehydration, UTI, and possible H. pylori infection.   Clinical Impression   Patient admitted for the diagnosis above.  She is scheduled for I&D of her knee tomorrow morning.  Patient limited by cognitive dysfunction, and resistant to all attempted mobility.  OT will attempted post I&D to try and determine rehab potential.  SNF has been recommended initially, but she may do better at home with family and a more familiar environment.  OT can try a couple of times post surgery.         Recommendations for follow up therapy are one component of a multi-disciplinary discharge planning process, led by the attending physician.  Recommendations may be updated based on patient status, additional functional criteria and insurance authorization.   Assistance Recommended at Discharge Frequent or constant Supervision/Assistance  Patient can return home with the following Help with stairs or ramp for entrance;Assist for transportation;Assistance with cooking/housework;Two people to help with walking and/or transfers;A lot of help with bathing/dressing/bathroom    Functional Status Assessment  Patient has had a recent decline in their functional status and demonstrates the ability to make significant improvements in function in a  reasonable and predictable amount of time.  Equipment Recommendations  None recommended by OT    Recommendations for Other Services       Precautions / Restrictions Precautions Precautions: Fall Restrictions Weight Bearing Restrictions: No      Mobility Bed Mobility Overal bed mobility: Needs Assistance             General bed mobility comments: Patient continues to resist any attempts at supine to sit.    Transfers                          Balance                                           ADL either performed or assessed with clinical judgement   ADL Overall ADL's : Needs assistance/impaired     Grooming: Wash/dry hands;Wash/dry face;Minimal assistance;Bed level   Upper Body Bathing: Moderate assistance;Bed level   Lower Body Bathing: Maximal assistance;Bed level   Upper Body Dressing : Moderate assistance;Bed level   Lower Body Dressing: Maximal assistance;Bed level                       Vision   Vision Assessment?: No apparent visual deficits     Perception     Praxis      Pertinent Vitals/Pain Pain Assessment Pain Assessment: Faces Faces Pain Scale: Hurts even more Pain Location: Appears to be rt knee as well as generalized with movement. Pain Descriptors / Indicators: Grimacing,  Moaning Pain Intervention(s): Monitored during session     Hand Dominance Right   Extremity/Trunk Assessment Upper Extremity Assessment Upper Extremity Assessment: Generalized weakness   Lower Extremity Assessment Lower Extremity Assessment: Defer to PT evaluation   Cervical / Trunk Assessment Cervical / Trunk Assessment: Kyphotic   Communication     Cognition Arousal/Alertness: Awake/alert Behavior During Therapy: Restless, Anxious Overall Cognitive Status: No family/caregiver present to determine baseline cognitive functioning                               Problem Solving: Slow processing, Decreased  initiation, Difficulty sequencing       General Comments       Exercises     Shoulder Instructions      Home Living Family/patient expects to be discharged to:: Skilled nursing facility Living Arrangements: Children;Other relatives   Type of Home: House Home Access: Stairs to enter [employer] of Steps: flight   Home Layout: Two level                          Prior Functioning/Environment Prior Level of Function : Patient poor historian/Family not available                        OT Problem List: Impaired balance (sitting and/or standing);Decreased cognition;Pain      OT Treatment/Interventions: Self-care/ADL training;Therapeutic activities;Balance training    OT Goals(Current goals can be found in the care plan section) Acute Rehab OT Goals OT Goal Formulation: Patient unable to participate in goal setting Time For Goal Achievement: 04/06/23 Potential to Achieve Goals: Fair ADL Goals Pt Will Perform Grooming: with set-up;sitting Pt Will Perform Upper Body Bathing: with set-up;sitting Pt Will Perform Upper Body Dressing: with set-up;sitting Pt Will Transfer to Toilet: with mod assist;with +2 assist;stand pivot transfer;bedside commode  OT Frequency: Min 2X/week    Co-evaluation              AM-PAC OT "6 Clicks" Daily Activity     Outcome Measure Help from another person eating meals?: A Little Help from another person taking care of personal grooming?: A Little Help from another person toileting, which includes using toliet, bedpan, or urinal?: Total Help from another person bathing (including washing, rinsing, drying)?: A Lot Help from another person to put on and taking off regular upper body clothing?: A Lot Help from another person to put on and taking off regular lower body clothing?: Total 6 Click Score: 12   End of Session Nurse Communication: Mobility status  Activity Tolerance: Other (comment) (Limited by  cognition) Patient left: in bed;with call bell/phone within reach;with bed alarm set  OT Visit Diagnosis: Pain Pain - Right/Left: Right Pain - part of body: Knee                Time: 7829-5621 OT Time Calculation (min): 18 min Charges:  OT General Charges $OT Visit: 1 Visit OT Evaluation $OT Eval Moderate Complexity: 1 Mod  03/23/2023  RP, OTR/L  Acute Rehabilitation Services  Office:  [phone]   Suzanna Obey 03/23/2023, 8:52 AM

## 2023-03-23 NOTE — Care Management Important Message (Signed)
Important Message  Patient Details  Name: Adelyna Milliken MRN: [id number] Date of Birth: [birth year]   Medicare Important Message Given:  Yes     Sherilyn Banker 03/23/2023, 1:07 PM

## 2023-03-24 ENCOUNTER — Other Ambulatory Visit: Payer: Self-pay

## 2023-03-24 DIAGNOSIS — F0390 Unspecified dementia without behavioral disturbance: Secondary | ICD-10-CM | POA: Diagnosis not present

## 2023-03-24 DIAGNOSIS — T8453XD Infection and inflammatory reaction due to internal right knee prosthesis, subsequent encounter: Secondary | ICD-10-CM | POA: Diagnosis not present

## 2023-03-24 DIAGNOSIS — M25461 Effusion, right knee: Secondary | ICD-10-CM | POA: Diagnosis not present

## 2023-03-24 DIAGNOSIS — Z96651 Presence of right artificial knee joint: Secondary | ICD-10-CM | POA: Diagnosis not present

## 2023-03-24 DIAGNOSIS — G4733 Obstructive sleep apnea (adult) (pediatric): Secondary | ICD-10-CM | POA: Diagnosis not present

## 2023-03-24 LAB — CBC
HCT: 36 % (ref 36.0–46.0)
Hemoglobin: 11.8 g/dL — ABNORMAL LOW (ref 12.0–15.0)
MCH: 29.1 pg (ref 26.0–34.0)
MCHC: 32.8 g/dL (ref 30.0–36.0)
MCV: 88.7 fL (ref 80.0–100.0)
Platelets: 368 10*3/uL (ref 150–400)
RBC: 4.06 MIL/uL (ref 3.87–5.11)
RDW: 14.1 % (ref 11.5–15.5)
WBC: 12.2 10*3/uL — ABNORMAL HIGH (ref 4.0–10.5)
nRBC: 0 % (ref 0.0–0.2)

## 2023-03-24 LAB — BASIC METABOLIC PANEL
Anion gap: 13 (ref 5–15)
BUN: 17 mg/dL (ref 8–23)
CO2: 19 mmol/L — ABNORMAL LOW (ref 22–32)
Calcium: 9.1 mg/dL (ref 8.9–10.3)
Chloride: 105 mmol/L (ref 98–111)
Creatinine, Ser: 0.89 mg/dL (ref 0.44–1.00)
GFR, Estimated: >60 mL/min (ref >60)
Glucose, Bld: 107 mg/dL — ABNORMAL HIGH (ref 70–99)
Potassium: 3.9 mmol/L (ref 3.5–5.1)
Sodium: 137 mmol/L (ref 135–145)

## 2023-03-24 LAB — GLUCOSE, CAPILLARY
Glucose-Capillary: 125 mg/dL — ABNORMAL HIGH (ref 70–99)
Glucose-Capillary: 117 mg/dL — ABNORMAL HIGH (ref 70–99)

## 2023-03-24 LAB — AEROBIC/ANAEROBIC CULTURE W GRAM STAIN (SURGICAL/DEEP WOUND): Culture: NO GROWTH

## 2023-03-24 MED ORDER — SODIUM CHLORIDE 0.9% FLUSH
10.0000 mL | Freq: Two times a day (BID) | INTRAVENOUS | Status: DC
  Administered 2023-03-24 – 2023-03-27 (×6): 10 mL

## 2023-03-24 MED ORDER — SODIUM CHLORIDE 0.9% FLUSH: 10.0000 mL | INTRAVENOUS | Status: DC | PRN

## 2023-03-24 MED ORDER — ASPIRIN 81 MG PO CHEW
81.0000 mg | CHEWABLE_TABLET | Freq: Two times a day (BID) | ORAL | Status: DC
  Administered 2023-03-24 – 2023-03-27 (×7): 81 mg via ORAL
  Filled 2023-03-24 (×7): qty 1

## 2023-03-24 NOTE — Consult Note (Signed)
[facility]    Date of Admission:  03/16/2023   Total days of inpatient antibiotics         Reason for Consult: C/F PJI    Principal Problem:   Knee effusion, right Active Problems:   Dementia without behavioral disturbance (HCC)   OSA (obstructive sleep apnea)   Infection of prosthetic right knee joint 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  History of advanced dementia lives at home with family, per documentation family provided history.  Complaining of right knee pain which led to admission 03/16/2023.  Patient afebrile and without leukocytosis on admission. Underwent IR guided arthrocentesis which showed 1100 WBCs 92% neutrophils and crystals.  Ortho was engaged, given provide family history of right knee pain aspiration findings there was high concern for infection.  Patient taken to the OR. - PICC in place Recommendations:  -Unclear if knee is truly infected, no purulence noted in OR note.  The aspirate showed 11 K WBC 92% neutrophils with intracellular calciums pyrophosphate crystals and negative cultures.  I will communicate with orthopedics to discuss findings of concern that this may be inflammatory versus infection. - Follow-up or cultures - Okay to continue vancomycin and ceftriaxone for now   #Advanced dementia - Unable to provide history, will discuss with team as above Microbiology:   Antibiotics: Vancomycin 5/10-present Ceftriaxone 5/10-present Cefazolin 5/13  Cultures:  Other 5/10 Synovial cell count with 09[birth year]1949 WBC, 92% neutrophils, 6% lymphocytes, 2% monocytes, intracellular calcium pyrophosphate crystals noted. CX with no growth x 4 days  HPI: Monica Erickson is a 77 y.o. female advanced dementia, chronic headaches, depression,  gastritis and H. 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  CT showed moderate supra patellar effusion, underwent arthrocentesis with IR with WBC 11 K, 92% neutrophils. Taken to the OR on 5/13 for excisional and nonexcisional debridement of right knee and polyethylene exchange.  Per OR note, blood-tinged synovial fluid without obvious purulence.  Fluid was aspirated and sent for cultures.  Review of Systems: Review of Systems  All other systems reviewed and are negative.   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 pylori infection 03/2020   Kidney stones    Night terrors    Sleep apnea    no cpap    Social History   Tobacco Use   Smoking status: Former    Years: 5    Types: Cigarettes   Smokeless tobacco: Never  Vaping Use   Vaping Use: Never used  Substance Use Topics   Alcohol use: Not Currently    Comment: occasional wine   Drug use: Never    Family History  Problem Relation Age of Onset   Diabetes Mother    Hypertension Father    Colon cancer Neg Hx    Esophageal cancer Neg Hx    Stomach cancer Neg Hx    Rectal cancer Neg Hx  Scheduled Meds:  aspirin  81 mg Oral BID   Chlorhexidine Gluconate Cloth  6 each Topical Q0600   lidocaine  1 patch Transdermal Q24H   methylPREDNISolone (SOLU-MEDROL) injection  40 mg Intravenous Daily   mupirocin ointment  1 Application Nasal BID   senna-docusate  1 tablet Oral BID   sodium chloride flush  10-40 mL Intracatheter Q12H   Continuous Infusions:  cefTRIAXone (ROCEPHIN)  IV 2 g (03/24/23 0139)   vancomycin 1,250 mg (03/23/23 2349)   PRN Meds:.acetaminophen **OR** acetaminophen, hydrALAZINE, sodium chloride  flush No Known Allergies  OBJECTIVE: Blood pressure (!) 158/82, pulse 89, temperature 97.8 F (36.6 C), temperature source Oral, resp. rate 18, weight 73 kg, SpO2 91 %.  Physical Exam Constitutional:      Appearance: Normal appearance.  HENT:     Head: Normocephalic and atraumatic.     Right Ear: Tympanic membrane normal.     Left Ear: Tympanic membrane normal.     Nose: Nose normal.     Mouth/Throat:     Mouth: Mucous membranes are moist.  Eyes:     Extraocular Movements: Extraocular movements intact.     Conjunctiva/sclera: Conjunctivae normal.     Pupils: Pupils are equal, round, and reactive to light.  Cardiovascular:     Rate and Rhythm: Normal rate and regular rhythm.     Heart sounds: No murmur heard.    No friction rub. No gallop.  Pulmonary:     Effort: Pulmonary effort is normal.     Breath sounds: Normal breath sounds.  Abdominal:     General: Abdomen is flat.     Palpations: Abdomen is soft.  Musculoskeletal:     Comments: Right leg bandaged  Skin:    General: Skin is warm and dry.  Neurological:     Mental Status: She is alert.  Psychiatric:        Mood and Affect: Mood normal.     Lab Results Lab Results  Component Value Date   WBC 12.2 (H) 03/24/2023   HGB 11.8 (L) 03/24/2023   HCT 36.0 03/24/2023   MCV 88.7 03/24/2023   PLT 368 03/24/2023    Lab Results  Component Value Date   CREATININE 0.89 03/24/2023   BUN 17 03/24/2023   NA 137 03/24/2023   K 3.9 03/24/2023   CL 105 03/24/2023   CO2 19 (L) 03/24/2023    Lab Results  Component Value Date   ALT 11 03/16/2023   AST 22 03/16/2023   ALKPHOS 91 03/16/2023   BILITOT 1.5 (H) 03/16/2023       Danelle Earthly, MD [facility] 03/24/2023, 12:41 PM   I have personally spent 82 minutes involved in face-to-face and non-face-to-face activities for this patient on the day of the visit. Professional time spent includes the following  activities: Preparing to see the patient (review of tests), Obtaining and/or reviewing separately obtained history (admission/discharge record), Performing a medically appropriate examination and/or evaluation , Ordering medications/tests/procedures, referring and communicating with other health care professionals, Documenting clinical information in the EMR, Independently interpreting results (not separately reported), Communicating results to the patient/family/caregiver, Counseling and educating the patient/family/caregiver and Care coordination (not separately reported).

## 2023-03-24 NOTE — Progress Notes (Signed)
   Subjective: 1 Day Post-Op Procedure(s) (LRB): IRRIGATION AND DEBRIDEMENT KNEE WITH POLY EXCHANGE (Right)  Patient seen in rounds for Dr. Charlann Boxer. Patient with advanced dementia. She is sitting up in bed this morning and appears comfortable She said a few words to me  We will start therapy today.   Objective: Vital signs in last 24 hours: Temp:  [97.5 F (36.4 C)-98.9 F (37.2 C)] 98 F (36.7 C) (05/14 0410) Pulse Rate:  [62-93] 78 (05/14 0410) Resp:  [13-21] 18 (05/14 0410) BP: (129-171)/(74-109) 171/89 (05/14 0410) SpO2:  [97 %-100 %] 99 % (05/14 0410)  Intake/Output from previous day:  Intake/Output Summary (Last 24 hours) at 03/24/2023 0718 Last data filed at 03/23/2023 2007 Gross per 24 hour  Intake 1200 ml  Output 20 ml  Net 1180 ml     Intake/Output this shift: No intake/output data recorded.  Labs: Recent Labs    03/22/23 0250 03/23/23 0317  HGB 12.1 11.7*   Recent Labs    03/22/23 0250 03/23/23 0317  WBC 7.4 10.2  RBC 4.20 4.03  HCT 37.7 37.6  PLT 243 347   Recent Labs    03/22/23 0250 03/23/23 0317  NA 135 138  K 5.1 4.0  CL 103 106  CO2 19* 22  BUN 27* 25*  CREATININE 0.82 0.89  GLUCOSE 155* 114*  CALCIUM 9.3 9.1   No results for input(s): "LABPT", "INR" in the last 72 hours.  Exam: General - Patient is Alert and confused Extremity - Neurologically intact Sensation intact distally Intact pulses distally Dorsiflexion/Plantar flexion intact Dressing - dressing C/D/I Motor Function - intact, moving foot and toes well on exam.   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 pylori infection 03/2020   Kidney stones    Night terrors    Sleep apnea    no cpap    Assessment/Plan: 1 Day Post-Op Procedure(s) (LRB): IRRIGATION AND DEBRIDEMENT KNEE WITH  POLY EXCHANGE (Right) Principal Problem:   Knee effusion, right Active Problems:   Dementia without behavioral disturbance (HCC)   OSA (obstructive sleep apnea)   Infection of prosthetic right knee joint (HCC)  Estimated body mass index is 24.47 kg/m as calculated from the following:   Height as of 01/27/23: 5\' 8"  (1.727 m).   Weight as of this encounter: 73 kg.  DVT Prophylaxis - Aspirin 81 mg BID if she will tolerate Weight bearing as tolerated.  PICC to be placed hopefully today  ID consult placed Intra-op cultures pending No growth on ED cultures  Dressing is waterproof and should be left in place unless soiled Up with PT today  Disposition per PT recommendations and primary team  Follow up in the office with [place] in 2 weeks  Dennie Bible, PA-C Orthopedic Surgery [phone] 03/24/2023, 7:18 AM

## 2023-03-24 NOTE — Progress Notes (Signed)
PROGRESS NOTE    Monica Erickson  ZOX:[id number] DOB: 07-29-1946 DOA: 03/16/2023 PCP: Nona Dell, NP   Chief Complaint  Patient presents with   Chest Pain   Multiple Complaints    Brief Narrative:  Patient 77 year old female history of advanced dementia, chronic headaches, depression, gastritis and H. pylori ulcer, nephrolithiasis, sleep apnea not on CPAP, history of bilateral total knee replacement, PE a year ago no longer on anticoagulation noted to have been boarding in the ED since 03/16/2023 and had presented multiple complaints including right knee pain and swelling.  CT showed a moderate suprapatellar effusion.  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  Orthopedics had recommended n.p.o. for possible intervention.   Assessment & Plan:   Principal Problem:   Knee effusion, right Active Problems:   Dementia without behavioral disturbance (HCC)   OSA (obstructive sleep apnea)   Infection of prosthetic right knee joint (HCC)  #1 right knee prosthetic joint infection/?? pseudogout -Patient noted to presented to the ED on 5/6 with right knee pain and swelling. -CT done concerning for moderate suprapatellar effusion. -Patient underwent arthrocentesis by IR and synovial fluid analysis was concerning for prosthetic joint infection.  Fluid analysis also with intracellular calcium pyrophosphate crystals. -Patient afebrile, no significant leukocytosis. -Patient placed empirically on IV vancomycin.  IV Rocephin added. -Orthopedics consulted and initially recommended n.p.o. at midnight for possible intervention. -On initial assessment per orthopedics, Dr. Odis Hollingshead discussed with patient's family as well as discussed with 3 arthroplasty surgeons and it was noted no availability for at least a week to do an explant and recommendations were made to transfer to a tertiary referral center for further evaluation versus  discharging home on antibiotics with outpatient follow-up with orthopedics in 1 week to discuss next steps. -Patient with severe dementia, unsafe disposition as concern for antibiotic compliance. -Tertiary care center contacted, Dr. Jana Half with orthopedics at [facility] reviewed the chart and transfer declined as it was felt patient's care could be managed at [facility] facility. -Orthopedics reconsulted and reengage for further evaluation and recommendations. -Patient seen by Dr. Charlann Boxer of orthopedics who recommended I&D and polyethylene exchange which was done on 03/23/2023 without any complications.  -Continue IV Solu-Medrol 40 mg daily for 5 days due to concerns for possible pseudogout. -Per family patient with difficulty swallowing pills and usually spits them out. -Continue empiric IV antibiotics. -PICC line ordered per orthopedics today in anticipation of IV antibiotics for at least 6 weeks and possible suppressive oral antibiotics after that. -ID consulted and recommended continuation of IV vancomycin and Rocephin for now, ID to discuss further recommendations with orthopedics.   -Per orthopedics.  2. advanced dementia -Delirium precautions.   3.  OSA -Not on CPAP.   DVT prophylaxis: SCDs Code Status: Full Family Communication: No family at bedside.  Disposition: TBD  Status is: Inpatient Remains inpatient appropriate because: Severity of illness/unsafe disposition   Consultants:  Orthopedics: Dr. Odis Hollingshead 03/19/2023 Orthopedics: Dr. Rennis Chris 03/21/2023 ID: Dr. Thedore Mins 03/24/2023  Procedures:  CT head without contrast 03/17/2023 CT of the right knee 03/16/2023 CT abdomen and pelvis 03/16/2023 Chest x-Lisowski 03/16/2023 Plain films of the right knee 03/16/2023 Lower extremity Dopplers 03/17/2023 CT L-spine 03/16/2023 Plain films bilateral hips 03/16/2023 Ultrasound-guided aspiration right knee joint per IR: 03/19/2023 per Dr. Elijio Miles El-Abd Excisional and nonexcisional debridement of right knee  with polyethylene exchange per orthopedics: Dr. Charlann Boxer 03/23/2023  Antimicrobials:  Anti-infectives (From admission, onward)    Start  Dose/Rate Route Frequency Ordered Stop   03/24/23 0600  ceFAZolin (ANCEF) IVPB 2g/100 mL premix  Status:  Discontinued        2 g 200 mL/hr over 30 Minutes Intravenous On call to O.R. 03/23/23 1559 03/23/23 1726   03/23/23 1730  ceFAZolin (ANCEF) IVPB 2g/100 mL premix        2 g 200 mL/hr over 30 Minutes Intravenous On call to O.R. 03/23/23 1726 03/23/23 1901   03/23/23 1601  ceFAZolin (ANCEF) 2-4 GM/100ML-% IVPB       Note to Pharmacy: [facility], [place]: cabinet override      03/23/23 1601 03/23/23 1904   03/21/23 2330  vancomycin (VANCOREADY) IVPB 1250 mg/250 mL        1,250 mg 166.7 mL/hr over 90 Minutes Intravenous Every 24 hours 03/21/23 1327     03/21/23 2300  vancomycin (VANCOREADY) IVPB 750 mg/150 mL  Status:  Discontinued        750 mg 150 mL/hr over 60 Minutes Intravenous Every 24 hours 03/20/23 2209 03/21/23 1327   03/21/23 0100  cefTRIAXone (ROCEPHIN) 2 g in sodium chloride 0.9 % 100 mL IVPB        2 g 200 mL/hr over 30 Minutes Intravenous Every 24 hours 03/21/23 0057     03/20/23 2215  vancomycin (VANCOREADY) IVPB 1250 mg/250 mL        1,250 mg 166.7 mL/hr over 90 Minutes Intravenous  Once 03/20/23 2209 03/21/23 0123         Subjective: Sitting up in bed, nurse tech at bedside trying to feed patient lunch.  Pleasantly confused.    Objective: Vitals:   03/23/23 2125 03/23/23 2128 03/24/23 0410 03/24/23 0729  BP: (!) 163/109 (!) 158/74 (!) 171/89 (!) 158/82  Pulse: 93 90 78 89  Resp: 20  18   Temp: 97.8 F (36.6 C)  98 F (36.7 C) 97.8 F (36.6 C)  TempSrc: Oral  Oral Oral  SpO2: 100%  99% 91%  Weight:        Intake/Output Summary (Last 24 hours) at 03/24/2023 1257 Last data filed at 03/23/2023 2007 Gross per 24 hour  Intake 1200 ml  Output 20 ml  Net 1180 ml    Filed Weights   03/21/23 0137  Weight: 73 kg     Examination:  General exam: NAD Respiratory system: Lungs clear auscultation bilaterally anterior lung fields.  No wheezes, no crackles, no rhonchi.  Fair air movement.  Speaking in full sentences.   Cardiovascular system: Regular rate rhythm no murmurs rubs or gallops.  No JVD.  No lower extremity edema.  Gastrointestinal system: Abdomen is soft, nontender, nondistended, positive bowel sounds.  No rebound.  No guarding.  Central nervous system: Alert.  Moving extremities spontaneously.  No focal neurological deficits. Extremities: Right knee with decreased warmth, decreased tenderness to palpation.  No significant erythema. Skin: No rashes, lesions or ulcers Psychiatry: Judgement and insight appear poor. Mood & affect appropriate.     Data Reviewed: I have personally reviewed following labs and imaging studies  CBC: Recent Labs  Lab 03/19/23 1329 03/21/23 0218 03/22/23 0250 03/23/23 0317 03/24/23 0949  WBC 6.8 5.8 7.4 10.2 12.2*  NEUTROABS 4.4  --  6.1  --   --   HGB 11.8* 14.2 12.1 11.7* 11.8*  HCT 35.6* 44.6 37.7 37.6 36.0  MCV 89.9 91.6 89.8 93.3 88.7  PLT 313 273 243 347 368     Basic Metabolic Panel: Recent Labs  Lab 03/21/23 0218 03/22/23 0250  03/23/23 0317 03/24/23 0949  NA 138 135 138 137  K 4.4 5.1 4.0 3.9  CL 102 103 106 105  CO2 20* 19* 22 19*  GLUCOSE 89 155* 114* 107*  BUN 25* 27* 25* 17  CREATININE 0.91 0.82 0.89 0.89  CALCIUM 9.6 9.3 9.1 9.1     GFR: Estimated Creatinine Clearance: 55.1 mL/min (by C-G formula based on SCr of 0.89 mg/dL).  Liver Function Tests: No results for input(s): "AST", "ALT", "ALKPHOS", "BILITOT", "PROT", "ALBUMIN" in the last 168 hours.   CBG: Recent Labs  Lab 03/24/23 0159 03/24/23 0731  GLUCAP 125* 117*     Recent Results (from the past 240 hour(s))  Aerobic/Anaerobic Culture w Gram Stain (surgical/deep wound)     Status: None (Preliminary result)   Collection Time: 03/20/23 10:42 AM   Specimen:  Synovium; Synovial Fluid  Result Value Ref Range Status   Specimen Description SYNOVIAL  Final   Special Requests RT KNEE  Final   Gram Stain   Final    ABUNDANT WBC PRESENT, PREDOMINANTLY PMN NO ORGANISMS SEEN    Culture   Final    NO GROWTH 4 DAYS NO ANAEROBES ISOLATED; CULTURE IN PROGRESS FOR 5 DAYS Performed at [facility], [address], [place], [place] [address]    Report Status PENDING  Incomplete  Surgical pcr screen     Status: Abnormal   Collection Time: 03/23/23  8:25 AM   Specimen: Nasal Mucosa; Nasal Swab  Result Value Ref Range Status   MRSA, PCR NEGATIVE NEGATIVE Final   Staphylococcus aureus POSITIVE (A) NEGATIVE Final    Comment: (NOTE) The Xpert SA Assay (FDA approved for NASAL specimens in patients 24 years of age and older), is one component of a comprehensive surveillance program. It is not intended to diagnose infection nor to guide or monitor treatment. Performed at [facility], [address] [place], [place] [address]   Aerobic/Anaerobic Culture w Gram Stain (surgical/deep wound)     Status: None (Preliminary result)   Collection Time: 03/23/23  7:42 PM   Specimen: Soft Tissue, Other  Result Value Ref Range Status   Specimen Description TISSUE RIGHT KNEE  Final   Special Requests SAMPLE A  Final   Gram Stain   Final    FEW WBC PRESENT,BOTH PMN AND MONONUCLEAR NO ORGANISMS SEEN    Culture   Final    NO GROWTH < 24 HOURS Performed at [facility], [address], [place], [place] [address]    Report Status PENDING  Incomplete  Aerobic/Anaerobic Culture w Gram Stain (surgical/deep wound)     Status: None (Preliminary result)   Collection Time: 03/23/23  7:54 PM   Specimen: Path fluid; Tissue  Result Value Ref Range Status   Specimen Description FLUID RIGHT KNEE  Final   Special Requests SAMPLE B  Final   Gram Stain NO WBC SEEN NO ORGANISMS SEEN   Final   Culture   Final    NO GROWTH < 24 HOURS Performed at [facility], [address], [place], [place] [address]    Report Status PENDING  Incomplete         Radiology Studies: [place] EKG SITE RITE  Result Date: 03/24/2023 If Site Rite image not attached, placement could not be confirmed due to current cardiac rhythm.       Scheduled Meds:  aspirin  81 mg Oral BID   Chlorhexidine Gluconate Cloth  6 each Topical  Q0600   lidocaine  1 patch Transdermal Q24H   methylPREDNISolone (SOLU-MEDROL) injection  40 mg Intravenous Daily   mupirocin ointment  1 Application Nasal BID   senna-docusate  1 tablet Oral BID   sodium chloride flush  10-40 mL Intracatheter Q12H   Continuous Infusions:  cefTRIAXone (ROCEPHIN)  IV 2 g (03/24/23 0139)   vancomycin 1,250 mg (03/23/23 2349)     LOS: 4 days    Time spent: 35 minutes      Ramiro Harvest, MD [facility]   To contact the attending provider between 7A-7P or the covering provider during after hours 7P-7A, please log into the web site [email or web address] and access using universal [place] password for that web site. If you do not have the password, please call the hospital operator.  03/24/2023, 12:57 PM

## 2023-03-24 NOTE — Evaluation (Signed)
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 pylori infection 03/2020   Kidney stones    Night terrors    Sleep apnea    no cpap   Past Surgical History:  Past Surgical History:  Procedure Laterality Date   BIOPSY  03/22/2020   Procedure: BIOPSY;  Surgeon: Rachael Fee, MD;  Location: [facility];  Service: Endoscopy;;   Breast Lift     CATARACT EXTRACTION Bilateral 01/2020   COLONOSCOPY WITH PROPOFOL N/A 03/22/2020   Procedure: COLONOSCOPY WITH PROPOFOL;  Surgeon: Rachael Fee, MD;  Location: [facility];  Service: Endoscopy;  Laterality: N/A;   CYSTOSCOPY WITH STENT PLACEMENT Right 12/07/2018   Procedure: CYSTOSCOPY WITH STENT PLACEMENT;  Surgeon: Crist Fat, MD;  Location: [facility];  Service: Urology;  Laterality: Right;   CYSTOSCOPY/URETEROSCOPY/HOLMIUM LASER/STENT PLACEMENT Right 12/31/2018   Procedure: CYSTOSCOPY RIGHT URETEROSCOPY/HOLMIUM LASER RIGHT Lucretia Roers;  Surgeon: Crist Fat, MD;  Location: [facility];  Service: Urology;  Laterality: Right;   ESOPHAGOGASTRODUODENOSCOPY (EGD) WITH PROPOFOL N/A 03/22/2020   Procedure: ESOPHAGOGASTRODUODENOSCOPY (EGD) WITH PROPOFOL;  Surgeon: Rachael Fee, MD;  Location: [facility];  Service: Endoscopy;  Laterality: N/A;   REPLACEMENT TOTAL KNEE Bilateral 5 YRS AGO   Tummy tuck     HPI:  77 year old female presented to ED 03/16/2023 from home with multiple complaints including right knee pain and swelling.  CT showed a moderate  suprapatellar effusion.  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 5/13 underwent right knee irrigation and debridement. Prior medical history of advanced dementia, chronic headaches, depression, gastritis and H. pylori ulcer, nephrolithiasis, sleep apnea not on CPAP, history of bilateral total knee replacement, PE. Hx of poor PO intake; family has declined G tube in the past.  Bedside swallow evaluation 02/24/2022 with no obvious dysphagia - regular solids/thin liquids were recommended.    Assessment / Plan / Recommendation  Clinical Impression  Pt does not present with s/s of an oropharyngeal dysphagia.  She did not follow commands, but participated in assessment and was amenable to drinking and eating small portions. She demonstrated adequate oral recognition/mastication despite absence of teeth.  She drank multiple sips of water from a straw without difficulty.  She has a hx of poor PO intake but is swallowiny well given her advanced dementia. Recommend continuing current diet to maximize food choices; continue thin liquids. Crush meds if large; otherwise give whole with puree. D/W RN. No SLP needs identified; no f/u recommended. SLP Visit Diagnosis: Dysphagia, unspecified (R13.10)    Aspiration Risk  No limitations    Diet Recommendation     Medication Administration: Whole meds with puree    Other  Recommendations Oral Care Recommendations: Oral care BID    Recommendations for follow up therapy are one component of a multi-disciplinary discharge planning process, led by the attending physician.  Recommendations may be updated based on patient status, additional functional criteria and insurance authorization.  Follow  up Recommendations No SLP follow up        Swallow Study   General HPI: 77 year old female presented to ED 03/16/2023 from home with multiple complaints including right knee pain and swelling.  CT showed a  moderate suprapatellar effusion.  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 5/13 underwent right knee irrigation and debridement. Prior medical history of advanced dementia, chronic headaches, depression, gastritis and H. pylori ulcer, nephrolithiasis, sleep apnea not on CPAP, history of bilateral total knee replacement, PE. Hx of poor PO intake; family has declined G tube in the past.  Bedside swallow evaluation 02/24/2022 with no obvious dysphagia - regular solids/thin liquids were recommended. Type of Study: Bedside Swallow Evaluation Previous Swallow Assessment: see HPI Diet Prior to this Study: Regular;Thin liquids (Level 0) Temperature Spikes Noted: No Respiratory Status: Room air Behavior/Cognition: Alert Oral Cavity Assessment: Within Functional Limits Oral Care Completed by SLP: Recent completion by staff Oral Cavity - Dentition: Edentulous Vision: Functional for self-feeding Self-Feeding Abilities: Needs assist;Able to feed self Patient Positioning: Upright in bed Baseline Vocal Quality: Normal Volitional Cough: Cognitively unable to elicit Volitional Swallow: Unable to elicit    Oral/Motor/Sensory Function Overall Oral Motor/Sensory Function: Within functional limits   Ice Chips Ice chips: Within functional limits   Thin Liquid Thin Liquid: Within functional limits    Nectar Thick Nectar Thick Liquid: Not tested   Honey Thick Honey Thick Liquid: Not tested   Puree Puree: Within functional limits   Solid     Solid: Within functional limits      Blenda Mounts Laurice 03/24/2023,10:31 AM  Marchelle Folks L. Samson Frederic, MA CCC/SLP Clinical Specialist - Acute Care SLP Acute Rehabilitation Services Office number [phone]

## 2023-03-24 NOTE — Op Note (Signed)
NAMERACQUELL, FREEBURN MEDICAL RECORD NO: [id number] ACCOUNT NO: [id number] DATE OF BIRTH: 1946-07-30 FACILITY: [facility] LOCATION: [facility] PHYSICIAN: Madlyn Frankel. Charlann Boxer, MD  Operative Report   DATE OF PROCEDURE: 03/23/2023  PREOPERATIVE DIAGNOSIS:  Infected right total knee arthroplasty.  POSTOPERATIVE DIAGNOSIS:  Infected right total knee arthroplasty.  PROCEDURE:  Excisional and non-excisional debridement of right knee with polyethylene exchange.  Excisional debridement was carried out sharply with a knife over an 8 inch incision including skin, soft tissue, nonviable tissue, synovium and bone.   Non-excisional debridement consisted of 6 liters of normal saline solution with pulse lavage and one bottle of Prontosan antimicrobial solution.  COMPONENTS USED:  Katrinka Blazing and nephew size 5/6 10 mm posterior stabilized high flex insert to match her previously placed Journey knee system.  SURGEON:  Madlyn Frankel. Charlann Boxer, MD  ASSISTANT:  Rosalene Billings, PA-C.  Note that Ms. Domenic Schwab was present for the entirety of the case from preoperative positioning, perioperative management of the operative extremity, general facilitation of the case and primary wound closure.  ANESTHESIA:  General.  TOURNIQUET TIME:  26 minutes at 225 mmHg.  DRAINS:  None.  BLOOD LOSS:  Less than 200 mL.  COMPLICATIONS:  None.  INDICATIONS FOR PROCEDURE:  The patient is a 77 year old female with a history of a right total knee arthroplasty performed per family's notations or recall of about 15 years ago performed in [place], [place].  She has a history of dementia and  lives at home with family.  Discussion with the family had indicated that she was fairly functional with regards to her activity level around the house.  She has been complaining of increasing pain in the right knee as well as other medical comorbidities  that led to her admission to the hospital on around 03/16/2023.  Based on her ongoing right knee pain,  orthopedics had been consulted.  Aspiration of her knee was performed through interventional radiology.  Aspiration results are available in the  computer, but included an elevated white blood cell count from the aspirate of about 17,000 with a noted shift of about 92% neutrophils.  The culture to date remains no growth.  There was some evidence of extracellular crystals noted as well.  Based on  her presentation and reports by the family about the onset of right knee pain and the complaints as well as the aspiration results, there is a high level of concern of an acute infection involving her right knee.  I was asked to be involved in management  and decision making processes.  I felt it was in her best interest at this point to proceed as an infection and perform an open excisional debridement with polyethylene exchange with attempted joint salvage with 6 weeks of IV antibiotics to follow up  with potential suppressive antibiotics based on tolerance.  This was all reviewed extensively with the family prior to decision making to proceed with surgery.  We reviewed the potential for persistent infection and need for future surgeries as well as  standard risk for DVT as well as the postoperative recovery necessary.  Consent was obtained for management of her right knee and potential infection.  PROCEDURE IN DETAIL:  The patient was brought to the operative theater.  Once adequate anesthesia and preoperative antibiotics including Ancef 2 grams in addition to her preoperative use of ceftriaxone daily and vancomycin, she was positioned supine with  a right thigh tourniquet placed.  The right lower extremity was prepped and draped in sterile fashion.  A timeout was performed identifying the patient, planned procedure, and extremity.  The leg was exsanguinated and tourniquet elevated to 225 mmHg.   Midline incision was excised over about an 8 inch incision and her skin excised.  Soft tissue planes were created  with debridement of subcutaneous fat tissue.  A median arthrotomy was made, encountering a blood-tinged synovial fluid without obvious  purulence.  This fluid was aspirated and sent for culture.  At this point, I performed an extensive debridement involving her medial synovium and scar in the suprapatellar region as well as in the lateral and parapatellar region.  Soft tissue was sent  for pathology evaluation again, as well to rule out and evaluate for Gram stain, culture, anaerobic and aerobic.  Following this, extensive debridement, however, I was able to remove the old polyethylene.  This allowed for further debridement as well as  non-excisional debridement of the posterior aspect of her knee.  At this point, we irrigated the knee with first 3 liters of normal saline solution followed by 3/4 of the bottle of the Prontosan solution, which was left in the knee for a few minutes.  We  then changed our gloves and opened up the new polyethylene insert.  I selected a 10 mm insert.  This was snapped into place.  Once this was done, I reirrigated the knee with another 3 liters of normal saline solution followed by the remaining block  portion of the bottle of Prontosan.  Once this was done, the tourniquet had been let down.  There was noted to be no significant hemostasis required other than that expected from synovectomy.  The knee was then brought to about 60 degrees of flexion and  the extensor mechanism reapproximated using #1 Vicryl and #1 Stratafix suture.  The remainder of the wound was closed in layers with 2-0 Vicryl and a running Monocryl stitch.  The knee was clean, dry and dressed sterilely with surgical glue and Aquacel  dressing.  The knee was wrapped in an Ace wrap and she was awoken from anesthesia and brought to the recovery room in stable condition, tolerating the procedure well.  The findings were reviewed with the family.  Postoperatively, she should have a PICC line placed with plans to  have 6 weeks of IV antibiotics performed, in addition to routine physical therapy to restore her functional range of motion and activity level.   From there, the decision for long-term suppression of either 3 or 6 months for life suppressive antibiotics could be made in the outpatient setting with the family.   NIK D: 03/23/2023 10:37:21 pm T: 03/24/2023 12:18:00 am  JOB: [id number]/ [id number]

## 2023-03-24 NOTE — Progress Notes (Signed)
Peripherally Inserted Central Catheter Placement  The IV Nurse has discussed with the patient and/or persons authorized to consent for the patient, the purpose of this procedure and the potential benefits and risks involved with this procedure.  The benefits include less needle sticks, lab draws from the catheter, and the patient may be discharged home with the catheter. Risks include, but not limited to, infection, bleeding, blood clot (thrombus formation), and puncture of an artery; nerve damage and irregular heartbeat and possibility to perform a PICC exchange if needed/ordered by physician.  Alternatives to this procedure were also discussed.  Bard Power PICC patient education guide, fact sheet on infection prevention and patient information card has been provided to patient /or left at bedside.    Consent obtained with daughter via telephone  PICC Placement Documentation  PICC Single Lumen 03/24/23 Right Brachial 35 cm 0 cm (Active)  Indication for Insertion or Continuance of Line Home intravenous therapies (PICC only) 03/24/23 1100  Exposed Catheter (cm) 0 cm 03/24/23 1100  Site Assessment Clean, Dry, Intact 03/24/23 1100  Line Status Flushed;Saline locked;Blood return noted 03/24/23 1100  Dressing Type Transparent;Securing device 03/24/23 1100  Dressing Status Antimicrobial disc in place;Clean, Dry, Intact 03/24/23 1100  Safety Lock Not Applicable 03/24/23 1100  Line Care Connections checked and tightened 03/24/23 1100  Line Adjustment (NICU/IV Team Only) No 03/24/23 1100  Dressing Intervention New dressing 03/24/23 1100  Dressing Change Due 03/31/23 03/24/23 1100       Franne Grip Renee 03/24/2023, 11:21 AM

## 2023-03-25 ENCOUNTER — Encounter (HOSPITAL_COMMUNITY): Payer: Self-pay | Admitting: Orthopedic Surgery

## 2023-03-25 DIAGNOSIS — Z96651 Presence of right artificial knee joint: Secondary | ICD-10-CM | POA: Diagnosis not present

## 2023-03-25 DIAGNOSIS — M25461 Effusion, right knee: Secondary | ICD-10-CM | POA: Diagnosis not present

## 2023-03-25 DIAGNOSIS — F0390 Unspecified dementia without behavioral disturbance: Secondary | ICD-10-CM | POA: Diagnosis not present

## 2023-03-25 LAB — CBC WITH DIFFERENTIAL/PLATELET
Abs Immature Granulocytes: 0.11 10*3/uL — ABNORMAL HIGH (ref 0.00–0.07)
Basophils Absolute: 0 10*3/uL (ref 0.0–0.1)
Basophils Relative: 1 %
Eosinophils Absolute: 0.2 10*3/uL (ref 0.0–0.5)
Eosinophils Relative: 2 %
HCT: 31.7 % — ABNORMAL LOW (ref 36.0–46.0)
Hemoglobin: 10.3 g/dL — ABNORMAL LOW (ref 12.0–15.0)
Immature Granulocytes: 1 %
Lymphocytes Relative: 21 %
Lymphs Abs: 1.8 10*3/uL (ref 0.7–4.0)
MCH: 29.3 pg (ref 26.0–34.0)
MCHC: 32.5 g/dL (ref 30.0–36.0)
MCV: 90.3 fL (ref 80.0–100.0)
Monocytes Absolute: 0.9 10*3/uL (ref 0.1–1.0)
Monocytes Relative: 11 %
Neutro Abs: 5.3 10*3/uL (ref 1.7–7.7)
Neutrophils Relative %: 64 %
Platelets: 318 10*3/uL (ref 150–400)
RBC: 3.51 MIL/uL — ABNORMAL LOW (ref 3.87–5.11)
RDW: 14.3 % (ref 11.5–15.5)
WBC: 8.4 10*3/uL (ref 4.0–10.5)
nRBC: 0 % (ref 0.0–0.2)

## 2023-03-25 LAB — BASIC METABOLIC PANEL
Anion gap: 13 (ref 5–15)
BUN: 16 mg/dL (ref 8–23)
CO2: 21 mmol/L — ABNORMAL LOW (ref 22–32)
Calcium: 8.6 mg/dL — ABNORMAL LOW (ref 8.9–10.3)
Chloride: 102 mmol/L (ref 98–111)
Creatinine, Ser: 0.83 mg/dL (ref 0.44–1.00)
GFR, Estimated: >60 mL/min (ref >60)
Glucose, Bld: 110 mg/dL — ABNORMAL HIGH (ref 70–99)
Potassium: 3.8 mmol/L (ref 3.5–5.1)
Sodium: 136 mmol/L (ref 135–145)

## 2023-03-25 LAB — AEROBIC/ANAEROBIC CULTURE W GRAM STAIN (SURGICAL/DEEP WOUND)

## 2023-03-25 MED ORDER — SODIUM CHLORIDE 0.9 % IV SOLN
8.0000 mg/kg | Freq: Every day | INTRAVENOUS | Status: DC
  Administered 2023-03-25 – 2023-03-26 (×2): 600 mg via INTRAVENOUS
  Filled 2023-03-25 (×3): qty 12

## 2023-03-25 NOTE — Progress Notes (Signed)
[facility] Progress Note Patient: Gray Habibi ZOX:[id number] DOB: 1945-12-23 DOA: 03/16/2023  DOS: the patient was seen and examined on 03/25/2023  Brief hospital course: PMH of dementia, depression, OSA not on CPAP, H. pylori infection, bilateral TKR.  Present to the hospital with complaints of right knee pain and swelling.  Found to have prosthetic joint infection as well as possible pseudogout. Orthopedic consulted ID consulted. Underwent excisional debridement of right knee. Now has a PICC line with plans for antibiotics for 6 weeks. Assessment and Plan: Right knee prosthetic joint infection. Presents with right knee pain and swelling. CT shows evidence of suprapatellar effusion. Underwent arthrocentesis by IR. Synovial fluid analysis concerning for prosthetic joint infection. There is also evidence of intracellular calcium pyrophosphate crystals. Orthopedic were consulted. Underwent I&D and polyethylene exchange which was done on 5/13. Currently orthopedic recommending IV antibiotics for 6 weeks. PICC line is placed. Will arrange SNF for antibiotic.  Calcium pyrophosphate deposition disease. Centimeters fluid analysis as well as IND concerning for calcium pyrophosphate crystal deposition. Treated with IV Solu-Medrol. Currently pain improving. Management per orthopedics.  Concern for dysphagia. Speech therapy ordered to the patient. Recommended regular diet consistency for now.  Dementia. Patient appears to be drowsy at the time of my evaluation without receiving any narcotics. Was able to work with therapy later. Monitor for now. Continue delirium precaution.  Reported history of OSA. No further workup for now. Not on CPAP now.   Subjective: No nausea or vomiting.  Drowsy.  No acute complaint.  No acute event overnight.  Physical Exam: General: in Mild distress, No Rash Cardiovascular: S1 and S2 Present, No Murmur Respiratory: Good respiratory effort, Bilateral  Air entry present. No Crackles, No wheezes Abdomen: Bowel Sound present, No tenderness Extremities: Trace right leg edema Neuro: Drowsy and oriented x3, no new focal deficit  Data Reviewed: I have Reviewed nursing notes, Vitals, and Lab results. Since last encounter, pertinent lab results CBC and BMP   . I have ordered test including CBC and BMP  .  I have this patient's case with ID.  Disposition: Status is: Inpatient Remains inpatient appropriate because: Need for IV antibiotics and arrangement of SNF  SCDs Start: 03/21/23 0057   Family Communication: Attempted to discuss with daughter on the phone, will attempt again tomorrow. Level of care: Med-Surg   Vitals:   03/24/23 0729 03/24/23 1429 03/25/23 0826 03/25/23 1442  BP: (!) 158/82 (!) 158/64 138/73 (!) 112/58  Pulse: 89 88 81 86  Resp:   16 16  Temp: 97.8 F (36.6 C) 98.3 F (36.8 C) 98.4 F (36.9 C) 98.3 F (36.8 C)  TempSrc: Oral Oral Oral Oral  SpO2: 91% 98% 98% 98%  Weight:         Author: Lynden Oxford, MD 03/25/2023 4:21 PM  Please look on [email or web address] to find out who is on call.

## 2023-03-25 NOTE — Progress Notes (Signed)
[facility]  Date of Admission:  03/16/2023     Principal Problem:   Knee effusion, right Active Problems:   Dementia without behavioral disturbance (HCC)   OSA (obstructive sleep apnea)   Infection of prosthetic right knee joint [facility])          Assessment: 77 year old female: #Concern for right knee PJI status post excisional and last excisional debridement and polyethylene exchange on 5/13 - Patient initially had right TKA 15 years ago in [place].  History of advanced dementia lives at home with family, per documentation family provided history.  Complaining of right knee pain which led to admission 03/16/2023.  Patient afebrile and without leukocytosis on admission. Underwent IR guided arthrocentesis which showed 1100 WBCs 92% neutrophils and crystals.  Ortho was engaged, given provide family history of right knee pain aspiration findings there was high concern for infection.  Patient taken to the OR. -Aspirate Cx(5/10) NG and or cultures(5/13) with no growth x 2 days - PICC in place - Reviewed aspirate cell count and overall findings with Ortho.  We discussed that pseudogout without infection is rare in the setting of prosthetic joint.  Will treat with 6 weeks of IV antibiotics Recommendations:  - Continue daptomycin and ceftriaxone x 6 weeks from the OR on 5/13 -Follow-up or cultures - Follow-up with ID outpatient - Continue to follow-up with Ortho - ID will sign off   #Advanced dementia - Unable to provide history, will discuss with team as above   OPAT ORDERS:  Diagnosis: RPJI  Culture Result: NG  No Known Allergies   Discharge antibiotics to be given via PICC line:  Per pharmacy protocol dapto 8 mg/kg/day, cts 2gm per day    Duration: 6 weeks End Date: 05/04/23  [facility] Care Per Protocol with [employer] Use: Home health RN for IV administration and teaching, line care and labs.    Labs weekly while on IV  antibiotics: _x_ CBC with differential __ BMP **TWICE WEEKLY ON VANCOMYCIN  _x_ CMP _x_ CRP xx__ ESR __ Vancomycin trough TWICE WEEKLY __ CK  x__ Please pull PIC at completion of IV antibiotics __ Please leave PIC in place until doctor has seen patient or been notified  Fax weekly labs to [phone]  Clinic Follow Up Appt: 6/4  @ [facility] with Dr. Thedore Mins  Microbiology:   Antibiotics: Vancomycin 5/10-present Ceftriaxone 5/10-present Cefazolin 5/13   Cultures:   Other 5/10 Synovial cell count with 09[birth year]1949 WBC, 92% neutrophils, 6% lymphocytes, 2% monocytes, intracellular calcium pyrophosphate crystals noted. CX with no growth x 4 days   SUBJECTIVE: Resting in bed, no new complaints. Wbc 12.2 K.  Interval: Afebrile overnight.   Review of Systems: Review of Systems  All other systems reviewed and are negative.    Scheduled Meds:  aspirin  81 mg Oral BID   Chlorhexidine Gluconate Cloth  6 each Topical Q0600   lidocaine  1 patch Transdermal Q24H   mupirocin ointment  1 Application Nasal BID   senna-docusate  1 tablet Oral BID   sodium chloride flush  10-40 mL Intracatheter Q12H   Continuous Infusions:  cefTRIAXone (ROCEPHIN)  IV 2 g (03/25/23 0110)   DAPTOmycin (CUBICIN) 600 mg in sodium chloride 0.9 % IVPB     PRN Meds:.acetaminophen **OR** acetaminophen, hydrALAZINE, sodium chloride flush No Known Allergies  OBJECTIVE: Vitals:   03/24/23 0410 03/24/23 0729 03/24/23 1429 03/25/23 0826  BP: (!) 171/89 (!) 158/82 (!) 158/64  138/73  Pulse: 78 89 88 81  Resp: 18   16  Temp: 98 F (36.7 C) 97.8 F (36.6 C) 98.3 F (36.8 C) 98.4 F (36.9 C)  TempSrc: Oral Oral Oral Oral  SpO2: 99% 91% 98% 98%  Weight:       Body mass index is 24.47 kg/m.  Physical Exam Constitutional:      Appearance: Normal appearance.  HENT:     Head: Normocephalic and atraumatic.     Right Ear: Tympanic membrane normal.     Left Ear: Tympanic membrane normal.     Nose: Nose  normal.     Mouth/Throat:     Mouth: Mucous membranes are moist.  Eyes:     Extraocular Movements: Extraocular movements intact.     Conjunctiva/sclera: Conjunctivae normal.     Pupils: Pupils are equal, round, and reactive to light.  Cardiovascular:     Rate and Rhythm: Normal rate and regular rhythm.     Heart sounds: No murmur heard.    No friction rub. No gallop.  Pulmonary:     Effort: Pulmonary effort is normal.     Breath sounds: Normal breath sounds.  Abdominal:     General: Abdomen is flat.     Palpations: Abdomen is soft.  Musculoskeletal:     Comments: Right leg bandaged  Skin:    General: Skin is warm and dry.  Neurological:     General: No focal deficit present.     Mental Status: She is alert and oriented to person, place, and time.  Psychiatric:        Mood and Affect: Mood normal.       Lab Results Lab Results  Component Value Date   WBC 8.4 03/25/2023   HGB 10.3 (L) 03/25/2023   HCT 31.7 (L) 03/25/2023   MCV 90.3 03/25/2023   PLT 318 03/25/2023    Lab Results  Component Value Date   CREATININE 0.83 03/25/2023   BUN 16 03/25/2023   NA 136 03/25/2023   K 3.8 03/25/2023   CL 102 03/25/2023   CO2 21 (L) 03/25/2023    Lab Results  Component Value Date   ALT 11 03/16/2023   AST 22 03/16/2023   ALKPHOS 91 03/16/2023   BILITOT 1.5 (H) 03/16/2023        Danelle Earthly, MD [facility] 03/25/2023, 1:40 PM   I have personally spent 55 minutes involved in face-to-face and non-face-to-face activities for this patient on the day of the visit. Professional time spent includes the following activities: Preparing to see the patient (review of tests), Obtaining and/or reviewing separately obtained history (admission/discharge record), Performing a medically appropriate examination and/or evaluation , Ordering medications/tests/procedures, referring and communicating with other health care professionals,  Documenting clinical information in the EMR, Independently interpreting results (not separately reported), Communicating results to the patient/family/caregiver, Counseling and educating the patient/family/caregiver and Care coordination (not separately reported).

## 2023-03-25 NOTE — Hospital Course (Signed)
PMH of dementia, depression, OSA not on CPAP, H. pylori infection, bilateral TKR.  Present to the hospital with complaints of right knee pain and swelling.  Found to have prosthetic joint infection as well as possible pseudogout. Orthopedic consulted ID consulted. Underwent excisional debridement of right knee. Now has a PICC line with plans for antibiotics for 6 weeks. Awaiting insurance authorization.  Medically stable.

## 2023-03-25 NOTE — Plan of Care (Signed)
  Problem: Clinical Measurements: Goal: Ability to maintain clinical measurements within normal limits will improve Outcome: Progressing   Problem: Safety: Goal: Ability to remain free from injury will improve Outcome: Progressing   Problem: Skin Integrity: Goal: Risk for impaired skin integrity will decrease Outcome: Progressing   

## 2023-03-25 NOTE — TOC Progression Note (Signed)
Transition of Care [facility]) - Progression Note    Patient Details  Name: Monica Erickson MRN: [id number] Date of Birth: 04/01/1946  Transition of Care [facility]) CM/SW Contact  Lorri Frederick, LCSW Phone Number: 03/25/2023, 3:09 PM  Clinical Narrative:    Pt oriented x1.   CSW attempted to call pt daughter [name], no answer, left message. Pt now requiring IV abx x6 weeks.  CSW spoke with [facility], who can still accept pt if Berkley Harvey is approved.  Auth request submitted in [place] for [place].  CSW will await return call from pt daughter to confirm DC plan.     Expected Discharge Plan: Skilled Nursing Facility Barriers to Discharge: ED SNF auth  Expected Discharge Plan and Services                                               Social Determinants of Health (SDOH) Interventions SDOH Screenings   Depression (PHQ2-9): Low Risk  (12/23/2021)  Tobacco Use: Medium Risk (03/25/2023)    Readmission Risk Interventions     No data to display

## 2023-03-25 NOTE — Progress Notes (Signed)
PHARMACY CONSULT NOTE FOR:  OUTPATIENT  PARENTERAL ANTIBIOTIC THERAPY (OPAT)  Indication:PJI of right knee  Regimen: Daptomycin 600 mg every 24 hours + Ceftriaxone 2 gm IV Q 24 hours  End date: 05/04/23  IV antibiotic discharge orders are pended. To discharging provider:  please sign these orders via discharge navigator,  Select New Orders & click on the button choice - Manage This Unsigned Work.     Thank you for allowing pharmacy to be a part of this patient's care.  Sharin Mons, PharmD, BCPS, BCIDP Infectious Diseases Clinical Pharmacist Phone: [phone] 03/25/2023, 10:10 AM

## 2023-03-25 NOTE — Progress Notes (Signed)
Physical Therapy Treatment Patient Details Name: Monica Erickson MRN: [id number] DOB: [birth year] Today's Date: 03/25/2023   History of Present Illness Patient is a 77 year old female with a history of dementia.  Patient complains of diffuse pain and cared for by her daughter at baseline. 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    PT Comments    Patient continues to have difficulty following commands. She is resistive at times with mobility and during lower extremity exercise attempts. Only able to achieve partial sitting position on edge of bed before patient becomes resistive. PT will continue to follow in attempts to maximize independence and decrease caregiver burden. Anticipate the need for frequent/constant assistance at discharge.    Recommendations for follow up therapy are one component of a multi-disciplinary discharge planning process, led by the attending physician.  Recommendations may be updated based on patient status, additional functional criteria and insurance authorization.  Follow Up Recommendations       Assistance Recommended at Discharge Frequent or constant Supervision/Assistance  Patient can return home with the following Two people to help with walking and/or transfers;Two people to help with bathing/dressing/bathroom;Assistance with cooking/housework;Assistance with feeding;Direct supervision/assist for medications management;Direct supervision/assist for financial management;Assist for transportation;Help with stairs or ramp for entrance   Equipment Recommendations  Other (comment);Hospital bed;Wheelchair cushion (measurements PT);Wheelchair (measurements PT) (hoyer lift)    Recommendations for Other Services       Precautions / Restrictions Precautions Precautions: Fall;Knee Precaution Booklet Issued: No Restrictions Weight Bearing Restrictions: Yes RLE Weight Bearing: Weight bearing as  tolerated     Mobility  Bed Mobility Overal bed mobility: Needs Assistance Bed Mobility: Supine to Sit, Sit to Supine     Supine to sit: Total assist Sit to supine: Max assist   General bed mobility comments: Patient initially agreeable and participates with initiating trunk movement when asked to sit up on edge of bed. She is then resistive and required total assistance to achieve partial sitting position with feet off the side of the bed. Distraction, encouragement, and education provided. Patient does participate with long sitting and is able to pull herself up to sitting using bilateral bed rails with moderate assistance for trunk support    Transfers                   General transfer comment: unable to attempt    Ambulation/Gait                   Stairs             Wheelchair Mobility    Modified Rankin (Stroke Patients Only)       Balance                                            Cognition Arousal/Alertness: Awake/alert Behavior During Therapy: WFL for tasks assessed/performed, Flat affect Overall Cognitive Status: No family/caregiver present to determine baseline cognitive functioning                                 General Comments: history of cognitive impairments at baseline. Patient is able to state her first name only. Does not respond when asked about time, place, sitiuation. Unable to state her birthdate. Difficulty following single step  commands        Exercises Total Joint Exercises Ankle Circles/Pumps: AAROM, Strengthening, Both, 10 reps, Supine Heel Slides: AAROM, Strengthening, Left, 5 reps, Supine Hip ABduction/ADduction: AAROM, Strengthening, Right, 5 reps, Supine Other Exercises Other Exercises: R knee lacking ~ 15-20 degrees to full knee extension on the right. patient is resistive to exercise after around 5 repetitions and is unable to follow commands for quad set to to faciliate knee  extension. LE positioned with towel roll under right heel to faciliate positioning/knee extension    General Comments General comments (skin integrity, edema, etc.): Noted patient has had knee surgery since PT evaluation. Currently care plan remains appropriate      Pertinent Vitals/Pain Pain Assessment Pain Assessment: Faces Faces Pain Scale: Hurts even more Pain Location: R knee with movement Pain Descriptors / Indicators: Grimacing, Moaning Pain Intervention(s): Limited activity within patient's tolerance, Monitored during session, Repositioned    Home Living                          Prior Function            PT Goals (current goals can now be found in the care plan section) Acute Rehab PT Goals Patient Stated Goal: none stated PT Goal Formulation: Patient unable to participate in goal setting Time For Goal Achievement: 03/31/23 Potential to Achieve Goals: Fair Progress towards PT goals: Not progressing toward goals - comment    Frequency    Min 2X/week      PT Plan Current plan remains appropriate    Co-evaluation              AM-PAC PT "6 Clicks" Mobility   Outcome Measure  Help needed turning from your back to your side while in a flat bed without using bedrails?: A Lot Help needed moving from lying on your back to sitting on the side of a flat bed without using bedrails?: Total Help needed moving to and from a bed to a chair (including a wheelchair)?: Total Help needed standing up from a chair using your arms (e.g., wheelchair or bedside chair)?: Total Help needed to walk in hospital room?: Total Help needed climbing 3-5 steps with a railing? : Total 6 Click Score: 7    End of Session   Activity Tolerance: Other (comment) (limited by cognition) Patient left: in bed;with call bell/phone within reach;with bed alarm set Nurse Communication: Mobility status PT Visit Diagnosis: Other abnormalities of gait and mobility (R26.89);Difficulty in  walking, not elsewhere classified (R26.2)     Time: 1210-1229 PT Time Calculation (min) (ACUTE ONLY): 19 min  Charges:  $Therapeutic Activity: 8-22 mins                     Monica Erickson, PT, MPT    Monica Erickson 03/25/2023, 1:46 PM

## 2023-03-26 DIAGNOSIS — M25461 Effusion, right knee: Secondary | ICD-10-CM | POA: Diagnosis not present

## 2023-03-26 LAB — CK: Total CK: 71 U/L (ref 38–234)

## 2023-03-26 MED ORDER — ENSURE ENLIVE PO LIQD
237.0000 mL | Freq: Three times a day (TID) | ORAL | Status: DC
  Administered 2023-03-26 – 2023-03-27 (×3): 237 mL via ORAL

## 2023-03-26 NOTE — Progress Notes (Signed)
[facility] Progress Note Patient: Monica Erickson ZOX:[id number] DOB: 1946-01-25 DOA: 03/16/2023  DOS: the patient was seen and examined on 03/26/2023  Brief hospital course: PMH of dementia, depression, OSA not on CPAP, H. pylori infection, bilateral TKR.  Present to the hospital with complaints of right knee pain and swelling.  Found to have prosthetic joint infection as well as possible pseudogout. Orthopedic consulted ID consulted. Underwent excisional debridement of right knee. Now has a PICC line with plans for antibiotics for 6 weeks. Awaiting insurance authorization.  Medically stable. Assessment and Plan: Right knee prosthetic joint infection. Presents with right knee pain and swelling. CT shows evidence of suprapatellar effusion. Underwent arthrocentesis by IR. Synovial fluid analysis concerning for prosthetic joint infection. There is also evidence of intracellular calcium pyrophosphate crystals. Orthopedic were consulted. Underwent I&D and polyethylene exchange which was done on 5/13. Currently orthopedic recommending IV antibiotics for 6 weeks. PICC line is placed. Will arrange SNF for antibiotic.  Currently medically stable.  Awaiting insurance authorization.  Calcium pyrophosphate deposition disease. Centimeters fluid analysis as well as IND concerning for calcium pyrophosphate crystal deposition. Treated with IV Solu-Medrol. Currently pain improving. Management per orthopedics.  Concern for dysphagia. Speech therapy ordered to the patient. Recommended regular diet consistency for now.  Dementia. Patient was more alert today.  Was not talking with me but answered RN's questions clearly within 5 minutes of my interview. Monitor for now. Continue delirium precaution.  Reported history of OSA. No further workup for now. Not on CPAP now.  Add Ensure for adequate nutrition.   Subjective: No acute event.  No acute complaint.  Pain well-controlled.  Oral intake  minimal but improving.  Physical Exam: In no distress.  No rash. Clear to auscultation. S1-S2 present. Bowel sound present. Alert and awake.  Oriented to self.  Able to follow commands.  Data Reviewed: I have Reviewed nursing notes, Vitals, and Lab results. Since last encounter, pertinent lab results CBC and BMP   .  Disposition: Status is: Inpatient Remains inpatient appropriate because: Medically stable.  Awaiting insurance approval. SCDs Start: 03/21/23 0057   Family Communication: No one at bedside Level of care: Med-Surg   Vitals:   03/25/23 1442 03/25/23 2015 03/26/23 0830 03/26/23 1325  BP: (!) 112/58 111/62 134/68 124/63  Pulse: 86 93 69 68  Resp: 16 16 18 18   Temp: 98.3 F (36.8 C) 98.2 F (36.8 C) 98 F (36.7 C) (!) 97.5 F (36.4 C)  TempSrc: Oral Axillary Oral Oral  SpO2: 98% 99% 90% 100%  Weight:         Author: Lynden Oxford, MD 03/26/2023 4:09 PM  Please look on [email or web address] to find out who is on call.

## 2023-03-26 NOTE — Plan of Care (Signed)

## 2023-03-26 NOTE — TOC Progression Note (Addendum)
Transition of Care [facility]) - Progression Note    Patient Details  Name: Monica Erickson MRN: [id number] Date of Birth: 23-Mar-1946  Transition of Care [facility]) CM/SW Contact  Monica Frederick, LCSW Phone Number: 03/26/2023, 10:07 AM  Clinical Narrative:    CSW received message from [place].  SNF auth request automatically goes to medical director due to recent denial.  They will be in touch once it is reviewed.  1000: CSW attempted to contact daughter Monica Erickson, unable to leave message, did send text.  CSW was able to speak with pt brother Monica Erickson.  He will attempt to call Monica Erickson and ask her to call.  1345: more attempts to contact daughter Monica Erickson, brother Monica Erickson.  Last 2 listed contacts do not have valid phone numbers listed  1500: TC with daughter Monica Erickson.  She does want to attempt placement at [facility] if it can be authorized.  Reports that pt can come back home as soon as she can mobilize enough to get to the bathroom.  Auth remains pending.  Yesterday PT note uploaded.     Expected Discharge Plan: Skilled Nursing Facility Barriers to Discharge: ED SNF auth  Expected Discharge Plan and Services                                               Social Determinants of Health (SDOH) Interventions SDOH Screenings   Depression (PHQ2-9): Low Risk  (12/23/2021)  Tobacco Use: Medium Risk (03/25/2023)    Readmission Risk Interventions     No data to display

## 2023-03-26 NOTE — Care Management Important Message (Signed)
Important Message  Patient Details  Name: Monica Erickson MRN: [id number] Date of Birth: 09-06-1946   Medicare Important Message Given:  Yes     Sherilyn Banker 03/26/2023, 2:08 PM

## 2023-03-27 DIAGNOSIS — M25461 Effusion, right knee: Secondary | ICD-10-CM | POA: Diagnosis not present

## 2023-03-27 LAB — AEROBIC/ANAEROBIC CULTURE W GRAM STAIN (SURGICAL/DEEP WOUND)

## 2023-03-27 MED ORDER — LIDOCAINE 5 % EX PTCH: 1.0000 | MEDICATED_PATCH | CUTANEOUS | 0 refills | Status: DC

## 2023-03-27 MED ORDER — ACETAMINOPHEN 500 MG PO TABS: 500.0000 mg | ORAL_TABLET | Freq: Two times a day (BID) | ORAL | 0 refills | Status: DC

## 2023-03-27 MED ORDER — ACETAMINOPHEN 500 MG PO TABS
1000.0000 mg | ORAL_TABLET | Freq: Two times a day (BID) | ORAL | Status: DC
  Administered 2023-03-27: 1000 mg via ORAL
  Filled 2023-03-27: qty 2

## 2023-03-27 MED ORDER — DOCUSATE SODIUM 100 MG PO CAPS: 100.0000 mg | ORAL_CAPSULE | Freq: Two times a day (BID) | ORAL | 0 refills | Status: DC

## 2023-03-27 MED ORDER — DAPTOMYCIN IV (FOR PTA / DISCHARGE USE ONLY): 600.0000 mg | INTRAVENOUS | 0 refills | Status: AC

## 2023-03-27 MED ORDER — DIAZEPAM 2 MG PO TABS: 2.0000 mg | ORAL_TABLET | Freq: Two times a day (BID) | ORAL | 0 refills | Status: DC | PRN

## 2023-03-27 MED ORDER — ASPIRIN 81 MG PO TBEC: 81.0000 mg | DELAYED_RELEASE_TABLET | Freq: Two times a day (BID) | ORAL | 0 refills | Status: AC

## 2023-03-27 MED ORDER — CEFTRIAXONE IV (FOR PTA / DISCHARGE USE ONLY)
2.0000 g | INTRAVENOUS | 0 refills | Status: AC
Start: 2023-03-27 — End: 2023-05-06

## 2023-03-27 MED ORDER — ACETAMINOPHEN 325 MG PO TABS: 650.0000 mg | ORAL_TABLET | Freq: Four times a day (QID) | ORAL | Status: DC | PRN

## 2023-03-27 MED ORDER — ENSURE ENLIVE PO LIQD: 237.0000 mL | Freq: Three times a day (TID) | ORAL | 12 refills | Status: DC

## 2023-03-27 NOTE — Progress Notes (Signed)
Patient discharged, report called and given to the nurse Edd Fabian, at [facility]. No additional questions and or concerns at this time. Patient PIV removed per protocol, patient still has PICC in place for long term antibiotic.  Patient discharge packet and paperwork sent with PTAR to facility.

## 2023-03-27 NOTE — Progress Notes (Signed)
Occupational Therapy Treatment Patient Details Name: Monica Erickson MRN: [id number] DOB: Jul 17, 1946 Today's Date: 03/27/2023   History of present illness Patient is a 77 year old female with a history of dementia.  Patient complains of diffuse pain and cared for by her daughter at baseline. 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   OT comments  Pt making slow progress with functional goals. Pt recently worked with PT this a.m.  and pt is limited by fatigue and cognition. Pt required max A to sit EOB and min guard A - Sup for balance seated EOB for simple grooming and UB dressing tasks. Pt required mod A for sit - stand with RW and SPT to [facility] and back to bed. OT will continue to follow acutely to maximize level of function and safety   Recommendations for follow up therapy are one component of a multi-disciplinary discharge planning process, led by the attending physician.  Recommendations may be updated based on patient status, additional functional criteria and insurance authorization.    Assistance Recommended at Discharge Frequent or constant Supervision/Assistance  Patient can return home with the following  Help with stairs or ramp for entrance;Assist for transportation;Assistance with cooking/housework;A lot of help with bathing/dressing/bathroom;A lot of help with walking and/or transfers   Equipment Recommendations  Other (comment) (TBD at SNF)    Recommendations for Other Services      Precautions / Restrictions Precautions Precautions: Fall;Knee Precaution Booklet Issued: No Restrictions Weight Bearing Restrictions: Yes RLE Weight Bearing: Weight bearing as tolerated       Mobility Bed Mobility Overal bed mobility: Needs Assistance Bed Mobility: Supine to Sit, Sit to Supine     Supine to sit: Max assist Sit to supine: Max assist   General bed mobility comments: max A with LE mgt and trunk elevation.  total A using bed pad to scoot hips forward    Transfers Overall transfer level: Needs assistance Equipment used: Rolling walker (2 wheels) Transfers: Sit to/from Stand Sit to Stand: Mod assist           General transfer comment: Pt stood EOB ~ 45 seconds with mod A for balance     Balance Overall balance assessment: Needs assistance Sitting-balance support: Bilateral upper extremity supported, Feet supported Sitting balance-Leahy Scale: Fair Sitting balance - Comments: sitting EOB with initial Min guard A to maintain balance progressing to supervision with anterior lean.   Standing balance support: Bilateral upper extremity supported, During functional activity, Reliant on assistive device for balance Standing balance-Leahy Scale: Poor                             ADL either performed or assessed with clinical judgement   ADL Overall ADL's : Needs assistance/impaired     Grooming: Wash/dry hands;Wash/dry face;Minimal assistance;Sitting           Upper Body Dressing : Minimal assistance;Sitting       Toilet Transfer: Moderate assistance;Rolling walker (2 wheels);Cueing for safety;Cueing for sequencing   Toileting- Clothing Manipulation and Hygiene: Total assistance;Sit to/from stand       Functional mobility during ADLs: Moderate assistance      Extremity/Trunk Assessment Upper Extremity Assessment Upper Extremity Assessment: Generalized weakness   Lower Extremity Assessment Lower Extremity Assessment: Defer to PT evaluation   Cervical / Trunk Assessment Cervical / Trunk Assessment: Kyphotic    Vision Ability to See in Adequate Light:  0 Adequate Patient Visual Report: No change from baseline     Perception     Praxis      Cognition Arousal/Alertness: Awake/alert Behavior During Therapy: WFL for tasks assessed/performed, Flat affect Overall Cognitive Status: History of cognitive impairments - at baseline Area of Impairment: Following  commands, Problem solving, Safety/judgement                       Following Commands: Follows one step commands with increased time     Problem Solving: Slow processing, Decreased initiation, Requires verbal cues, Requires tactile cues          Exercises      Shoulder Instructions       General Comments      Pertinent Vitals/ Pain       Pain Assessment Pain Assessment: Faces Faces Pain Scale: Hurts even more Pain Location: R LE knee with movement Pain Descriptors / Indicators: Grimacing, Moaning, Guarding Pain Intervention(s): Limited activity within patient's tolerance, Monitored during session, Repositioned  Home Living                                          Prior Functioning/Environment              Frequency  Min 2X/week        Progress Toward Goals  OT Goals(current goals can now be found in the care plan section)  Progress towards OT goals: Progressing toward goals     Plan Discharge plan remains appropriate;Frequency remains appropriate    Co-evaluation                 AM-PAC OT "6 Clicks" Daily Activity     Outcome Measure   Help from another person eating meals?: A Little Help from another person taking care of personal grooming?: A Little Help from another person toileting, which includes using toliet, bedpan, or urinal?: Total Help from another person bathing (including washing, rinsing, drying)?: A Lot Help from another person to put on and taking off regular upper body clothing?: A Little Help from another person to put on and taking off regular lower body clothing?: Total 6 Click Score: 13    End of Session Equipment Utilized During Treatment: Gait belt;Rolling walker (2 wheels)  OT Visit Diagnosis: Pain;Other abnormalities of gait and mobility (R26.89);Muscle weakness (generalized) (M62.81) Pain - Right/Left: Right Pain - part of body: Knee;Leg   Activity Tolerance Patient limited by  fatigue;Patient limited by pain;Other (comment) (limited by cognition)   Patient Left in bed;with call bell/phone within reach;with bed alarm set   Nurse Communication          Time: 5409-8119 OT Time Calculation (min): 19 min  Charges: OT General Charges $OT Visit: 1 Visit    Galen Manila 03/27/2023, 12:30 PM

## 2023-03-27 NOTE — TOC Transition Note (Signed)
Transition of Care [facility]) - CM/SW Discharge Note   Patient Details  Name: Tangla Morosky MRN: [id number] Date of Birth: January 30, 1946  [facility]) CM/SW Contact:  Lorri Frederick, LCSW Phone Number: 03/27/2023, 10:15 AM   Clinical Narrative:   Pt discharging to [facility], room 312.  RN call report to [phone].     Final next level of care: Skilled Nursing Facility Barriers to Discharge: Barriers Resolved   Patient Goals and CMS Choice      Discharge Placement                Patient chooses bed at: [facility] Patient to be transferred to facility by: PTAR Name of family member notified: daughter [name] Patient and family notified of of transfer: 03/27/23  Discharge Plan and Services Additional resources added to the After Visit Summary for                                       Social Determinants of Health (SDOH) Interventions SDOH Screenings   Depression (PHQ2-9): Low Risk  (12/23/2021)  Tobacco Use: Medium Risk (03/25/2023)     Readmission Risk Interventions     No data to display

## 2023-03-27 NOTE — TOC Progression Note (Addendum)
Transition of Care [facility]) - Progression Note    Patient Details  Name: Monica Erickson MRN: [id number] Date of Birth: Jun 19, 1946  [facility]) CM/SW Contact  Lorri Frederick, LCSW Phone Number: 03/27/2023, 8:16 AM  Clinical Narrative:  SNF auth approved: [id number], [id number], 5 days: 5/16-5/20.  MD notified.  CSW confirmed with [facility] that they can receive pt today.  They need help contacting daughter [name] for paperwork.   CSW reached out to daughter by text.   0930-TC with daughter [name].  She is in town, will call Sonny Dandy now.    Expected Discharge Plan: Skilled Nursing Facility Barriers to Discharge: ED SNF auth  Expected Discharge Plan and Services                                               Social Determinants of Health (SDOH) Interventions SDOH Screenings   Depression (PHQ2-9): Low Risk  (12/23/2021)  Tobacco Use: Medium Risk (03/25/2023)    Readmission Risk Interventions     No data to display

## 2023-03-27 NOTE — Plan of Care (Signed)

## 2023-03-27 NOTE — Progress Notes (Signed)
Physical Therapy Treatment Patient Details Name: Monica Erickson MRN: [id number] DOB: June 08, 1946 Today's Date: 03/27/2023   History of Present Illness Patient is a 77 year old female with a history of dementia.  Patient complains of diffuse pain and cared for by her daughter at baseline. 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    PT Comments    Pt received in supine and agreeable to session. Pt limited by cognition and limited ability to follow commands, however pt is able to move well when task self-initiated. When told to sit EOB, pt scooted towards HOB and required max A +2 to advance to EOB. Pt able to sit with supervision on EOB, but was unable to perform BLE exercises due to not following commands. Pt able to lateral scoot towards HOB to prepare to return to supine, but required max A +2 to return to supine. Pt propping herself up on elbows once laying down due to bed being flat. Pt requiring max A to roll for hygiene due to confusion and resisting movement despite saying "ok" when the task was explained to her and she was cued to assist. Pt continues to benefit from PT services to progress toward functional mobility goals.     Recommendations for follow up therapy are one component of a multi-disciplinary discharge planning process, led by the attending physician.  Recommendations may be updated based on patient status, additional functional criteria and insurance authorization.     Assistance Recommended at Discharge Frequent or constant Supervision/Assistance  Patient can return home with the following Two people to help with walking and/or transfers;Two people to help with bathing/dressing/bathroom;Assistance with cooking/housework;Assistance with feeding;Direct supervision/assist for medications management;Direct supervision/assist for financial management;Assist for transportation;Help with stairs or ramp for entrance    Equipment Recommendations  Other (comment);Hospital bed;Wheelchair cushion (measurements PT);Wheelchair (measurements PT)    Recommendations for Other Services       Precautions / Restrictions Precautions Precautions: Fall;Knee Precaution Booklet Issued: No Restrictions Weight Bearing Restrictions: Yes RLE Weight Bearing: Weight bearing as tolerated     Mobility  Bed Mobility Overal bed mobility: Needs Assistance Bed Mobility: Supine to Sit, Sit to Supine, Rolling Rolling: Max assist   Supine to sit: Max assist, +2 for physical assistance Sit to supine: Max assist, +2 for physical assistance   General bed mobility comments: Initially attempted to advance to the R side of the bed with pt resisting RLE movement. Then attmepted to advance to the L side of the bed with the pt initially assisting with LLE advancement to EOB, but requiring assist with RLE, trunk elevation, and scooting forward with the bedpad. Pt able to scoot towards the [facility] before laying down, but requiring encouragement and max A +2 for BLE and trunk descent to return to supine. Pt required max A for full roll onto side for hygiene due to pt's confusion and resistance of movement.    Transfers Overall transfer level: Needs assistance Equipment used: Rolling walker (2 wheels) Transfers: Sit to/from Stand Sit to Stand: Min assist, +2 physical assistance           General transfer comment: Pt able to stand briefly at EOB with min A +2 for power up and cues for initiation.    Ambulation/Gait               General Gait Details: unable       Balance Overall balance assessment: Needs assistance Sitting-balance support: Bilateral upper  extremity supported, Feet supported Sitting balance-Leahy Scale: Fair Sitting balance - Comments: sitting EOB with initial assist to maintain balance progressing to supervision with anterior lean.   Standing balance support: Bilateral upper extremity supported, During  functional activity, Reliant on assistive device for balance Standing balance-Leahy Scale: Poor Standing balance comment: with RW support and assist to maintain upright posture                            Cognition Arousal/Alertness: Awake/alert Behavior During Therapy: WFL for tasks assessed/performed, Flat affect Overall Cognitive Status: History of cognitive impairments - at baseline                                 General Comments: Pt demonstrating difficulty with initiation and command following. Pt able to perform mobility tasks better with increased time and general commands.        Exercises      General Comments        Pertinent Vitals/Pain Pain Assessment Pain Assessment: Faces Faces Pain Scale: Hurts even more Pain Location: R knee with movement Pain Descriptors / Indicators: Grimacing, Moaning Pain Intervention(s): Monitored during session, Repositioned, Limited activity within patient's tolerance     PT Goals (current goals can now be found in the care plan section) Acute Rehab PT Goals Patient Stated Goal: none stated PT Goal Formulation: Patient unable to participate in goal setting Time For Goal Achievement: 03/31/23 Potential to Achieve Goals: Fair Progress towards PT goals: Progressing toward goals    Frequency    Min 2X/week      PT Plan Current plan remains appropriate       AM-PAC PT "6 Clicks" Mobility   Outcome Measure  Help needed turning from your back to your side while in a flat bed without using bedrails?: A Lot Help needed moving from lying on your back to sitting on the side of a flat bed without using bedrails?: Total Help needed moving to and from a bed to a chair (including a wheelchair)?: Total Help needed standing up from a chair using your arms (e.g., wheelchair or bedside chair)?: A Lot Help needed to walk in hospital room?: Total Help needed climbing 3-5 steps with a railing? : Total 6 Click  Score: 8    End of Session Equipment Utilized During Treatment: Gait belt Activity Tolerance: Other (comment) (limited by cognition) Patient left: in bed;with call bell/phone within reach;with bed alarm set Nurse Communication: Mobility status PT Visit Diagnosis: Other abnormalities of gait and mobility (R26.89);Difficulty in walking, not elsewhere classified (R26.2)     Time: 9147-8295 PT Time Calculation (min) (ACUTE ONLY): 28 min  Charges:  $Therapeutic Activity: 23-37 mins                     Johny Shock, PTA Acute Rehabilitation Services Secure Chat Preferred  [phone]    Johny Shock 03/27/2023, 11:19 AM

## 2023-03-27 NOTE — Plan of Care (Signed)

## 2023-03-27 NOTE — Discharge Summary (Addendum)
Physician Discharge Summary   Patient: Monica Erickson MRN: [id number] DOB: 1946/11/02  Admit date:     03/16/2023  Discharge date: 03/27/23  Discharge Physician: Lynden Oxford  PCP: Nona Dell, NP  Recommendations at discharge:  Follow up with PCP and Orthopedics as recommended   Follow-up Information     Durene Romans, MD Follow up in 1 week(s).   Specialty: Orthopedic Surgery Why: For wound re-check Contact information: [address] [place] [address] [phone]         Nona Dell, NP. Schedule an appointment as soon as possible for a visit in 1 week(s).   Specialty: Nurse Practitioner Contact information: [address] [place] [address] [phone]         Danelle Earthly, MD. Go on 04/14/2023.   Specialty: Infectious Diseases Contact information: [address] [place] [address] [phone]                Discharge Diagnoses: Principal Problem:   Knee effusion, right Active Problems:   Dementia without behavioral disturbance (HCC)   OSA (obstructive sleep apnea)   Infection of prosthetic right knee joint [facility])  Hospital Course: PMH of dementia, depression, OSA not on CPAP, H. pylori infection, bilateral TKR.  Present to the hospital with complaints of right knee pain and swelling.  Found to have prosthetic joint infection as well as possible pseudogout. Orthopedic consulted ID consulted. Underwent excisional debridement of right knee. Now has a PICC line with plans for antibiotics for 6 weeks. Awaiting insurance authorization.  Medically stable. Assessment and Plan  Right knee prosthetic joint infection. Presents with right knee pain and swelling. CT shows evidence of suprapatellar effusion. Underwent arthrocentesis by IR. Synovial fluid analysis concerning for prosthetic joint infection. There is also evidence of intracellular calcium pyrophosphate  crystals. Orthopedic were consulted. Underwent I&D and polyethylene exchange which was done on 5/13. Currently orthopedic recommending IV antibiotics for 6 weeks. PICC line is placed. Currently on daptomycin at 124 mL/hr over 30 min and ceftriaxone 233mL/hr over 30 min  Will arrange SNF for antibiotic.     Calcium pyrophosphate deposition disease. Centimeters fluid analysis as well as IND concerning for calcium pyrophosphate crystal deposition. Treated with IV Solu-Medrol. Avoiding long term steroids in the setting of concern of infection.  Currently pain improving. Management per orthopedics.   Concern for dysphagia. Speech therapy ordered to the patient. Recommended regular diet consistency for now.   Dementia. Alert today.   Continue delirium precaution.   Reported history of OSA. No further workup for now. Not on CPAP now.   Add Ensure for adequate nutrition.   Pain control - [employer] Controlled Substance Reporting System database was reviewed. and patient was instructed, not to drive, operate heavy machinery, perform activities at heights, swimming or participation in water activities or provide baby-sitting services while on Pain, Sleep and Anxiety Medications; until their outpatient Physician has advised to do so again. Also recommended to not to take more than prescribed Pain, Sleep and Anxiety Medications.  Consultants:  Orthopedics  IR ID  Procedures performed:  PICC line placement Excisional and non-excisional debridement of right knee with polyethylene exchange.  Excisional debridement was carried out sharply with a knife over an 8 inch incision including skin, soft tissue, nonviable tissue, synovium and bone.   Non-excisional debridement consisted of 6 liters of normal saline solution with pulse lavage and one bottle of Prontosan antimicrobial solution.  DISCHARGE MEDICATION: Allergies as  of 03/27/2023   No Known Allergies      Medication List     TAKE  these medications    acetaminophen 500 MG tablet Commonly known as: TYLENOL Take 1 tablet (500 mg total) by mouth 2 (two) times daily.   acetaminophen 325 MG tablet Commonly known as: TYLENOL Take 2 tablets (650 mg total) by mouth every 6 (six) hours as needed for mild pain (or Fever >/= 101).   aspirin EC 81 MG tablet Take 1 tablet (81 mg total) by mouth in the morning and at bedtime for 28 days. Swallow whole.   cefTRIAXone  IVPB Commonly known as: ROCEPHIN Inject 2 g into the vein daily. Indication:  PJI of right knee  First Dose: Yes Last Day of Therapy:  05/04/23 Labs - Once weekly:  CBC/D and BMP, Labs - Every other week:  ESR and CRP Method of administration: IV Push Method of administration may be changed at the discretion of home infusion pharmacist based upon assessment of the patient and/or caregiver's ability to self-administer the medication ordered.   cyanocobalamin 1000 MCG/ML injection Commonly known as: VITAMIN B12 Inject 1,000 mcg into the muscle once a week.   daptomycin  IVPB Commonly known as: CUBICIN Inject 600 mg into the vein daily. Indication:  PJI of right knee First Dose: Yes Last Day of Therapy:  05/04/23 Labs - Once weekly:  CBC/D, BMP, and CPK Labs - Every other week:  ESR and CRP Method of administration: IV Push Method of administration may be changed at the discretion of home infusion pharmacist based upon assessment of the patient and/or caregiver's ability to self-administer the medication ordered.   diazepam 2 MG tablet Commonly known as: VALIUM Take 1 tablet (2 mg total) by mouth 2 (two) times daily as needed for anxiety. What changed: reasons to take this   docusate sodium 100 MG capsule Commonly known as: Colace Take 1 capsule (100 mg total) by mouth 2 (two) times daily.   Ergocalciferol 50 MCG (2000 UT) Tabs Take 2,000 Units by mouth daily at 12 noon.   feeding supplement Liqd Take 237 mLs by mouth 3 (three) times daily between  meals.   lidocaine 5 % Commonly known as: LIDODERM Place 1 patch onto the skin daily. Remove & Discard patch within 12 hours or as directed by MD               Discharge Care Instructions  (From admission, onward)           Start     Ordered   03/27/23 0000  Change dressing on IV access line weekly and PRN  (Home infusion instructions - [facility] )        03/27/23 0828   03/27/23 0000  Leave dressing on - Keep it clean, dry, and intact until clinic visit        03/27/23 0830           Disposition: SNF Diet recommendation: Dysphagia type 3 thin Liquid  Discharge Exam: Vitals:   03/26/23 1325 03/26/23 2105 03/27/23 0454 03/27/23 0748  BP: 124/63 138/75 (!) 151/63 (!) 140/79  Pulse: 68 89 75 69  Resp: 18 18 19 17   Temp: (!) 97.5 F (36.4 C) 98.7 F (37.1 C)  98.6 F (37 C)  TempSrc: Oral Oral    SpO2: 100% 100% 100% 100%  Weight:       General: Appear in mild distress; no visible Abnormal Neck Mass Or lumps, Conjunctiva normal  Cardiovascular: S1 and S2 Present, no Murmur, Respiratory: good respiratory effort, Bilateral Air entry present and CTA, no Crackles, no wheezes Abdomen: Bowel Sound present, Non tender  Extremities: no Pedal edema Neurology: alert and not oriented to time, place, and person  [facility] Weights   03/21/23 0137  Weight: 73 kg   Condition at discharge: stable  The results of significant diagnostics from this hospitalization (including imaging, microbiology, ancillary and laboratory) are listed below for reference.   Imaging Studies: [place] EKG SITE RITE  Result Date: 03/24/2023 If Site Rite image not attached, placement could not be confirmed due to current cardiac rhythm.  IR US DRAIN/INJ MAJOR JOINT/BURSA  Result Date: 03/20/2023 INDICATION: Concern for knee joint infection EXAM: Ultrasound-guided aspiration right knee joint MEDICATIONS: The patient is currently admitted to the hospital and receiving intravenous  antibiotics. The antibiotics were administered within an appropriate time frame prior to the initiation of the procedure. ANESTHESIA/SEDATION: Local analgesia COMPLICATIONS: None immediate. PROCEDURE: Informed written consent was obtained from the patient after a thorough discussion of the procedural risks, benefits and alternatives. All questions were addressed. Maximal Sterile Barrier Technique was utilized including caps, mask, sterile gowns, sterile gloves, sterile drape, hand hygiene and skin antiseptic. A timeout was performed prior to the initiation of the procedure. The patient was placed supine on the exam table. Ultrasound of the right knee demonstrated small to moderate suprapatellar effusion. Skin entry site was marked, and the overlying skin was prepped draped in the standard sterile fashion. Local analgesia was obtained with 1% lidocaine. Using ultrasound guidance, a 19 gauge Yueh catheter was advanced into the identified suprapatellar effusion. Subsequently, approximately 20 mL clear yellow synovial fluid was aspirated. A sample was sent to the lab for microbiology analysis. Postprocedure imaging demonstrated appropriate reduction in effusion volume without complicating feature. A clean dressing was placed. The patient tolerated the procedure well without immediate complication. IMPRESSION: Successful ultrasound-guided aspiration of right suprapatellar knee joint effusion yielding 20 mL of clear yellow synovial fluid. Sample sent for microbiology analysis. Electronically Signed   By: Olive Bass M.D.   On: 03/20/2023 11:39   VAS [place] LOWER EXTREMITY VENOUS (DVT) (ONLY [facility] & [facility])  Result Date: 03/17/2023  Lower Venous DVT Study Patient Name:  DARIONA KREIN  Date of Exam:   03/17/2023 Medical Rec #: [id number]    Accession #:    [id number] Date of Birth: [birth year]     Patient Gender: F Patient Age:   36 years Exam Location:  [facility] Procedure:      VAS [place] LOWER EXTREMITY VENOUS (DVT) Referring  Phys: Alvira Monday --------------------------------------------------------------------------------  Indications: Pain. Other Indications: History of PE - chronic appearing emboli documented 2023. Limitations: Patient guarding, altered mental status. Comparison Study: 02-24-2023 Prior bilateral lower extremity venous study was                   negative for DVT. Performing Technologist: Jean Rosenthal RDMS, RVT  Examination Guidelines: A complete evaluation includes B-mode imaging, spectral Doppler, color Doppler, and power Doppler as needed of all accessible portions of each vessel. Bilateral testing is considered an integral part of a complete examination. Limited examinations for reoccurring indications may be performed as noted. The reflux portion of the exam is performed with the patient in reverse Trendelenburg.  +---------+---------------+---------+-----------+----------+--------------+ RIGHT    CompressibilityPhasicitySpontaneityPropertiesThrombus Aging +---------+---------------+---------+-----------+----------+--------------+ CFV      Full           Yes      Yes                                 +---------+---------------+---------+-----------+----------+--------------+  SFJ      Full                                                        +---------+---------------+---------+-----------+----------+--------------+ FV Prox  Full                                                        +---------+---------------+---------+-----------+----------+--------------+ FV Mid   Full                                                        +---------+---------------+---------+-----------+----------+--------------+ FV DistalFull                                                        +---------+---------------+---------+-----------+----------+--------------+ PFV      Full                                                         +---------+---------------+---------+-----------+----------+--------------+ POP      Full           Yes      Yes                                 +---------+---------------+---------+-----------+----------+--------------+ PTV      Full                                                        +---------+---------------+---------+-----------+----------+--------------+ PERO     Full                                                        +---------+---------------+---------+-----------+----------+--------------+   +---------+---------------+---------+-----------+----------+-------------------+ LEFT     CompressibilityPhasicitySpontaneityPropertiesThrombus Aging      +---------+---------------+---------+-----------+----------+-------------------+ CFV                                                   Unable to  visualize- patient                                                        guarding/grabbing   +---------+---------------+---------+-----------+----------+-------------------+ SFJ                                                   Unable to                                                                 visualize- patient                                                        guarding/grabbing   +---------+---------------+---------+-----------+----------+-------------------+ FV Prox  Full                                                             +---------+---------------+---------+-----------+----------+-------------------+ FV Mid   Full                                                             +---------+---------------+---------+-----------+----------+-------------------+ FV DistalFull                                                             +---------+---------------+---------+-----------+----------+-------------------+ PFV      Full                                                              +---------+---------------+---------+-----------+----------+-------------------+ POP      Full           Yes      Yes                                      +---------+---------------+---------+-----------+----------+-------------------+ PTV      Full                                                             +---------+---------------+---------+-----------+----------+-------------------+  PERO     Full                                                             +---------+---------------+---------+-----------+----------+-------------------+     Summary: RIGHT: - There is no evidence of deep vein thrombosis in the lower extremity.  - No cystic structure found in the popliteal fossa.  LEFT: - There is no evidence of deep vein thrombosis in the lower extremity. However, portions of this examination were limited- see technologist comments above.  - No cystic structure found in the popliteal fossa.  *See table(s) above for measurements and observations. Electronically signed by Lemar Livings MD on 03/17/2023 at 5:53:34 PM.    Final    CT Head Wo Contrast  Result Date: 03/17/2023 CLINICAL DATA:  Altered mental status EXAM: CT HEAD WITHOUT CONTRAST TECHNIQUE: Contiguous axial images were obtained from the base of the skull through the vertex without intravenous contrast. RADIATION DOSE REDUCTION: This exam was performed according to the departmental dose-optimization program which includes automated exposure control, adjustment of the mA and/or kV according to patient size and/or use of iterative reconstruction technique. COMPARISON:  02/04/2023 FINDINGS: Brain: There is no mass, hemorrhage or extra-axial collection. There is generalized atrophy without lobar predilection. Hypodensity of the white matter is most commonly associated with chronic microvascular disease. Bilateral basal ganglia mineralization Vascular: No abnormal hyperdensity of the major  intracranial arteries or dural venous sinuses. No intracranial atherosclerosis. Skull: The visualized skull base, calvarium and extracranial soft tissues are normal. Sinuses/Orbits: No fluid levels or advanced mucosal thickening of the visualized paranasal sinuses. No mastoid or middle ear effusion. The orbits are normal. IMPRESSION: 1. No acute intracranial abnormality. 2. Generalized atrophy and findings of chronic microvascular disease. Electronically Signed   By: Deatra Robinson M.D.   On: 03/17/2023 01:48   CT Knee Right Wo Contrast  Result Date: 03/16/2023 CLINICAL DATA:  Knee trauma. EXAM: CT OF THE RIGHT KNEE WITHOUT CONTRAST TECHNIQUE: Multidetector CT imaging of the right knee was performed according to the standard protocol. Multiplanar CT image reconstructions were also generated. RADIATION DOSE REDUCTION: This exam was performed according to the departmental dose-optimization program which includes automated exposure control, adjustment of the mA and/or kV according to patient size and/or use of iterative reconstruction technique. COMPARISON:  Right knee radiograph dated 03/16/2023. FINDINGS: Evaluation is limited due to streak artifact caused by arthroplasty. Bones/Joint/Cartilage There is a total knee arthroplasty. The arthroplasty components appear intact and in anatomic alignment. No acute fracture. Evaluation for fracture is limited due to streak artifact caused by arthroplasty, osteopenia, and motion. There is a moderate suprapatellar effusion. Ligaments Suboptimally assessed by CT. Muscles and Tendons No acute findings. Soft tissues No acute findings. IMPRESSION: 1. Total knee arthroplasty. No acute fracture. 2. Moderate suprapatellar effusion. Electronically Signed   By: Elgie Collard M.D.   On: 03/16/2023 19:16   CT L-SPINE NO CHARGE  Result Date: 03/16/2023 CLINICAL DATA:  Abdominal pain, acute, nonlocalized. EXAM: CT LUMBAR SPINE WITHOUT CONTRAST TECHNIQUE: Technique: Multiplanar CT  images of the lumbar spine were reconstructed from contemporary CT of the Abdomen and Pelvis. RADIATION DOSE REDUCTION: This exam was performed according to the departmental dose-optimization program which includes automated exposure control, adjustment of the mA and/or kV according to patient size and/or use  of iterative reconstruction technique. CONTRAST:  None. COMPARISON:  Lumbar spine CT 01/27/2023 FINDINGS: Segmentation: Transitional lumbosacral anatomy with partially sacralized L5. Alignment: Normal. Vertebrae: No acute fracture or suspicious osseous lesion. Paraspinal and other soft tissues: No acute abnormality identified in the paraspinal soft tissues. Remainder of the abdomen and pelvis reported separately. 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 Moderate spinal stenosis at L2-3 and L3-4 and severe spinal stenosis at L4-5, similar to the prior study. Mild-to-moderate multilevel neural foraminal stenosis. IMPRESSION: 1. No acute osseous abnormality identified in the lumbar spine. 2. 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 Electronically Signed   By: Sebastian Ache M.D.   On: 03/16/2023 12:11   CT ABDOMEN PELVIS WO CONTRAST  Result Date: 03/16/2023 CLINICAL DATA:  Abdominal pain EXAM: CT ABDOMEN AND PELVIS WITHOUT CONTRAST TECHNIQUE: Multidetector CT imaging of the abdomen and pelvis was performed following the standard protocol without IV contrast. RADIATION DOSE REDUCTION: This exam was performed according to the departmental dose-optimization program which includes automated exposure control, adjustment of the mA and/or kV according to patient size and/or use of iterative reconstruction technique. COMPARISON:  CT 02/04/2023 FINDINGS: Lower chest: Linear opacity lung bases likely scar or atelectasis. No pleural effusion. Breathing motion. Heart is enlarged. Trace pericardial fluid. Small hiatal hernia. Hepatobiliary: On this  non IV contrast exam, the liver is grossly preserved. Stones in the gallbladder. Pancreas: Unremarkable. No pancreatic ductal dilatation or surrounding inflammatory changes. Spleen: Normal in size without focal abnormality. Small splenule noted inferior to the spleen. Adrenals/Urinary Tract: Stable nodularity to the adrenal gland on the right. Left adrenal gland is preserved. Punctate left-sided nonobstructing renal stones. No ureteral stones. Preserved contours of the urinary bladder. Stomach/Bowel: Significant stool in the rectum. Scattered colonic diverticula greatest along the sigmoid region. No bowel obstruction, free air or free fluid. The stomach is collapsed. Small bowel is nondilated. Normal appendix in the right lower quadrant lateral to the cecum. Best seen on coronal imaging. Vascular/Lymphatic: Normal caliber aorta and IVC with mild vascular calcifications. No specific abnormal lymph node enlargement seen in the abdomen and pelvis. Reproductive: Status post hysterectomy. No adnexal masses. Other: No free air or free fluid. There is artifact along the upper abdomen limiting evaluation with both arms crossed over the anterior abdomen. Patient is tilted. Musculoskeletal: Scattered degenerative changes along the spine. IMPRESSION: No bowel obstruction, free air or free fluid. Few colonic diverticula. Small hiatal hernia. Punctate nonobstructing left-sided renal stones. Electronically Signed   By: Karen Kays M.D.   On: 03/16/2023 12:06   DG Knee Complete 4 Views Right  Result Date: 03/16/2023 CLINICAL DATA:  Knee pain, decreased mobility EXAM: RIGHT KNEE - COMPLETE 4+ VIEW COMPARISON:  None Available. FINDINGS: Osseous demineralization. Components of a RIGHT knee prosthesis identified. Moderate joint effusion with inferior patellar spurring noted. No acute fracture, dislocation, or bone destruction. IMPRESSION: RIGHT knee prosthesis with moderate joint effusion. No acute osseous abnormalities.  Electronically Signed   By: Ulyses Southward M.D.   On: 03/16/2023 12:04   DG Hips Bilat W or Wo Pelvis 2 Views  Result Date: 03/16/2023 CLINICAL DATA:  Knee pain, impaired mobility EXAM: DG HIP (WITH OR WITHOUT PELVIS) 2V BILAT COMPARISON:  None Available. FINDINGS: Osseous demineralization. Hip and SI joint spaces preserved. No acute fracture, dislocation, or bone destruction. IMPRESSION: No acute osseous abnormalities. Electronically Signed   By: Ulyses Southward M.D.   On: 03/16/2023 11:41   DG Foot Complete Left  Result  Date: 03/16/2023 CLINICAL DATA:  Decreased mobility, chest pain EXAM: LEFT FOOT - COMPLETE 3+ VIEW COMPARISON:  None Available. FINDINGS: Osseous demineralization. Scattered narrowing of IP joints. Minimal narrowing of first MTP joint. No acute fracture, dislocation, or bone destruction. IMPRESSION: Osseous demineralization with minimal degenerative changes as above. No acute abnormalities. Electronically Signed   By: Ulyses Southward M.D.   On: 03/16/2023 11:40   DG Chest 1 View  Result Date: 03/16/2023 CLINICAL DATA:  Chest pain EXAM: CHEST  1 VIEW COMPARISON:  CXR 02/04/23 FINDINGS: No pleural effusion. No pneumothorax. No focal airspace opacity. Unchanged asymmetric elevation of the right hemidiaphragm. Normal cardiac and mediastinal contours. No radiographically apparent displaced rib fractures. Visualized upper abdomen is notable for colonic gaseous distention. IMPRESSION: No focal airspace opacity Electronically Signed   By: Lorenza Cambridge M.D.   On: 03/16/2023 11:08    Microbiology: Results for orders placed or performed during the hospital encounter of 03/16/23  Aerobic/Anaerobic Culture w Gram Stain (surgical/deep wound)     Status: None   Collection Time: 03/20/23 10:42 AM   Specimen: Synovium; Synovial Fluid  Result Value Ref Range Status   Specimen Description SYNOVIAL  Final   Special Requests RT KNEE  Final   Gram Stain   Final    ABUNDANT WBC PRESENT, PREDOMINANTLY PMN NO  ORGANISMS SEEN    Culture   Final    No growth aerobically or anaerobically. Performed at [facility], [address] [place], [place] [address]    Report Status 03/25/2023 FINAL  Final  Surgical pcr screen     Status: Abnormal   Collection Time: 03/23/23  8:25 AM   Specimen: Nasal Mucosa; Nasal Swab  Result Value Ref Range Status   MRSA, PCR NEGATIVE NEGATIVE Final   Staphylococcus aureus POSITIVE (A) NEGATIVE Final    Comment: (NOTE) The Xpert SA Assay (FDA approved for NASAL specimens in patients 44 years of age and older), is one component of a comprehensive surveillance program. It is not intended to diagnose infection nor to guide or monitor treatment. Performed at [facility], [address] [place], [place] [address]   Aerobic/Anaerobic Culture w Gram Stain (surgical/deep wound)     Status: None (Preliminary result)   Collection Time: 03/23/23  7:42 PM   Specimen: Soft Tissue, Other  Result Value Ref Range Status   Specimen Description TISSUE RIGHT KNEE  Final   Special Requests SAMPLE A  Final   Gram Stain   Final    FEW WBC PRESENT,BOTH PMN AND MONONUCLEAR NO ORGANISMS SEEN    Culture   Final    NO GROWTH 3 DAYS NO ANAEROBES ISOLATED; CULTURE IN PROGRESS FOR 5 DAYS Performed at [facility], [address], [place], [place] [address]    Report Status PENDING  Incomplete  Aerobic/Anaerobic Culture w Gram Stain (surgical/deep wound)     Status: None (Preliminary result)   Collection Time: 03/23/23  7:54 PM   Specimen: Path fluid; Tissue  Result Value Ref Range Status   Specimen Description FLUID RIGHT KNEE  Final   Special Requests SAMPLE B  Final   Gram Stain NO WBC SEEN NO ORGANISMS SEEN   Final   Culture   Final    NO GROWTH 3 DAYS NO ANAEROBES ISOLATED; CULTURE IN PROGRESS FOR 5 DAYS Performed at [facility], [address], [place], [place] [address]    Report Status PENDING  Incomplete   Labs: CBC: Recent  Labs  Lab  03/21/23 0218 03/22/23 0250 03/23/23 0317 03/24/23 0949 03/25/23 0353  WBC 5.8 7.4 10.2 12.2* 8.4  NEUTROABS  --  6.1  --   --  5.3  HGB 14.2 12.1 11.7* 11.8* 10.3*  HCT 44.6 37.7 37.6 36.0 31.7*  MCV 91.6 89.8 93.3 88.7 90.3  PLT 273 243 347 368 318   Basic Metabolic Panel: Recent Labs  Lab 03/21/23 0218 03/22/23 0250 03/23/23 0317 03/24/23 0949 03/25/23 0353  NA 138 135 138 137 136  K 4.4 5.1 4.0 3.9 3.8  CL 102 103 106 105 102  CO2 20* 19* 22 19* 21*  GLUCOSE 89 155* 114* 107* 110*  BUN 25* 27* 25* 17 16  CREATININE 0.91 0.82 0.89 0.89 0.83  CALCIUM 9.6 9.3 9.1 9.1 8.6*   Liver Function Tests: No results for input(s): "AST", "ALT", "ALKPHOS", "BILITOT", "PROT", "ALBUMIN" in the last 168 hours. CBG: Recent Labs  Lab 03/24/23 0159 03/24/23 0731  GLUCAP 125* 117*    Discharge time spent: greater than 30 minutes.  Signed: Lynden Oxford, MD [facility]

## 2023-03-28 LAB — AEROBIC/ANAEROBIC CULTURE W GRAM STAIN (SURGICAL/DEEP WOUND)
Culture: NO GROWTH
Gram Stain: NONE SEEN

## 2023-03-31 ENCOUNTER — Emergency Department (HOSPITAL_COMMUNITY)
Admission: EM | Admit: 2023-03-31 | Discharge: 2023-03-31 | Disposition: A | Discharge status: Home / Self Care | Payer: Medicare Other | Attending: Emergency Medicine | Admitting: Emergency Medicine

## 2023-03-31 ENCOUNTER — Emergency Department (HOSPITAL_COMMUNITY): Payer: Medicare Other

## 2023-03-31 DIAGNOSIS — W19XXXA Unspecified fall, initial encounter: Secondary | ICD-10-CM | POA: Insufficient documentation

## 2023-03-31 DIAGNOSIS — Y92129 Unspecified place in nursing home as the place of occurrence of the external cause: Secondary | ICD-10-CM | POA: Insufficient documentation

## 2023-03-31 DIAGNOSIS — F03C0 Unspecified dementia, severe, without behavioral disturbance, psychotic disturbance, mood disturbance, and anxiety: Secondary | ICD-10-CM | POA: Diagnosis not present

## 2023-03-31 DIAGNOSIS — Z96651 Presence of right artificial knee joint: Secondary | ICD-10-CM | POA: Diagnosis not present

## 2023-03-31 DIAGNOSIS — M25561 Pain in right knee: Secondary | ICD-10-CM | POA: Diagnosis present

## 2023-03-31 NOTE — ED Notes (Signed)
Attempted report to [facility]; no answer from nurse; Lura Em to have nurse call this RN for report

## 2023-03-31 NOTE — ED Notes (Addendum)
[facility] at bedside; report given to [facility], nurse at [facility]

## 2023-03-31 NOTE — ED Provider Notes (Signed)
[place] EMERGENCY DEPARTMENT AT [facility] Provider Note   CSN: [id number] Arrival date & time: 03/31/23  1244     History  Chief Complaint  Patient presents with   Knee Pain    Monica Erickson is a 77 y.o. female who presents to ED from nursing facility after unwitnessed fall. Pt currently being treated for septic prosthetic right knee with PICC line antibiotics at facility. History of severe dementia, appears to be at baseline mental status. No signs of head or other injury. Sent from nursing facility for knee XR to rule out complication post recent joint aspiration by orthopedics.       Home Medications Prior to Admission medications   Medication Sig Start Date End Date Taking? Authorizing Provider  acetaminophen (TYLENOL) 325 MG tablet Take 2 tablets (650 mg total) by mouth every 6 (six) hours as needed for mild pain (or Fever >/= 101). 03/27/23   Rolly Salter, MD  acetaminophen (TYLENOL) 500 MG tablet Take 1 tablet (500 mg total) by mouth 2 (two) times daily. 03/27/23   Rolly Salter, MD  aspirin EC 81 MG tablet Take 1 tablet (81 mg total) by mouth in the morning and at bedtime for 28 days. Swallow whole. 03/27/23 04/24/23  Rolly Salter, MD  cefTRIAXone (ROCEPHIN) IVPB Inject 2 g into the vein daily. Indication:  PJI of right knee  First Dose: Yes Last Day of Therapy:  05/04/23 Labs - Once weekly:  CBC/D and BMP, Labs - Every other week:  ESR and CRP Method of administration: IV Push Method of administration may be changed at the discretion of home infusion pharmacist based upon assessment of the patient and/or caregiver's ability to self-administer the medication ordered. 03/27/23 05/06/23  Rolly Salter, MD  cyanocobalamin (VITAMIN B12) 1000 MCG/ML injection Inject 1,000 mcg into the muscle once a week.  08/05/24  [provider]  daptomycin (CUBICIN) IVPB Inject 600 mg into the vein daily. Indication:  PJI of right knee First Dose: Yes Last Day of  Therapy:  05/04/23 Labs - Once weekly:  CBC/D, BMP, and CPK Labs - Every other week:  ESR and CRP Method of administration: IV Push Method of administration may be changed at the discretion of home infusion pharmacist based upon assessment of the patient and/or caregiver's ability to self-administer the medication ordered. 03/27/23 05/06/23  Rolly Salter, MD  diazepam (VALIUM) 2 MG tablet Take 1 tablet (2 mg total) by mouth 2 (two) times daily as needed for anxiety. 03/27/23   Rolly Salter, MD  docusate sodium (COLACE) 100 MG capsule Take 1 capsule (100 mg total) by mouth 2 (two) times daily. 03/27/23   Rolly Salter, MD  feeding supplement (ENSURE ENLIVE / ENSURE PLUS) LIQD Take 237 mLs by mouth 3 (three) times daily between meals. 03/27/23   Rolly Salter, MD  lidocaine (LIDODERM) 5 % Place 1 patch onto the skin daily. Remove & Discard patch within 12 hours or as directed by MD 03/27/23   Rolly Salter, MD      Allergies    Patient has no known allergies.    Review of Systems   Review of Systems  All other systems reviewed and are negative.   Physical Exam Updated Vital Signs BP (!) 155/72 (BP Location: Left Arm)   Pulse 79   Temp 98.4 F (36.9 C) (Oral)   Resp 17   SpO2 100%  Physical Exam Vitals and nursing note reviewed.  Constitutional:  General: She is not in acute distress. HENT:     Head: Normocephalic and atraumatic.     Right Ear: External ear normal.     Left Ear: External ear normal.     Mouth/Throat:     Mouth: Mucous membranes are moist.  Eyes:     General: No scleral icterus.    Extraocular Movements: Extraocular movements intact.  Cardiovascular:     Rate and Rhythm: Normal rate.  Pulmonary:     Effort: Pulmonary effort is normal.     Breath sounds: Normal breath sounds.  Chest:     Chest wall: No tenderness.  Abdominal:     General: Abdomen is flat.     Palpations: Abdomen is soft.     Tenderness: There is no abdominal tenderness.   Musculoskeletal:        General: No deformity.     Cervical back: Normal range of motion and neck supple. No rigidity or tenderness.     Comments: Right knee without obvious deformity, no increased warmth or erythema, tenderness superior to patella, no identifiable areas of fluctuance or induration, moving all extremities x 4  Skin:    General: Skin is warm and dry.  Neurological:     Mental Status: She is alert. Mental status is at baseline.     ED Results / Procedures / Treatments   Labs (all labs ordered are listed, but only abnormal results are displayed) Labs Reviewed - No data to display   EKG None  Radiology DG Knee 2 Views Right  Result Date: 03/31/2023 CLINICAL DATA:  Fall. 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 Arthroplasty components are in their expected alignment. No periprosthetic lucency or fracture. Small-moderate knee joint effusion. There is a moderate amount of intra-articular air in the knee joint. Soft tissue swelling at the anterior aspect of the knee. IMPRESSION: 1. 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 2. Small-moderate knee joint effusion with intra-articular air. Air within the joint is greater than expected to be residua of the previous knee aspiration performed 11 days ago. Infection or traumatic arthrotomy are additional considerations. Correlate for signs of infection. Electronically Signed   By: Duanne Guess D.O.   On: 03/31/2023 13:32    Procedures Procedures   Medications Ordered in ED Medications - No data to display  ED Course/ Medical Decision Making/ A&P                           Medical Decision Making Amount and/or Complexity of Data Reviewed Radiology: ordered. Decision-making details documented in ED Course.   Medical Decision Making:   [name] is a 78 y.o. female who presented to the ED today with fall detailed above.    Additional history discussed with patient's family/caregivers.  Patient's presentation is complicated by their history of dementia, recent procedure, infected prosthesis.  Complete initial physical exam performed, notably the patient was in NAD. No signs of head, spinal, chest, or abdominal trauma. No deformity to right knee. Neurovascularly intact.    Reviewed and confirmed nursing documentation for past medical history, family history, social history.    Initial Assessment:   With the patient's presentation, differential diagnosis includes but is not limited to fracture, dislocation, contusion, sprain, strain, compartment syndrome, postoperative pain, septic joint.   This is most consistent with  an acute complicated illness  Initial Plan:  XR to assess for bony pathology Objective evaluation as below reviewed   Initial Study Results:   Radiology:  All images reviewed independently.  DG Knee 2 Views Right  Result Date: 03/31/2023 CLINICAL DATA:  Fall. 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 Arthroplasty components are in their expected alignment. No periprosthetic lucency or fracture. Small-moderate knee joint effusion. There is a moderate amount of intra-articular air in the knee joint. Soft tissue swelling at the anterior aspect of the knee. IMPRESSION: 1. 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 2. Small-moderate knee joint effusion with intra-articular air. Air within the joint is greater than expected to be residua of the previous knee aspiration performed 11 days ago. Infection or traumatic arthrotomy are additional considerations. Correlate for signs of infection. Electronically Signed   By: Duanne Guess D.O.   On:  03/31/2023 13:32     Consults: Case discussed with Charma Igo, PA-C with orthopedics who reviewed case, imaging with Dr. Charlann Boxer and agrees normal amount of air post-procedure, no additional workup indicated.   Final Assessment and Plan:   77 year old female presents to ED post unwitnessed fall at nursing facility for evaluation. Currently being treated for infected right knee prosthesis, status post joint aspiration earlier this month. Sent for XR of knee for evaluation post fall. XR with questionable increased air compared to post-procedural expectations. Pt comfortably appearing on exam, no deformity to knee, no other signs of trauma. No anticoagulant use. Discussed with orthopedics as above who after reviewing films are comfortable with pt being discharged home. Attempted to contact nursing facility multiple times to discuss with no answer. Multiple calls also placed to family with no answer. Prior to discharge was able to discuss results, consult, plan with daughter/granddaughter. Pt stable at time of discharge.     Clinical Impression:  1. Fall, initial encounter   2. Right knee pain, unspecified chronicity      Discharge     Final Clinical Impression(s) / ED Diagnoses Final diagnoses:  Fall, initial encounter  Right knee pain, unspecified chronicity    Rx / DC Orders ED Discharge Orders     None         Tonette Lederer, PA-C 03/31/23 1756    Gloris Manchester, MD 04/05/23 [phone]

## 2023-03-31 NOTE — Discharge Instructions (Addendum)
The x-Renn was reviewed by orthopedic surgery and we do not see any complications related to the recent infection or fall today. Patient should continue antibiotics and other medications as prescribed at home. Follow up with orthopedic surgery as scheduled or sooner for any concerns. For any new injury, fever, vomiting, lethargy, increased confusion, or other new, concerning symptoms, return to ED for re-evaluation.

## 2023-03-31 NOTE — ED Triage Notes (Signed)
Pt to ED via [facility] from [facility]. Pt was in hospital a few weeks ago d/t infection of right knee from previous right knee replacement years ago. Pt has PICC line in and is receiving IV antibiotics at nursing facility. Pt had fall at 4am. Pt denied symptoms at 4am and was told she would have x rays done, however were not completed when pt's daughter arrived to visit pt this morning, and facility sent her to ER for x rays of knee. Unwitnessed fall. Pt not on blood thinners. Denies hitting head, but unsure. Pt denies any new complaints. Pt has hx of dementia.   EMS Vitals: 158/80 77 HR 96% RA 18 RR

## 2023-03-31 NOTE — ED Notes (Signed)
Patient verbalizes understanding of discharge instructions. Opportunity for questioning and answers were provided. Pt discharged from ED. 

## 2023-03-31 NOTE — ED Notes (Signed)
Attempted report again to [facility]; spoke with Misty Stanley, who reports nurse Alcario Drought is busy at the moment and will call this RN back; left number again

## 2023-03-31 NOTE — ED Notes (Signed)
Offered pt food and beverage; pt declining food and beverage at this time

## 2023-04-14 ENCOUNTER — Encounter: Payer: Self-pay | Admitting: Internal Medicine

## 2023-04-14 ENCOUNTER — Telehealth: Payer: Self-pay

## 2023-04-14 ENCOUNTER — Other Ambulatory Visit: Payer: Self-pay

## 2023-04-14 ENCOUNTER — Ambulatory Visit (INDEPENDENT_AMBULATORY_CARE_PROVIDER_SITE_OTHER): Payer: Medicare Other | Admitting: Internal Medicine

## 2023-04-14 VITALS — BP 97/62 | HR 77 | Resp 16 | Ht 68.0 in

## 2023-04-14 DIAGNOSIS — T8453XD Infection and inflammatory reaction due to internal right knee prosthesis, subsequent encounter: Secondary | ICD-10-CM

## 2023-04-14 MED ORDER — DOXYCYCLINE MONOHYDRATE 25 MG/5ML PO SUSR: 100.0000 mg | Freq: Two times a day (BID) | ORAL | 3 refills | Status: DC

## 2023-04-14 NOTE — Progress Notes (Unsigned)
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 pylori infection    Pulmonary embolism (HCC) 02/25/2022   Failure to thrive in adult 02/25/2022   Sepsis due to pneumonia (HCC) 02/25/2022   Hypokalemia 02/25/2022   AMS (altered mental status) 02/23/2022   Pseudophakia of both eyes 02/26/2021   Alzheimer's dementia (HCC) 09/24/2020   Generalized abdominal pain 02/17/2020   Poor appetite 02/17/2020   Weight loss 02/17/2020   Constipation 02/17/2020   Other hyperlipidemia 02/17/2020   Belching 02/17/2020   Stone, kidney 12/07/2018   Dementia without behavioral disturbance (HCC) 12/07/2018    Patient's Medications  New Prescriptions   No medications on file  Previous Medications   ACETAMINOPHEN (TYLENOL) 325 MG TABLET    Take 2 tablets (650 mg total) by mouth every 6 (six) hours as needed for mild pain (or Fever >/= 101).   ACETAMINOPHEN (TYLENOL) 500 MG TABLET    Take 1 tablet (500 mg total) by mouth 2 (two) times daily.   ASPIRIN EC 81 MG TABLET    Take 1 tablet (81 mg total) by mouth in the morning and at bedtime for 28 days. Swallow whole.   CEFTRIAXONE (ROCEPHIN) IVPB    Inject 2 g into the vein daily. Indication:  PJI of right knee  First Dose: Yes Last Day of Therapy:  05/04/23 Labs - Once weekly:  CBC/D and BMP, Labs - Every other week:  ESR and CRP Method of administration: IV Push Method of administration may be changed at the discretion of home infusion pharmacist based upon assessment of the patient and/or caregiver's ability to self-administer the medication ordered.   CYANOCOBALAMIN (VITAMIN B12) 1000 MCG/ML INJECTION    Inject 1,000 mcg into the muscle once a week.   DAPTOMYCIN (CUBICIN) IVPB    Inject 600 mg into the vein daily. Indication:   PJI of right knee First Dose: Yes Last Day of Therapy:  05/04/23 Labs - Once weekly:  CBC/D, BMP, and CPK Labs - Every other week:  ESR and CRP Method of administration: IV Push Method of administration may be changed at the discretion of home infusion pharmacist based upon assessment of the patient and/or caregiver's ability to self-administer the medication ordered.   DIAZEPAM (VALIUM) 2 MG TABLET    Take 1 tablet (2 mg total) by mouth 2 (two) times daily as needed for anxiety.   DOCUSATE SODIUM (COLACE) 100 MG CAPSULE    Take 1 capsule (100 mg total) by mouth 2 (two) times daily.   FEEDING SUPPLEMENT (ENSURE ENLIVE / ENSURE PLUS) LIQD    Take 237 mLs by mouth 3 (three) times daily between meals.   LIDOCAINE (LIDODERM) 5 %    Place 1 patch onto the skin daily. Remove & Discard patch within 12 hours or as directed by MD  Modified Medications   No medications on file  Discontinued Medications   No medications on file    Subjective: 77 year old female with past medical history as below presents for hospital follow-up of right knee PJI.  Admitted to [facility] 5/11-17.  Patient had initially undergone right TKA 15 years ago in [place].  She has advanced dementia and lives at home with family.  She has  been complaining of right knee pain which led to admission 03/16/2023.  On arrival vital stable.  IR guided arthrocentesis showing 1100 WBC, 92% neutrophils and crystals, cultures with no growth.  Ortho engaged, underwent debridement with poly exchange on 5/13 cultures no growth.  Discussed with Ortho that pseudogout without infection is rare in the setting of prosthetic joint.  Plan to treat with 6 weeks of antibiotics.  Discharged on Dapto and ceftriaxone x 6 weeks on the OR on 5/13 EOT 05/04/2023. Today 04/14/2023: Patient presents with aide and daughter.  Tolerating antibiotics.  They do note that she is very difficult to get to take pills for any p.o. suppression.  Review of  Systems: Review of Systems  All other systems reviewed and are negative.   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 pylori infection 03/2020   Kidney stones    Night terrors    Sleep apnea    no cpap    Social History   Tobacco Use   Smoking status: Former    Years: 5    Types: Cigarettes   Smokeless tobacco: Never  Vaping Use   Vaping Use: Never used  Substance Use Topics   Alcohol use: Not Currently    Comment: occasional wine   Drug use: Never    Family History  Problem Relation Age of Onset   Diabetes Mother    Hypertension Father    Colon cancer Neg Hx    Esophageal cancer Neg Hx    Stomach cancer Neg Hx    Rectal cancer Neg Hx     No Known Allergies  Health Maintenance  Topic Date Due   COVID-19 Vaccine (1) Never done   Hepatitis C Screening  Never done   DTaP/Tdap/Td (1 - Tdap) Never done   Zoster Vaccines- Shingrix (1 of 2) Never done   Pneumonia Vaccine 74+ Years old (1 of 1 - PCV) Never done   DEXA SCAN  Never done   Medicare Annual Wellness (AWV)  12/14/2020   INFLUENZA VACCINE  06/11/2023   Colonoscopy  03/23/2030   HPV VACCINES  Aged Out    Objective:  Vitals:   04/14/23 1038  BP: 97/62  Pulse: 77  Resp: 16  Height: 5\' 8"  (1.727 m)   Body mass index is 24.47 kg/m.  Physical Exam HENT:     Head: Normocephalic and atraumatic.     Nose: Nose normal.     Mouth/Throat:     Mouth: Mucous membranes are moist.  Eyes:     Extraocular Movements: Extraocular movements intact.     Conjunctiva/sclera: Conjunctivae normal.     Pupils: Pupils are equal, round, and reactive to light.  Cardiovascular:     Rate and Rhythm: Normal rate and regular rhythm.     Heart sounds: No murmur heard.    No friction rub. No gallop.  Pulmonary:     Effort: Pulmonary  effort is normal.     Breath sounds: Normal breath sounds.  Abdominal:     General: Abdomen is flat.     Palpations: Abdomen is soft.  Musculoskeletal:     Cervical back: Neck supple.  Skin:    General: Skin is warm and dry.  Neurological:  Mental Status: She is alert.  Psychiatric:        Mood and Affect: Mood normal.     Lab Results Lab Results  Component Value Date   WBC 8.4 03/25/2023   HGB 10.3 (L) 03/25/2023   HCT 31.7 (L) 03/25/2023   MCV 90.3 03/25/2023   PLT 318 03/25/2023    Lab Results  Component Value Date   CREATININE 0.83 03/25/2023   BUN 16 03/25/2023   NA 136 03/25/2023   K 3.8 03/25/2023   CL 102 03/25/2023   CO2 21 (L) 03/25/2023    Lab Results  Component Value Date   ALT 11 03/16/2023   AST 22 03/16/2023   ALKPHOS 91 03/16/2023   BILITOT 1.5 (H) 03/16/2023    No results found for: "CHOL", "HDL", "LDLCALC", "LDLDIRECT", "TRIG", "CHOLHDL" No results found for: "LABRPR", "RPRTITER" No results found for: "HIV1RNAQUANT", "HIV1RNAVL", "CD4TABS"   Problem List Items Addressed This Visit   None  Assessment/Plan #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  Cultures with no growth - Taken to the OR on 5/13 for debridement palliation with cultures no growth - We discussed that pseudogout without infection in the prosthetic joint is rare.  As such plan to treat with 6 weeks of Dapto and ceftriaxone EOT 04/20/2023. -I would like to do at least 3 to 6 months of p.o. suppression with doxycycline as she has a prosthetic joint.  Per daughter patient is not easy to get to take pills.  As such we are doing doxycycline liquid 100 mg twice daily which is about 60 mL per ID.  I counseled daughter that this is a large volume. Plan: - Continue daptomycin and ceftriaxone EOT  05/04/2023. - Pull the PICC after completion of IV antibiotics.  Then start doxycycline liquid for p.o. suppression. - The wound looks like it is healing well.  Follow-up with orthopedics. - Follow-up with infectious disease on 05/11/2023.   #Medication monitoring #PICC in place -6/3 scr 0.89, wbc 6.30, crp 3.74, esr 67,     Danelle Earthly, MD [facility] 04/14/2023, 10:58 AM   I have personally spent 45 minutes involved in face-to-face and non-face-to-face activities for this patient on the day of the visit. Professional time spent includes the following activities: Preparing to see the patient (review of tests), Obtaining and/or reviewing separately obtained history (admission/discharge record), Performing a medically appropriate examination and/or evaluation , Ordering medications/tests/procedures, referring and communicating with other health care professionals, Documenting clinical information in the EMR, Independently interpreting results (not separately reported), Communicating results to the patient/family/caregiver, Counseling and educating the patient/family/caregiver and Care coordination (not separately reported).

## 2023-04-14 NOTE — Telephone Encounter (Signed)
Per  Dr Thedore Mins ok to PULL PICC 05/04/23 and begin oral Doxy BID (Liquid form) 05/05/23. Advised nurse from SNF [facility]) of changes and family member who both came to visit. Printed order for the liquid Doxy. Alos advised [facility] phamacy as FYI.

## 2023-04-20 ENCOUNTER — Telehealth: Payer: Self-pay

## 2023-04-20 NOTE — Telephone Encounter (Signed)
patient daughter [name] called stating that patient was released from SNF on Friday. [name] stated that she spoke with Dr.Singh and you said patient could be on oral abx instead of keeping picc line in until 6/24. HH nurse is coming out today at 2 PM and can pull picc if okay with you.    Per Dr.Singh - patient needs to continue IV abx and have PICC line pulled on 6/24 - then transition to doxycyline po.    Left voicemail asking for [name] to return my call to discuss.   Ginelle Bays Lesli Albee, CMA

## 2023-04-20 NOTE — Telephone Encounter (Signed)
[name] returned call, relayed that Dr. Thedore Mins would like for Monica Erickson to keep the PICC line through 05/04/23. [name] is asking for additional details as to why she needs to remain on IV antibiotics and can't be switched to orals. She reports Monica Erickson is attempting to pull at the PICC line and they are worried she may try to pull it out.   Sandie Ano, RN

## 2023-04-21 NOTE — Telephone Encounter (Addendum)
Received call from [facility] stating patient's daughter had some concerns regarding IV antibiotics. Told home health that she would stop by office to discuss plan of care.  When I called [name] (daughter) she said that someone had called her and answered her questions.  Advised she call back if needed. Juanita Laster, RMA

## 2023-04-21 NOTE — Telephone Encounter (Signed)
Informed patient daughter Monica Erickson - per Dr.Singh patient will need to continue IV abx until 6/24. Monica Erickson aware and agreeable with plan.    [name] Lesli Albee, CMA

## 2023-04-28 ENCOUNTER — Telehealth: Payer: Self-pay | Admitting: Internal Medicine

## 2023-04-28 ENCOUNTER — Telehealth: Payer: Self-pay

## 2023-04-28 MED ORDER — LINEZOLID 600 MG PO TABS: 600.0000 mg | ORAL_TABLET | Freq: Two times a day (BID) | ORAL | 0 refills | Status: AC

## 2023-04-28 MED ORDER — CEFADROXIL 500 MG PO CAPS: 1000.0000 mg | ORAL_CAPSULE | Freq: Two times a day (BID) | ORAL | 0 refills | Status: AC

## 2023-04-28 NOTE — Telephone Encounter (Signed)
Received notification from Jeri Modena, RN with [employer] that patient's PICC line became dislodged last night. Per picture, appears that PICC line is out approximately 7 cm. Asked Pam to make sure that the line is taped down and secured and that patient does not infuse anything until placement can be confirmed. Will route to provider.   Sandie Ano, RN

## 2023-04-28 NOTE — Telephone Encounter (Signed)
Per Dr. Thedore Mins:  "Stop IV abx, as pt pulled out PICC Start linezolid and cefadroxil to complete course till 6/24 Start doxy 100mg  PO bid for suppression on 6/25"      Orders sent to Jeri Modena, RN with [employer] to stop IV antibiotics and have PICC line removed. Spoke with patient's daughter Monica Erickson and relayed that Monica Erickson will need to be on the linezolid and cefadroxil from now until 6/24, then start the doxycycline on 6/25. Monica Erickson is asking how long Monica Erickson will need to be on the doxycycline, advised that Dr. Thedore Mins will be able to discuss timeline more thoroughly at her 7/1 appointment. Monica Erickson verbalized understanding and has no further questions.   Sandie Ano, RN

## 2023-04-28 NOTE — Telephone Encounter (Signed)
Stop IV abx, as pt pulled out PICC Start linezolid and cefadroxil to complete course till 6/24 Start doxy 100mg  PO bid for suppression on 6/25

## 2023-04-28 NOTE — Telephone Encounter (Signed)
Thank you :)

## 2023-04-30 ENCOUNTER — Telehealth: Payer: Self-pay | Admitting: Pharmacist

## 2023-04-30 NOTE — Telephone Encounter (Signed)
error 

## 2023-05-05 ENCOUNTER — Telehealth: Payer: Self-pay

## 2023-05-05 NOTE — Telephone Encounter (Signed)
She can continue taking linezolid - simply need to monitor for symptoms of serotonin syndrome such as agitation, nausea, vomiting, hyperthermia, sweating, muscle rigidity, or tremors while on tramadol. Especially ok to continue given she is only taking this for 7 days. - Marchelle Folks

## 2023-05-05 NOTE — Telephone Encounter (Signed)
Received fax from [facility] regarding drug drug interaction between Linezolid and Tramadol. "Current use of linezolid and opioids increase the risk of serotonin syndrome. Will forward message to provider and pharmacy staff. Juanita Laster, RMA

## 2023-05-06 NOTE — Telephone Encounter (Signed)
Spoke with patient's daughter [name] regarding possible drug drug interaction. Understands to call office if she notices any symptoms listed in Amanda's note. Verbalized understanding.  Juanita Laster, RMA

## 2023-05-11 ENCOUNTER — Telehealth (INDEPENDENT_AMBULATORY_CARE_PROVIDER_SITE_OTHER): Payer: Medicare Other | Admitting: Internal Medicine

## 2023-05-11 ENCOUNTER — Other Ambulatory Visit: Payer: Self-pay

## 2023-05-11 ENCOUNTER — Ambulatory Visit: Payer: Medicare Other | Admitting: Internal Medicine

## 2023-05-11 ENCOUNTER — Telehealth: Payer: Self-pay

## 2023-05-11 DIAGNOSIS — T8453XD Infection and inflammatory reaction due to internal right knee prosthesis, subsequent encounter: Secondary | ICD-10-CM | POA: Diagnosis not present

## 2023-05-11 NOTE — Telephone Encounter (Signed)
Patient missed her appointment this morning, okay to do a video visit this afternoon per Dr. Thedore Mins. Spoke with patient's daughter Monica Erickson (DPR) and rescheduled.   Dr. Thedore Mins needs an ESR, CRP, CBC, and CMP collected. Monica Erickson will reach out to their home health nurse to have this arranged. Provided her with the labs that we need.   Sandie Ano, RN

## 2023-05-11 NOTE — Progress Notes (Signed)
Subjective:    Patient ID: Monica Erickson, female    DOB: Feb 16, 1946, 76 y.o.   MRN: [id number]     Virtual Visit via Telephone/Video Note   I connected with Monica Erickson  on 05/11/2023 at 2:57 PM by telephone and verified that I am speaking with the correct person using two identifiers.   I discussed the limitations, risks, security and privacy concerns of performing an evaluation and management service by telephone and the availability of in person appointments. I also discussed with the patient that there may be a patient responsible charge related to this service. The patient expressed understanding and agreed to proceed.  Location:  Patient: Home Provider: [facility]   HPI:  Monica Erickson is a 77 y.o. female with past medical history as below presents for hospital follow-up of right knee PJI.  Admitted to [facility] 5/11-17.  Patient had initially undergone right TKA 15 years ago in [place].  She has advanced dementia and lives at home with family.  She has been complaining of right knee pain which led to admission 03/16/2023.  On arrival vital stable.   IR guided arthrocentesis showing 1100 WBC, 92% neutrophils and crystals, cultures with no growth.  Ortho engaged, underwent debridement with poly exchange on 5/13 cultures no growth.  Discussed with Ortho that pseudogout without infection is rare in the setting of prosthetic joint.  Plan to treat with 6 weeks of antibiotics.  Discharged on Dapto and ceftriaxone x 6 weeks on the OR on 5/13 EOT 05/04/2023. Today 04/14/2023: Patient presents with aide and daughter.  Tolerating antibiotics.  They do note that she is very difficult to get to take pills for any p.o. suppression.     No Known Allergies    Outpatient Medications Prior to Visit  Medication Sig Dispense Refill  . acetaminophen (TYLENOL) 325 MG tablet Take 2 tablets (650 mg total) by mouth every 6 (six) hours as needed for mild pain (or Fever >/= 101).    Marland Kitchen  acetaminophen (TYLENOL) 500 MG tablet Take 1 tablet (500 mg total) by mouth 2 (two) times daily. 30 tablet 0  . cyanocobalamin (VITAMIN B12) 1000 MCG/ML injection Inject 1,000 mcg into the muscle once a week.    . diazepam (VALIUM) 2 MG tablet Take 1 tablet (2 mg total) by mouth 2 (two) times daily as needed for anxiety. 6 tablet 0  . docusate sodium (COLACE) 100 MG capsule Take 1 capsule (100 mg total) by mouth 2 (two) times daily. 10 capsule 0  . doxycycline (VIBRAMYCIN) 25 MG/5ML SUSR Take 20 mLs (100 mg total) by mouth 2 (two) times daily. 240 mL 3  . feeding supplement (ENSURE ENLIVE / ENSURE PLUS) LIQD Take 237 mLs by mouth 3 (three) times daily between meals. 237 mL 12  . lidocaine (LIDODERM) 5 % Place 1 patch onto the skin daily. Remove & Discard patch within 12 hours or as directed by MD 30 patch 0   No facility-administered medications prior to visit.     Past Medical History:  Diagnosis Date  . Chronic headaches   . 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 Depression   . Difficult intravenous access 2021   needs anesthesia , IV team, ultrasound for IV   . Gastric ulcer   . H. pylori infection 03/2020  . Kidney stones   . Night terrors   . Sleep apnea    no cpap     Past Surgical History:  Procedure Laterality Date  . BIOPSY  03/22/2020   Procedure: BIOPSY;  Surgeon: Rachael Fee, MD;  Location: [facility];  Service: Endoscopy;;  . Breast Lift    . CATARACT EXTRACTION Bilateral 01/2020  . COLONOSCOPY WITH PROPOFOL N/A 03/22/2020   Procedure: COLONOSCOPY WITH PROPOFOL;  Surgeon: Rachael Fee, MD;  Location: [facility];  Service: Endoscopy;  Laterality: N/A;  . CYSTOSCOPY WITH STENT PLACEMENT Right 12/07/2018   Procedure: CYSTOSCOPY WITH STENT PLACEMENT;  Surgeon: Crist Fat, MD;  Location: [facility];  Service: Urology;  Laterality: Right;  . CYSTOSCOPY/URETEROSCOPY/HOLMIUM LASER/STENT PLACEMENT Right 12/31/2018   Procedure: CYSTOSCOPY RIGHT  URETEROSCOPY/HOLMIUM LASER RIGHT Lucretia Roers;  Surgeon: Crist Fat, MD;  [facility];  Service: Urology;  Laterality: Right;  . ESOPHAGOGASTRODUODENOSCOPY (EGD) WITH PROPOFOL N/A 03/22/2020   Procedure: ESOPHAGOGASTRODUODENOSCOPY (EGD) WITH PROPOFOL;  Surgeon: Rachael Fee, MD;  Location: [facility];  Service: Endoscopy;  Laterality: N/A;  . I & D KNEE WITH POLY EXCHANGE Right 03/23/2023   Procedure: IRRIGATION AND DEBRIDEMENT KNEE WITH POLY EXCHANGE;  Surgeon: Durene Romans, MD;  Location: [facility] OR;  Service: Orthopedics;  Laterality: Right;  . REPLACEMENT TOTAL KNEE Bilateral 5 YRS AGO  . Tummy tuck         Review of Systems    Objective:    Nursing note and vital signs reviewed.     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  Cultures with no growth - Taken to the OR on 5/13 for debridement palliation with cultures no growth - We discussed that pseudogout without infection in the prosthetic joint is rare.  As such plan to treat with 6 weeks of Dapto and ceftriaxone EOT 04/20/2023. -I would like to do at least 3 to 6 months of p.o. suppression with doxycycline as she has a prosthetic joint.  Per daughter patient is not easy to get to take pills.  As such we are doing doxycycline liquid 100 mg twice daily which is about 60 mL per ID.  I counseled daughter that this is a large volume. Plan:  No orders of the defined types were placed in this encounter.    I discussed the assessment and treatment plan with the patient. The patient was provided an opportunity to ask questions and all were answered. The patient agreed with the plan and demonstrated an understanding of the instructions.   The patient was advised to call back or seek an in-person  evaluation if the symptoms worsen or if the condition fails to improve as anticipated.   I provided   minutes of non-face-to-face time during this encounter.  Follow-up: No follow-ups on file.

## 2023-06-30 ENCOUNTER — Telehealth: Payer: Self-pay | Admitting: Internal Medicine

## 2023-06-30 NOTE — Telephone Encounter (Signed)
Monica Erickson's daughter, Monica Erickson, is hoping to get a call back regarding her mother's care. Monica Erickson is battling alzheimer's and is complaining of knee pain. She had knee surgery a few months ago so understands she may still experience pain. Her knee pain is associated with pressure from standing, is swollen, and warm to the touch. They contacted the surgeon who performed an X-Nipper but was unable to find a cause. Monica Erickson has previously refused physical therapy for her knee.   Patient complains of pain when standing but not when walking. Her daughter wonders if the alzheimer's makes her forget she is in pain or maybe she really is. She is unsure if she should go to the emergency room or make an appointment with Dr. Thedore Mins. Due to her mother's pain level she does not want to move her around, taking her to unnecessary appointments without resolution. Monica Erickson is hoping for answers on her next steps to help her mother. She stated she does have pictures and video to share with the provider showing the swelling and her mother's walking ability. She can be reached at [phone].   Also, Monica Erickson's home nurses need orders to draw blood and would like to know which type of testing is needed for the blood. 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

## 2023-07-01 ENCOUNTER — Emergency Department (HOSPITAL_COMMUNITY): Payer: Medicare Other

## 2023-07-01 ENCOUNTER — Other Ambulatory Visit: Payer: Self-pay

## 2023-07-01 ENCOUNTER — Encounter (HOSPITAL_COMMUNITY): Payer: Self-pay

## 2023-07-01 ENCOUNTER — Emergency Department (HOSPITAL_COMMUNITY)
Admission: EM | Admit: 2023-07-01 | Discharge: 2023-07-02 | Disposition: A | Discharge status: Home / Self Care | Payer: Medicare Other | Attending: Emergency Medicine | Admitting: Emergency Medicine

## 2023-07-01 DIAGNOSIS — R4182 Altered mental status, unspecified: Secondary | ICD-10-CM | POA: Insufficient documentation

## 2023-07-01 DIAGNOSIS — Z1152 Encounter for screening for COVID-19: Secondary | ICD-10-CM | POA: Diagnosis not present

## 2023-07-01 DIAGNOSIS — F0390 Unspecified dementia without behavioral disturbance: Secondary | ICD-10-CM | POA: Diagnosis not present

## 2023-07-01 LAB — URINALYSIS, ROUTINE W REFLEX MICROSCOPIC
Bacteria, UA: NONE SEEN
Bilirubin Urine: NEGATIVE
Glucose, UA: NEGATIVE mg/dL
Hgb urine dipstick: NEGATIVE
Ketones, ur: NEGATIVE mg/dL
Leukocytes,Ua: NEGATIVE
Nitrite: NEGATIVE
Protein, ur: 30 mg/dL — AB
Specific Gravity, Urine: 1.021 (ref 1.005–1.030)
pH: 6 (ref 5.0–8.0)

## 2023-07-01 LAB — CBC WITH DIFFERENTIAL/PLATELET
Abs Immature Granulocytes: 0.04 10*3/uL (ref 0.00–0.07)
Basophils Absolute: 0 10*3/uL (ref 0.0–0.1)
Basophils Relative: 0 %
Eosinophils Absolute: 0 10*3/uL (ref 0.0–0.5)
Eosinophils Relative: 0 %
HCT: 32.8 % — ABNORMAL LOW (ref 36.0–46.0)
Hemoglobin: 10.8 g/dL — ABNORMAL LOW (ref 12.0–15.0)
Immature Granulocytes: 1 %
Lymphocytes Relative: 20 %
Lymphs Abs: 1.6 10*3/uL (ref 0.7–4.0)
MCH: 29.5 pg (ref 26.0–34.0)
MCHC: 32.9 g/dL (ref 30.0–36.0)
MCV: 89.6 fL (ref 80.0–100.0)
Monocytes Absolute: 0.8 10*3/uL (ref 0.1–1.0)
Monocytes Relative: 10 %
Neutro Abs: 5.5 10*3/uL (ref 1.7–7.7)
Neutrophils Relative %: 69 %
Platelets: 246 10*3/uL (ref 150–400)
RBC: 3.66 MIL/uL — ABNORMAL LOW (ref 3.87–5.11)
RDW: 15.3 % (ref 11.5–15.5)
WBC: 8 10*3/uL (ref 4.0–10.5)
nRBC: 0 % (ref 0.0–0.2)

## 2023-07-01 LAB — I-STAT VENOUS BLOOD GAS, ED
Acid-Base Excess: 2 mmol/L (ref 0.0–2.0)
Bicarbonate: 25.3 mmol/L (ref 20.0–28.0)
Calcium, Ion: 1.12 mmol/L — ABNORMAL LOW (ref 1.15–1.40)
HCT: 33 % — ABNORMAL LOW (ref 36.0–46.0)
Hemoglobin: 11.2 g/dL — ABNORMAL LOW (ref 12.0–15.0)
O2 Saturation: 50 %
Potassium: 3.9 mmol/L (ref 3.5–5.1)
Sodium: 139 mmol/L (ref 135–145)
TCO2: 26 mmol/L (ref 22–32)
pCO2, Ven: 34.8 mmHg — ABNORMAL LOW (ref 44–60)
pH, Ven: 7.47 — ABNORMAL HIGH (ref 7.25–7.43)
pO2, Ven: 25 mmHg — CL (ref 32–45)

## 2023-07-01 LAB — COMPREHENSIVE METABOLIC PANEL
ALT: 9 U/L (ref 0–44)
AST: 19 U/L (ref 15–41)
Albumin: 2.9 g/dL — ABNORMAL LOW (ref 3.5–5.0)
Alkaline Phosphatase: 87 U/L (ref 38–126)
Anion gap: 10 (ref 5–15)
BUN: 7 mg/dL — ABNORMAL LOW (ref 8–23)
CO2: 24 mmol/L (ref 22–32)
Calcium: 8.5 mg/dL — ABNORMAL LOW (ref 8.9–10.3)
Chloride: 103 mmol/L (ref 98–111)
Creatinine, Ser: 0.84 mg/dL (ref 0.44–1.00)
GFR, Estimated: >60 mL/min (ref >60)
Glucose, Bld: 121 mg/dL — ABNORMAL HIGH (ref 70–99)
Potassium: 3.8 mmol/L (ref 3.5–5.1)
Sodium: 137 mmol/L (ref 135–145)
Total Bilirubin: 0.7 mg/dL (ref 0.3–1.2)
Total Protein: 6.8 g/dL (ref 6.5–8.1)

## 2023-07-01 LAB — SARS CORONAVIRUS 2 BY RT PCR: SARS Coronavirus 2 by RT PCR: NEGATIVE

## 2023-07-01 LAB — AMMONIA: Ammonia: <10 umol/L (ref 9–35)

## 2023-07-01 LAB — ETHANOL: Alcohol, Ethyl (B): <10 mg/dL (ref <10)

## 2023-07-01 LAB — I-STAT CG4 LACTIC ACID, ED: Lactic Acid, Venous: 0.9 mmol/L (ref 0.5–1.9)

## 2023-07-01 LAB — CBG MONITORING, ED: Glucose-Capillary: 119 mg/dL — ABNORMAL HIGH (ref 70–99)

## 2023-07-01 MED ORDER — SODIUM CHLORIDE 0.9 % IV BOLUS
1000.0000 mL | Freq: Once | INTRAVENOUS | Status: AC
  Administered 2023-07-01: 1000 mL via INTRAVENOUS

## 2023-07-01 NOTE — Discharge Instructions (Signed)
Follow up with your family doctor in the office.  Follow up with your orthopedist

## 2023-07-01 NOTE — ED Notes (Signed)
Attempted to stick PT twice to draw blood, she is a little agitated . Will try again with phlebotomy

## 2023-07-01 NOTE — ED Notes (Signed)
Fall risk Bundle in place

## 2023-07-01 NOTE — ED Triage Notes (Signed)
PT was BIB by [facility] from Home with a c/o of AMS. LKW WAS 2200 Last Night.She is normally a walker and can semi-explain things but today at 900am fanily noticed she wasn't acting herself.She can't follow commands,is mumbling and has no reflexes on the right and is dazed and confused.PT has been on a antibiotic for x 2months since knee surgery.Yelling and crying is her baseline when someone attempts to touch he ror move her.

## 2023-07-01 NOTE — ED Notes (Signed)
Patient transported to CT 

## 2023-07-01 NOTE — ED Provider Notes (Signed)
[place] EMERGENCY DEPARTMENT AT [facility] Provider Note   CSN: [id number] Arrival date & time: 07/01/23  1502     History  Chief Complaint  Patient presents with   Altered Mental Status    Monica Erickson is a 77 y.o. female.  77 yo F arrived as altered mental status.  Patient unable to provide any history.  Demented at baseline.  Level 5 caveat.  Per EMS the patient was unable to follow commands was mumbling and confused.  She had had recent knee surgery and been on antibiotics for a couple months.        Home Medications Prior to Admission medications   Medication Sig Start Date End Date Taking? Authorizing Provider  acetaminophen (TYLENOL) 325 MG tablet Take 2 tablets (650 mg total) by mouth every 6 (six) hours as needed for mild pain (or Fever >/= 101). 03/27/23   Rolly Salter, MD  acetaminophen (TYLENOL) 500 MG tablet Take 1 tablet (500 mg total) by mouth 2 (two) times daily. 03/27/23   Rolly Salter, MD  cyanocobalamin (VITAMIN B12) 1000 MCG/ML injection Inject 1,000 mcg into the muscle once a week.  08/05/24  [provider]  diazepam (VALIUM) 2 MG tablet Take 1 tablet (2 mg total) by mouth 2 (two) times daily as needed for anxiety. 03/27/23   Rolly Salter, MD  docusate sodium (COLACE) 100 MG capsule Take 1 capsule (100 mg total) by mouth 2 (two) times daily. 03/27/23   Rolly Salter, MD  doxycycline (VIBRAMYCIN) 25 MG/5ML SUSR Take 20 mLs (100 mg total) by mouth 2 (two) times daily. 04/14/23   Danelle Earthly, MD  feeding supplement (ENSURE ENLIVE / ENSURE PLUS) LIQD Take 237 mLs by mouth 3 (three) times daily between meals. 03/27/23   Rolly Salter, MD  lidocaine (LIDODERM) 5 % Place 1 patch onto the skin daily. Remove & Discard patch within 12 hours or as directed by MD 03/27/23   Rolly Salter, MD      Allergies    Patient has no known allergies.    Review of Systems   Review of Systems  Physical Exam Updated Vital Signs BP (!) 169/104    Pulse (!) 105   Temp 99.1 F (37.3 C) (Rectal)   Resp (!) 25   Ht 5\' 8"  (1.727 m)   Wt 73 kg   SpO2 100%   BMI 24.47 kg/m  Physical Exam Vitals and nursing note reviewed.  Constitutional:      General: She is not in acute distress.    Appearance: She is well-developed. She is not diaphoretic.  HENT:     Head: Normocephalic and atraumatic.  Eyes:     Pupils: Pupils are equal, round, and reactive to light.  Cardiovascular:     Rate and Rhythm: Normal rate and regular rhythm.     Heart sounds: No murmur heard.    No friction rub. No gallop.  Pulmonary:     Effort: Pulmonary effort is normal.     Breath sounds: No wheezing or rales.  Abdominal:     General: There is no distension.     Palpations: Abdomen is soft.     Tenderness: There is no abdominal tenderness.  Musculoskeletal:        General: No tenderness.     Cervical back: Normal range of motion and neck supple.  Skin:    General: Skin is warm and dry.  Neurological:     Mental Status:  She is alert and oriented to person, place, and time.  Psychiatric:        Behavior: Behavior normal.     ED Results / Procedures / Treatments   Labs (all labs ordered are listed, but only abnormal results are displayed) Labs Reviewed  CBC WITH DIFFERENTIAL/PLATELET - Abnormal; Notable for the following components:      Result Value   RBC 3.66 (*)    Hemoglobin 10.8 (*)    HCT 32.8 (*)    All other components within normal limits  URINALYSIS, ROUTINE W REFLEX MICROSCOPIC - Abnormal; Notable for the following components:   Protein, ur 30 (*)    All other components within normal limits  COMPREHENSIVE METABOLIC PANEL - Abnormal; Notable for the following components:   Glucose, Bld 121 (*)    BUN 7 (*)    Calcium 8.5 (*)    Albumin 2.9 (*)    All other components within normal limits  CBG MONITORING, ED - Abnormal; Notable for the following components:   Glucose-Capillary 119 (*)    All other components within normal  limits  I-STAT VENOUS BLOOD GAS, ED - Abnormal; Notable for the following components:   pH, Ven 7.470 (*)    pCO2, Ven 34.8 (*)    pO2, Ven 25 (*)    Calcium, Ion 1.12 (*)    HCT 33.0 (*)    Hemoglobin 11.2 (*)    All other components within normal limits  SARS CORONAVIRUS 2 BY RT PCR  AMMONIA  ETHANOL  I-STAT CG4 LACTIC ACID, ED    EKG EKG Interpretation Date/Time:  Wednesday July 01 2023 15:19:49 EDT Ventricular Rate:  106 PR Interval:  202 QRS Duration:  92 QT Interval:  329 QTC Calculation: 437 R Axis:   60  Text Interpretation: Sinus tachycardia Probable left atrial enlargement Borderline low voltage, extremity leads No significant change since last tracing Confirmed by Melene Plan [phone]) on 07/01/2023 3:28:13 PM  Radiology DG Knee Complete 4 Views Right  Result Date: 07/01/2023 CLINICAL DATA:  Knee pain EXAM: RIGHT KNEE - COMPLETE 4+ VIEW COMPARISON:  03/31/2023 FINDINGS: Prior right knee replacement. No hardware complicating feature. Small joint effusion. No acute bony abnormality. Specifically, no fracture, subluxation, or dislocation. IMPRESSION: Right knee replacement.  No acute bony abnormality. Small joint effusion. Electronically Signed   By: Charlett Nose M.D.   On: 07/01/2023 23:32   CT HEAD WO CONTRAST  Result Date: 07/01/2023 CLINICAL DATA:  Mental status change, persistent or worsening EXAM: CT HEAD WITHOUT CONTRAST TECHNIQUE: Contiguous axial images were obtained from the base of the skull through the vertex without intravenous contrast. RADIATION DOSE REDUCTION: This exam was performed according to the departmental dose-optimization program which includes automated exposure control, adjustment of the mA and/or kV according to patient size and/or use of iterative reconstruction technique. COMPARISON:  03/17/2023 FINDINGS: Brain: There is atrophy and chronic small vessel disease changes. No acute intracranial abnormality. Specifically, no hemorrhage, hydrocephalus,  mass lesion, acute infarction, or significant intracranial injury. Vascular: No hyperdense vessel or unexpected calcification. Skull: No acute calvarial abnormality. Sinuses/Orbits: No acute findings Other: None IMPRESSION: Atrophy, chronic microvascular disease. No acute intracranial abnormality. Electronically Signed   By: Charlett Nose M.D.   On: 07/01/2023 17:13   DG Chest Port 1 View  Result Date: 07/01/2023 CLINICAL DATA:  Altered mental status EXAM: PORTABLE CHEST 1 VIEW COMPARISON:  03/16/2023 FINDINGS: There is some linear opacity lung left lung base likely scar or atelectasis. No consolidation, pneumothorax  or effusion. No edema. Chronic lung changes. Normal cardiopericardial silhouette. Overlapping cardiac leads. Degenerative changes along the spine. IMPRESSION: Linear opacity left lung base likely scar or atelectasis. Chronic lung changes. No acute cardiopulmonary disease. Electronically Signed   By: Karen Kays M.D.   On: 07/01/2023 16:43    Procedures Procedures    Medications Ordered in ED Medications  sodium chloride 0.9 % bolus 1,000 mL (0 mLs Intravenous Stopped 07/01/23 2109)    ED Course/ Medical Decision Making/ A&P                                 Medical Decision Making Amount and/or Complexity of Data Reviewed Labs: ordered. Radiology: ordered.   77 yo F with a chief complaints of altered mental status.  Was last normal last night.  Patient unable to provide any history for me.  Will obtain an altered mental status workup.  Reassess.  No hypercarbia, UA without infection, lactate normal, chest x-Heinz independently interpreted by me without focal infiltrates or pneumothorax.  Ammonia level normal.  Patient continues to be at the similar mental status that she was previously.  Nursing received a phone call from the patient's family and they are mostly worried about her right knee.  Reportedly were worried about her mental status but were worried that she was not  bearing weight on that knee.  No obvious concerning finding on physical exam.  Plain film of the knee and the hip independently interpreted by me without obvious hardware displacement and no obvious fracture.  Will discharge her back to her facility.  11:35 PM:  I have discussed the diagnosis/risks/treatment options with the patient.  Evaluation and diagnostic testing in the emergency department does not suggest an emergent condition requiring admission or immediate intervention beyond what has been performed at this time.  They will follow up with PCP. We also discussed returning to the ED immediately if new or worsening sx occur. We discussed the sx which are most concerning (e.g., sudden worsening pain, fever, inability to tolerate by mouth) that necessitate immediate return. Medications administered to the patient during their visit and any new prescriptions provided to the patient are listed below.  Medications given during this visit Medications  sodium chloride 0.9 % bolus 1,000 mL (0 mLs Intravenous Stopped 07/01/23 2109)     The patient appears reasonably screen and/or stabilized for discharge and I doubt any other medical condition or other [facility] requiring further screening, evaluation, or treatment in the ED at this time prior to discharge.         Final Clinical Impression(s) / ED Diagnoses Final diagnoses:  Altered mental status, unspecified altered mental status type    Rx / DC Orders ED Discharge Orders     None         Melene Plan, DO 07/01/23 2335

## 2023-07-01 NOTE — Telephone Encounter (Signed)
Spoke with patient's daughter regarding Md message. State that patient is heading to [facility] via EMS due some concerns. This morning patient was not able to sit up without assistance and increase weakness.  Forwarding message to Dr. Thedore Mins.

## 2023-07-02 NOTE — ED Notes (Signed)
[facility] called. No ETA

## 2023-07-02 NOTE — ED Notes (Signed)
Called pts daughter to confirm family availability at home. Daughter sts she is home and waiting on the pt. Denies any additional questions. Sts someone has already spoken to her. Pt leaving [facility] ED at this time via [facility]

## 2023-07-07 ENCOUNTER — Other Ambulatory Visit: Payer: Self-pay

## 2023-07-07 ENCOUNTER — Telehealth: Payer: Self-pay

## 2023-07-07 ENCOUNTER — Telehealth (INDEPENDENT_AMBULATORY_CARE_PROVIDER_SITE_OTHER): Payer: Medicare Other | Admitting: Internal Medicine

## 2023-07-07 DIAGNOSIS — T8453XD Infection and inflammatory reaction due to internal right knee prosthesis, subsequent encounter: Secondary | ICD-10-CM

## 2023-07-07 MED ORDER — DOXYCYCLINE MONOHYDRATE 25 MG/5ML PO SUSR: 100.0000 mg | Freq: Two times a day (BID) | ORAL | 3 refills | Status: DC

## 2023-07-07 MED ORDER — FLUCONAZOLE 150 MG PO TABS: 150.0000 mg | ORAL_TABLET | Freq: Every day | ORAL | 0 refills | Status: AC

## 2023-07-07 NOTE — Telephone Encounter (Signed)
Received a call from the patient's daughter requesting an appointment. The patient is currently overdue for a follow-up visit, as noted in the last visit for an infection associated with the internal right knee prosthesis. The daughter states that the patient has recently been unable to tolerate doxycycline and continues to experience pain in the right knee, which is limiting her mobility. The daughter also mentioned that she will contact the PCP for follow-up PT orders. Offered appointment today has provider has open availability. Mychart visit accepted. Valarie Cones, LPN

## 2023-07-07 NOTE — Progress Notes (Signed)
Virtual Visit via Telephone/Video Note   I connected with Monica Erickson   On 07/07/2023 at 3:35 PM  by Video and verified that I am speaking with the correct person using two identifiers.   I discussed the limitations, risks, security and privacy concerns of performing an evaluation and management service by telephone and the availability of in person appointments. I also discussed with the patient that there may be a patient responsible charge related to this service. The patient expressed understanding and agreed to proceed.   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 pylori infection    Pulmonary embolism (HCC) 02/25/2022   Failure to thrive in adult 02/25/2022   Sepsis due to pneumonia (HCC) 02/25/2022   Hypokalemia 02/25/2022   AMS (altered mental status) 02/23/2022   Pseudophakia of both eyes 02/26/2021   Alzheimer's dementia (HCC) 09/24/2020   Generalized abdominal pain 02/17/2020   Poor appetite 02/17/2020   Weight loss 02/17/2020   Constipation 02/17/2020   Other hyperlipidemia 02/17/2020   Belching 02/17/2020   Stone, kidney 12/07/2018   Dementia without behavioral disturbance (HCC) 12/07/2018    Patient's Medications  New Prescriptions   No medications on file  Previous Medications   ACETAMINOPHEN (TYLENOL) 325 MG TABLET    Take 2 tablets (650 mg total) by mouth every 6 (six) hours as needed for mild pain (or Fever >/= 101).   ACETAMINOPHEN (TYLENOL) 500 MG TABLET    Take 1 tablet (500 mg total) by mouth 2 (two) times daily.   CYANOCOBALAMIN (VITAMIN B12) 1000 MCG/ML INJECTION    Inject 1,000 mcg into the muscle once a week.   DIAZEPAM (VALIUM) 2 MG TABLET    Take 1 tablet (2 mg total)  by mouth 2 (two) times daily as needed for anxiety.   DOCUSATE SODIUM (COLACE) 100 MG CAPSULE    Take 1 capsule (100 mg total) by mouth 2 (two) times daily.   DOXYCYCLINE (VIBRAMYCIN) 25 MG/5ML SUSR    Take 20 mLs (100 mg total) by mouth 2 (two) times daily.   FEEDING SUPPLEMENT (ENSURE ENLIVE / ENSURE PLUS) LIQD    Take 237 mLs by mouth 3 (three) times daily between meals.   LIDOCAINE (LIDODERM) 5 %    Place 1 patch onto the skin daily. Remove & Discard patch within 12 hours or as directed by MD  Modified Medications   No medications on file  Discontinued Medications   No medications on file    Subjective: Expand All Collapse All     Subjective:      Subjective[] Expand by Default Patient ID: Monica Erickson, female    DOB: Feb 06, 1946, 77 y.o.   MRN: [id number]         Virtual Visit via Telephone/Video Note   I connected with Monica Erickson  on 05/11/2023 at 2:57 PM by telephone and verified that I am speaking with the correct person using two identifiers.   I discussed the limitations, risks, security and privacy concerns of performing an evaluation and management service by telephone and the availability of in person appointments. I also discussed with the patient that there may be a patient responsible charge related to this  service. The patient expressed understanding and agreed to proceed.   Location:   Patient: Home Provider: [facility]     HPI:   Monica Erickson is a 77 y.o. female with past medical history as below presents for hospital follow-up of right knee PJI.  Admitted to [facility] 5/11-17.  Patient had initially undergone right TKA 15 years ago in [place].  She has advanced dementia and lives at home with family.  She has been complaining of right knee pain which led to admission 03/16/2023.  On arrival vital stable.   IR guided arthrocentesis showing 1100 WBC, 92% neutrophils and crystals, cultures with no growth.  Ortho engaged, underwent debridement with  poly exchange on 5/13 cultures no growth.  Discussed with Ortho that pseudogout without infection is rare in the setting of prosthetic joint.  Plan to treat with 6 weeks of antibiotics.  Discharged on Dapto and ceftriaxone x 6 weeks on the OR on 5/13 EOT 05/04/2023. 04/14/2023: Patient presents with aide and daughter.  Tolerating antibiotics.  They do note that she is very difficult to get to take pills for any p.o. suppression.   05/11/23 Video Visit: Doing well. Daughter helping hip video visit. They note that due to cost unable to get Liquid doxy->taking PO doxy. Note that knee feels and looks healed although unable to fully ambulate due to other health conditions      07/07/23: daughter states pth has worsening knee pain. She was seen in the ED 8/21 with confusion. Xray knee showed small joint effsuion.  Review of Systems: Review of Systems  All other systems reviewed and are negative.   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 pylori infection 03/2020   Kidney stones    Night terrors    Sleep apnea    no cpap    Social History   Tobacco Use   Smoking status: Former    Types: Cigarettes   Smokeless tobacco: Never  Vaping Use   Vaping status: Never Used  Substance Use Topics   Alcohol use: Not Currently    Comment: occasional wine   Drug use: Never    Family History  Problem Relation Age of Onset   Diabetes Mother    Hypertension Father    Colon cancer Neg Hx    Esophageal cancer Neg Hx    Stomach cancer Neg Hx    Rectal cancer Neg Hx     No Known Allergies  Health Maintenance  Topic Date Due   COVID-19 Vaccine (1) Never done   Hepatitis C Screening  Never done   DTaP/Tdap/Td (1 - Tdap) Never done   Zoster Vaccines- Shingrix (1 of 2) Never done   Pneumonia Vaccine 73+ Years old (1 of  1 - PCV) Never done   DEXA SCAN  Never done   Medicare Annual Wellness (AWV)  12/14/2020   INFLUENZA VACCINE  06/11/2023   HPV VACCINES  Aged Out   Colonoscopy  Discontinued     Lab Results Lab Results  Component Value Date   WBC 8.0 07/01/2023   HGB 11.2 (L) 07/01/2023   HCT 33.0 (L) 07/01/2023   MCV 89.6 07/01/2023   PLT 246 07/01/2023    Lab Results  Component Value  Date   CREATININE 0.84 07/01/2023   BUN 7 (L) 07/01/2023   NA 139 07/01/2023   K 3.9 07/01/2023   CL 103 07/01/2023   CO2 24 07/01/2023    Lab Results  Component Value Date   ALT 9 07/01/2023   AST 19 07/01/2023   ALKPHOS 87 07/01/2023   BILITOT 0.7 07/01/2023    No results found for: "CHOL", "HDL", "LDLCALC", "LDLDIRECT", "TRIG", "CHOLHDL" No results found for: "LABRPR", "RPRTITER" No results found for: "HIV1RNAQUANT", "HIV1RNAVL", "CD4TABS"   Problem List Items Addressed This Visit   None  Assessment/Plan #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  Cultures with no growth - Taken to the OR on 5/13 for debridement palliation with cultures no growth - We discussed that pseudogout without infection in the prosthetic joint is rare.  As such plan to treat with 6 weeks of Dapto and ceftriaxone EOT 05/04/2023 which she took through 6/18 due PICC issues(pt pulled). Took linezolid and doxy->6/24  and now on doxy pills for suppression(wanted doxy liquid but cost prohibitive). Tolerating doxy.   -Daughter states pt went to ED on 8/21 with knee pian, which has woresned. Pt has demential sounable to fully assess Plan: -Follow-up in one month in person with CT results. If knee pain worsens  or devlops fever chills, worsening nausea then  go to ED. -   #Nausea -Pt has been nauseous, vomited once -Doxy  liquid  requested   #yeast infeciton -fluconzale 1050mg  x1    Danelle Earthly, MD [facility] 07/07/2023, 3:35 PM   I have personally spent 35 minutes involved in face-to-face and non-face-to-face activities for this patient on the day of the visit. Professional time spent includes the following activities: Preparing to see the patient (review of tests), Obtaining and/or reviewing separately obtained history (admission/discharge record), Performing a medically appropriate examination and/or evaluation , Ordering medications/tests/procedures, referring and communicating with other health care professionals, Documenting clinical information in the EMR, Independently interpreting results (not separately reported), Communicating results to the patient/family/caregiver, Counseling and educating the patient/family/caregiver and Care coordination (not separately reported).

## 2023-07-14 ENCOUNTER — Emergency Department (HOSPITAL_COMMUNITY): Payer: Medicare Other

## 2023-07-14 ENCOUNTER — Emergency Department (HOSPITAL_COMMUNITY)
Admission: EM | Admit: 2023-07-14 | Discharge: 2023-08-11 | Disposition: E | Payer: Medicare Other | Attending: Emergency Medicine | Admitting: Emergency Medicine

## 2023-07-14 DIAGNOSIS — R000 Tachycardia, unspecified: Secondary | ICD-10-CM | POA: Diagnosis not present

## 2023-07-14 DIAGNOSIS — I469 Cardiac arrest, cause unspecified: Secondary | ICD-10-CM | POA: Insufficient documentation

## 2023-07-14 LAB — CBC
HCT: 39.4 % (ref 36.0–46.0)
Hemoglobin: 11.7 g/dL — ABNORMAL LOW (ref 12.0–15.0)
MCH: 28.7 pg (ref 26.0–34.0)
MCHC: 29.7 g/dL — ABNORMAL LOW (ref 30.0–36.0)
MCV: 96.8 fL (ref 80.0–100.0)
Platelets: 220 10*3/uL (ref 150–400)
RBC: 4.07 MIL/uL (ref 3.87–5.11)
RDW: 15.8 % — ABNORMAL HIGH (ref 11.5–15.5)
WBC: 19.2 10*3/uL — ABNORMAL HIGH (ref 4.0–10.5)
nRBC: 0.2 % (ref 0.0–0.2)

## 2023-07-14 LAB — I-STAT CHEM 8, ED
BUN: 106 mg/dL — ABNORMAL HIGH (ref 8–23)
Calcium, Ion: 1.06 mmol/L — ABNORMAL LOW (ref 1.15–1.40)
Chloride: 119 mmol/L — ABNORMAL HIGH (ref 98–111)
Creatinine, Ser: 5.6 mg/dL — ABNORMAL HIGH (ref 0.44–1.00)
Glucose, Bld: 201 mg/dL — ABNORMAL HIGH (ref 70–99)
HCT: 37 % (ref 36.0–46.0)
Hemoglobin: 12.6 g/dL (ref 12.0–15.0)
Potassium: 3.9 mmol/L (ref 3.5–5.1)
Sodium: 153 mmol/L — ABNORMAL HIGH (ref 135–145)
TCO2: 16 mmol/L — ABNORMAL LOW (ref 22–32)

## 2023-07-14 LAB — TROPONIN I (HIGH SENSITIVITY): Troponin I (High Sensitivity): 771 ng/L (ref <18)

## 2023-07-14 LAB — CBG MONITORING, ED
Glucose-Capillary: 159 mg/dL — ABNORMAL HIGH (ref 70–99)
Glucose-Capillary: 164 mg/dL — ABNORMAL HIGH (ref 70–99)

## 2023-07-14 LAB — BASIC METABOLIC PANEL
Anion gap: 26 — ABNORMAL HIGH (ref 5–15)
BUN: 98 mg/dL — ABNORMAL HIGH (ref 8–23)
CO2: 13 mmol/L — ABNORMAL LOW (ref 22–32)
Calcium: 8.7 mg/dL — ABNORMAL LOW (ref 8.9–10.3)
Chloride: 112 mmol/L — ABNORMAL HIGH (ref 98–111)
Creatinine, Ser: 5.52 mg/dL — ABNORMAL HIGH (ref 0.44–1.00)
GFR, Estimated: 8 mL/min — ABNORMAL LOW (ref >60)
Glucose, Bld: 205 mg/dL — ABNORMAL HIGH (ref 70–99)
Potassium: 3.9 mmol/L (ref 3.5–5.1)
Sodium: 151 mmol/L — ABNORMAL HIGH (ref 135–145)

## 2023-07-14 LAB — I-STAT CG4 LACTIC ACID, ED: Lactic Acid, Venous: 10.7 mmol/L (ref 0.5–1.9)

## 2023-07-14 LAB — PROTIME-INR
INR: 1.7 — ABNORMAL HIGH (ref 0.8–1.2)
Prothrombin Time: 20.6 s — ABNORMAL HIGH (ref 11.4–15.2)

## 2023-07-14 MED ORDER — MIDAZOLAM HCL 2 MG/2ML IJ SOLN: 1.0000 mg | INTRAMUSCULAR | Status: DC | PRN

## 2023-07-14 MED ORDER — SODIUM BICARBONATE 8.4 % IV SOLN
INTRAVENOUS | Status: DC | PRN
  Administered 2023-07-14: 50 meq via INTRAVENOUS

## 2023-07-14 MED ORDER — FENTANYL CITRATE PF 50 MCG/ML IJ SOSY
25.0000 ug | PREFILLED_SYRINGE | Freq: Once | INTRAMUSCULAR | Status: DC
  Filled 2023-07-14: qty 1

## 2023-07-14 MED ORDER — FENTANYL BOLUS VIA INFUSION: 25.0000 ug | INTRAVENOUS | Status: DC | PRN

## 2023-07-14 MED ORDER — FENTANYL 2500MCG IN NS 250ML (10MCG/ML) PREMIX INFUSION
25.0000 ug/h | INTRAVENOUS | Status: DC
  Filled 2023-07-14: qty 250

## 2023-07-14 MED ORDER — EPINEPHRINE 1 MG/10ML IJ SOSY
PREFILLED_SYRINGE | INTRAMUSCULAR | Status: DC | PRN
  Administered 2023-07-14 (×2): 1 mg via INTRAVENOUS

## 2023-07-14 MED ORDER — EPINEPHRINE HCL 5 MG/250ML IV SOLN IN NS
0.5000 ug/min | INTRAVENOUS | Status: DC
  Administered 2023-07-14: 5 ug/min via INTRAVENOUS

## 2023-07-14 MED ORDER — CALCIUM CHLORIDE 10 % IV SOLN
INTRAVENOUS | Status: DC | PRN
Start: 2023-07-14 — End: 2023-07-14
  Administered 2023-07-14: 1 g via INTRAVENOUS

## 2023-07-15 ENCOUNTER — Ambulatory Visit (HOSPITAL_COMMUNITY): Payer: Medicare Other

## 2023-08-11 NOTE — ED Provider Notes (Signed)
[facility] EMERGENCY DEPARTMENT AT [facility] Provider Note   CSN: [id number] Arrival date & time: 07/12/2023  0820     History  No chief complaint on file.   Monica Erickson is a 77 y.o. female.  HPI   77 year old female presents to the emergency department post CPR.  Patient intubated prior to arrival.  Report from EMS is that family saw her well this morning, then went to check on her in the bathroom.  Reported downtime possibly 5 minutes.  EMS arrived to fire department doing chest compressions.  Patient received epinephrine and epi drip prior to arrival with ROSC.  Patient arrives with a humeral and tibial IO, tachycardic, intubated, unresponsive, no family at bedside.  Noted to be full code in the chart.  Home Medications Prior to Admission medications   Medication Sig Start Date End Date Taking? Authorizing Provider  acetaminophen (TYLENOL) 325 MG tablet Take 2 tablets (650 mg total) by mouth every 6 (six) hours as needed for mild pain (or Fever >/= 101). 03/27/23   Rolly Salter, MD  acetaminophen (TYLENOL) 500 MG tablet Take 1 tablet (500 mg total) by mouth 2 (two) times daily. 03/27/23   Rolly Salter, MD  cyanocobalamin (VITAMIN B12) 1000 MCG/ML injection Inject 1,000 mcg into the muscle once a week.  08/05/24  [provider]  diazepam (VALIUM) 2 MG tablet Take 1 tablet (2 mg total) by mouth 2 (two) times daily as needed for anxiety. 03/27/23   Rolly Salter, MD  docusate sodium (COLACE) 100 MG capsule Take 1 capsule (100 mg total) by mouth 2 (two) times daily. 03/27/23   Rolly Salter, MD  doxycycline (VIBRAMYCIN) 25 MG/5ML SUSR Take 20 mLs (100 mg total) by mouth 2 (two) times daily. 07/07/23   Danelle Earthly, MD  feeding supplement (ENSURE ENLIVE / ENSURE PLUS) LIQD Take 237 mLs by mouth 3 (three) times daily between meals. 03/27/23   Rolly Salter, MD  lidocaine (LIDODERM) 5 % Place 1 patch onto the skin daily. Remove & Discard patch within 12 hours or as  directed by MD 03/27/23   Rolly Salter, MD      Allergies    Patient has no known allergies.    Review of Systems   Review of Systems  Unable to perform ROS: Intubated    Physical Exam Updated Vital Signs BP 106/67   Pulse (!) 125   Resp 15   SpO2 95%  Physical Exam HENT:     Head: Normocephalic.     Right Ear: External ear normal.     Left Ear: External ear normal.     Nose: Nose normal.     Mouth/Throat:     Mouth: Mucous membranes are dry.  Eyes:     Comments: Pupils constricted, 2 mm bilaterally nonreactive  Cardiovascular:     Rate and Rhythm: Tachycardia present.  Pulmonary:     Comments: Intubated with bilateral breath sounds Abdominal:     General: Abdomen is flat. There is no distension.  Musculoskeletal:        General: No deformity.  Skin:    General: Skin is warm.  Neurological:     Comments: Intubated and unresponsive     ED Results / Procedures / Treatments   Labs (all labs ordered are listed, but only abnormal results are displayed) Labs Reviewed  CBC - Abnormal; Notable for the following components:      Result Value   WBC 19.2 (*)  Hemoglobin 11.7 (*)    MCHC 29.7 (*)    RDW 15.8 (*)    All other components within normal limits  PROTIME-INR - Abnormal; Notable for the following components:   Prothrombin Time 20.6 (*)    INR 1.7 (*)    All other components within normal limits  CBG MONITORING, ED - Abnormal; Notable for the following components:   Glucose-Capillary 159 (*)    All other components within normal limits  I-STAT CHEM 8, ED - Abnormal; Notable for the following components:   Sodium 153 (*)    Chloride 119 (*)    BUN 106 (*)    Creatinine, Ser 5.60 (*)    Glucose, Bld 201 (*)    Calcium, Ion 1.06 (*)    TCO2 16 (*)    All other components within normal limits  I-STAT CG4 LACTIC ACID, ED - Abnormal; Notable for the following components:   Lactic Acid, Venous 10.7 (*)    All other components within normal limits  CBG  MONITORING, ED - Abnormal; Notable for the following components:   Glucose-Capillary 164 (*)    All other components within normal limits  BASIC METABOLIC PANEL  SAMPLE TO BLOOD BANK  TROPONIN I (HIGH SENSITIVITY)    EKG EKG Interpretation Date/Time:  Tuesday July 14 2023 08:27:47 EDT Ventricular Rate:  111 PR Interval:  135 QRS Duration:  147 QT Interval:  385 QTC Calculation: 519 R Axis:   -85  Text Interpretation: Sinus tachycardia Multiple premature complexes, vent & supraven Right bundle branch block Borderline ST depression, lateral leads Confirmed by Coralee Pesa [phone]) on 07/29/2023 9:09:11 AM  Radiology DG Chest Portable 1 View  Result Date: 08/10/2023 CLINICAL DATA:  ETT placement EXAM: PORTABLE CHEST 1 VIEW COMPARISON:  Chest x-Whittaker dated July 01, 2023 FINDINGS: ETT tip is 6 cm from the carina. Cardiac and mediastinal contours are within normal limits. New small left pneumothorax. No definite mediastinal shift. Mild new left chest wall gas. No pleural effusion. No airspace opacity. Unchanged mild elevation of the right hemidiaphragm. IMPRESSION: 1. New small left pneumothorax and left chest wall gas. 2. ETT tip is 6.0 cm from the carina. Critical Value/emergent results were called by telephone at the time of interpretation on 07/28/2023 at 9:10 am to provider [facility] , who verbally acknowledged these results. Electronically Signed   By: Allegra Lai M.D.   On: 07/30/2023 09:12    Procedures .Critical Care  Performed by: Rozelle Logan, DO Authorized by: Rozelle Logan, DO   Critical care provider statement:    Critical care time (minutes):  50   Critical care time was exclusive of:  Separately billable procedures and treating other patients   Critical care was necessary to treat or prevent imminent or life-threatening deterioration of the following conditions:  Cardiac failure and respiratory failure   Critical care was time spent personally by me on  the following activities:  Development of treatment plan with patient or surrogate, discussions with consultants, evaluation of patient's response to treatment, examination of patient, ordering and review of laboratory studies, ordering and review of radiographic studies, ordering and performing treatments and interventions, pulse oximetry, re-evaluation of patient's condition and review of old charts   I assumed direction of critical care for this patient from another provider in my specialty: no       Medications Ordered in ED Medications  EPINEPHrine (ADRENALIN) 5 mg in NS 250 mL (0.02 mg/mL) premix infusion (0 mcg/min Intravenous Stopped 07/25/2023  1914)  fentaNYL (SUBLIMAZE) injection 25 mcg (has no administration in time range)  fentaNYL in NS (52mcg/ml) infusion-PREMIX (has no administration in time range)  fentaNYL (SUBLIMAZE) bolus via infusion 25-100 mcg (has no administration in time range)  midazolam (VERSED) injection 1-2 mg (has no administration in time range)  EPINEPHrine (ADRENALIN) 1 MG/10ML injection (1 mg Intravenous Given 08/04/2023 0848)  calcium chloride injection (1 g Intravenous Given 07/23/2023 0849)  sodium bicarbonate injection (50 mEq Intravenous Given 07/28/2023 0851)    ED Course/ Medical Decision Making/ A&P                                 Medical Decision Making Amount and/or Complexity of Data Reviewed Labs: ordered. Radiology: ordered.  Risk Prescription drug management.   77 year old female presents emergency department post CPR, intubated.  Report from EMS is that she was seen well this morning and then found down in the bathroom.  Unknown downtime.  On arrival fingerstick is appropriate, she has bilateral breath sounds, confirmed with chest x-Kerney that does show small left pneumothorax, most likely from CPR but no tension.  During evaluation patient lost pulses.  We began CPR and ACLS protocol.  New IV access was obtained.  She was given  epinephrine and the epinephrine drip was restarted.  We achieved ROSC.  Initial blood work was sent off however patient lost pulses again.  CPR and ACLS was restarted.  I was able to call one of the family members on the phone, they were en route to the hospital, confirmed her wishes of being full code.  Repeat fingerstick is stable.  CPR was continued.  She was PEA at pulse checks with no rhythm change, no shockable rhythm.  CPR was continued until efforts were futile.  CPR was stopped and time of death was pronounced at 848.  No cardiac activity on bedside ultrasound.  Family arrived and I spoke with him personally.  All questions answered.  Initial blood work showing acute renal failure and leukocytosis when compared to blood work a week ago.  ME declined the case.        Final Clinical Impression(s) / ED Diagnoses Final diagnoses:  None    Rx / DC Orders ED Discharge Orders     None         Rozelle Logan, DO 07/12/2023 1001

## 2023-08-11 NOTE — ED Triage Notes (Signed)
Pt BIB [facility] as a post CPR.  Pt heard screaming in her room.  Family checked on pt approx. 5 min after noticing that she wasn't screaming anymore and found her unresponsive.    EMS gave 1 shock and 3 Epi and of NS.  They achieved ROSC twice and lost pulses once.  Pt was on an EPI drip at 29mcg/min and had pulses on arrival to ED.  BP 127/89 on arrival, HR ST around 105.

## 2023-08-11 NOTE — ED Notes (Signed)
Patient placement called to verify Post Mortem

## 2023-08-11 DEATH — deceased

## 2023-09-02 IMAGING — DX DG ABDOMEN 1V
2 series · 2 of 2 positions shown · non-contrast
Comparison: KUB 12/03/2019

CLINICAL DATA: Evaluate for constipation. Lower abdominal pain and
constipation for 2-3 days.

EXAM:
ABDOMEN - 1 VIEW

[abdomen kub (1 of 2)]
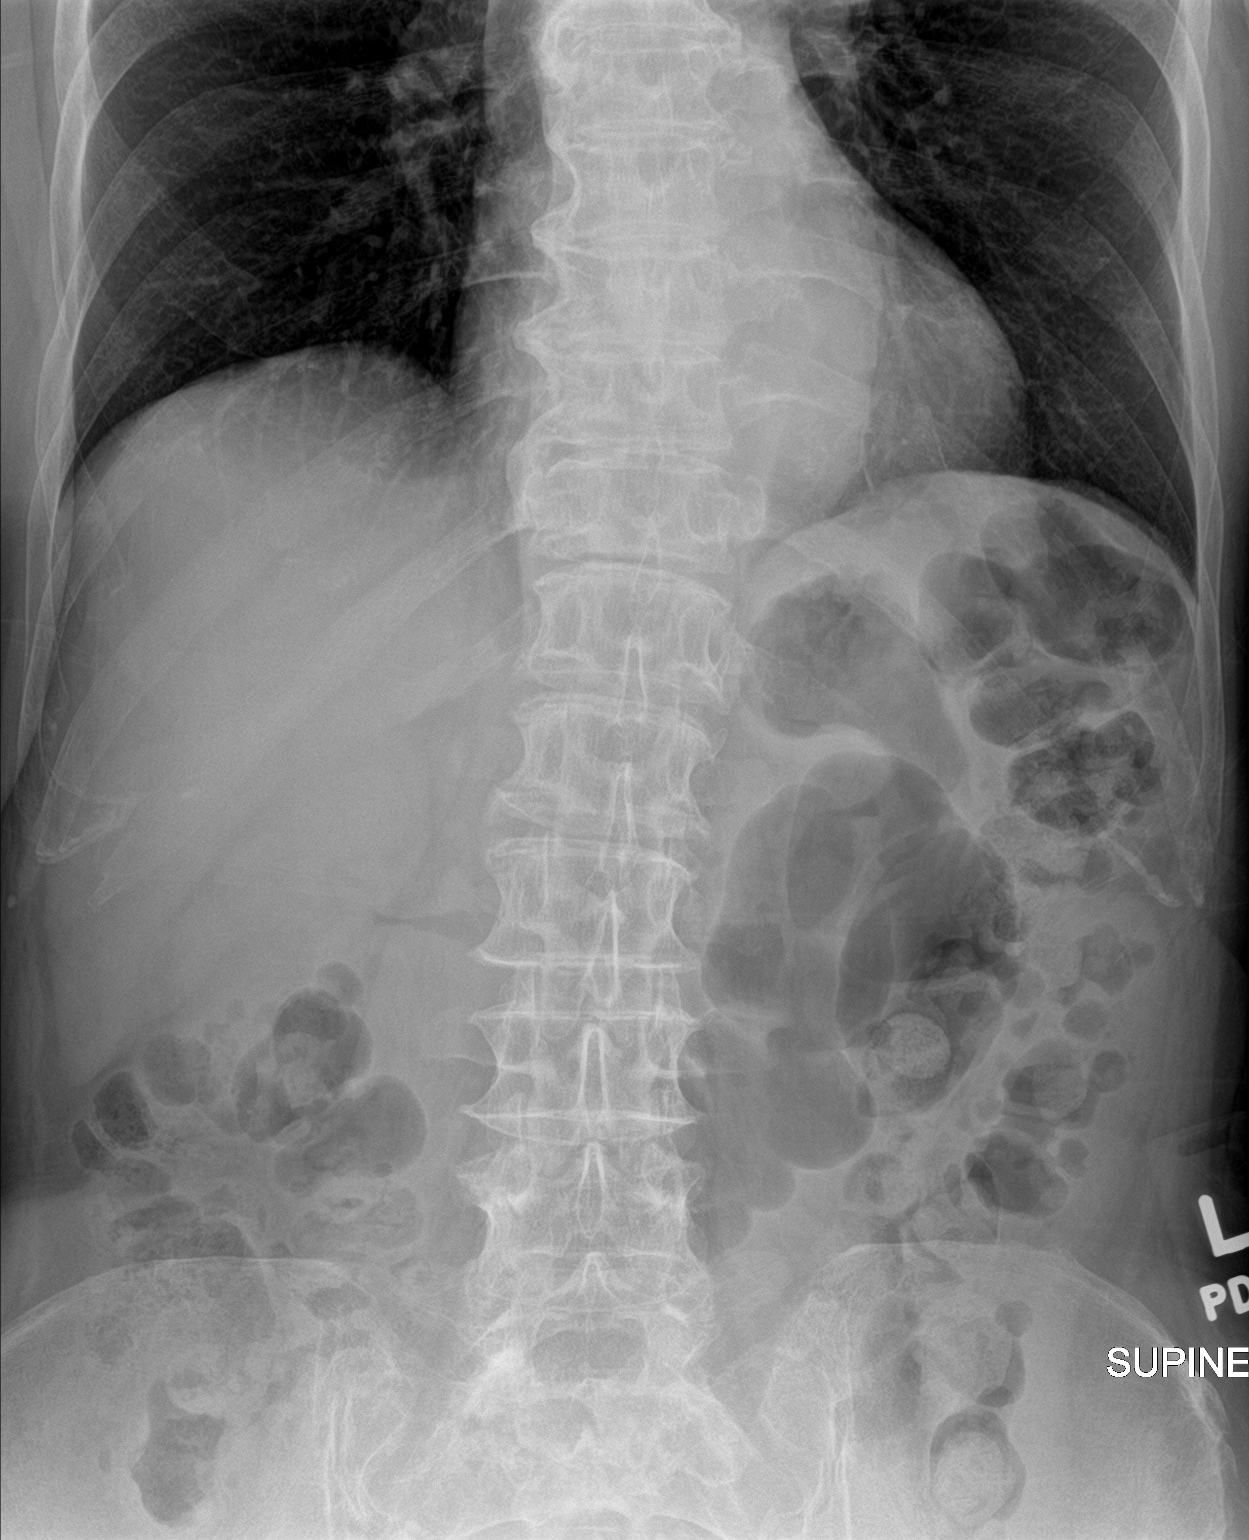

[abdomen kub (2 of 2)]
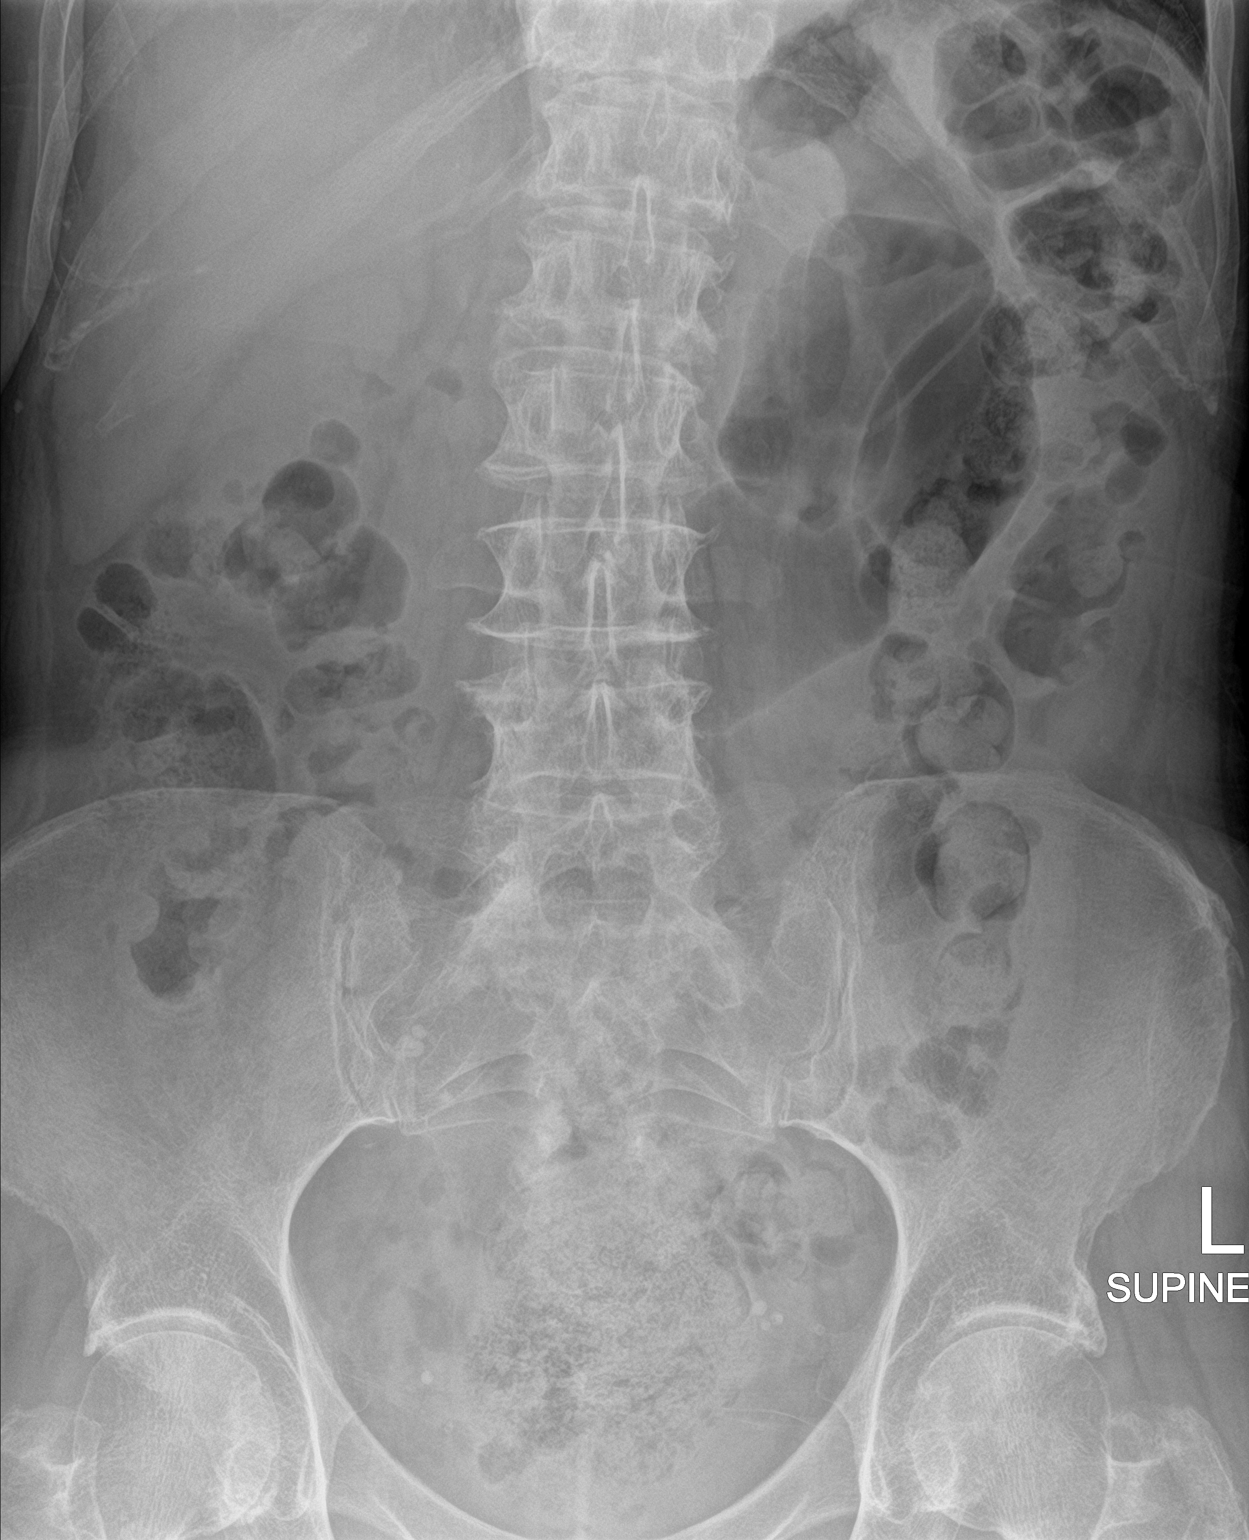

[2 of 2 positions shown; findings below may reference images not displayed]

FINDINGS: Stool is seen within the ascending, distal transverse, descending,
and rectosigmoid colon similar to prior. No dilated loops of bowel
to indicate bowel obstruction. No portal venous gas or pneumatosis.
No definite pneumoperitoneum. Vascular phleboliths are again seen
overlying the pelvis.

Mild degenerative disc changes of the lumbar spine. The lung bases
are clear.
IMPRESSION: No significant change in moderate stool throughout the colon.

## 2023-11-06 IMAGING — CT CT ABD-PELV W/O CM
2 of 4 series · 16 of 46 positions shown, 18 images · non-contrast
Comparison: 12/03/2019

CLINICAL DATA: Acute abdominal pain



[Series 3: ap without · axial · non-contrast · 0.68mm/px · z∈[+623,+1033]mm · 13 of 92 slices shown, 15 images]
[im 5/92  soft-tissue]
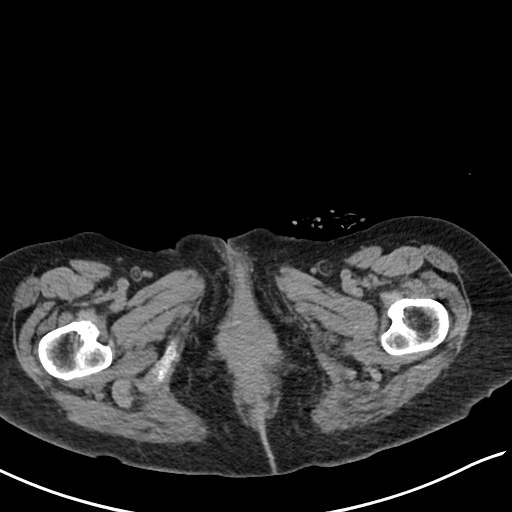
[im 5/92  bone]
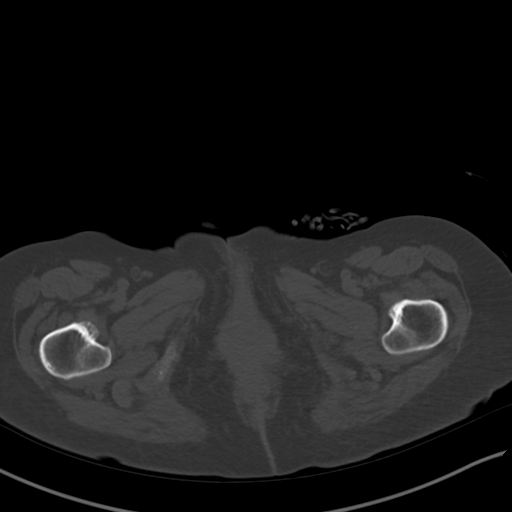
[im 15/92  soft-tissue]
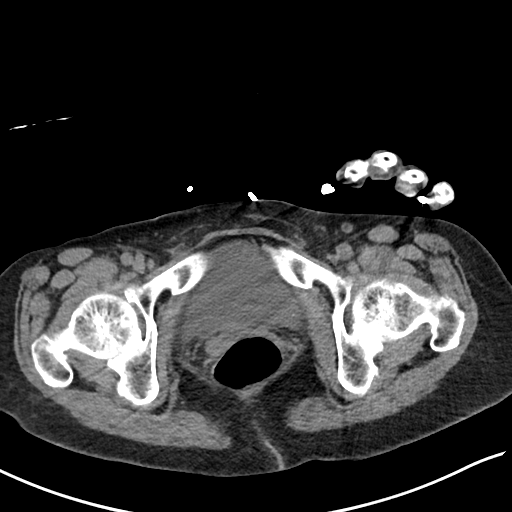
[im 20/92  soft-tissue]
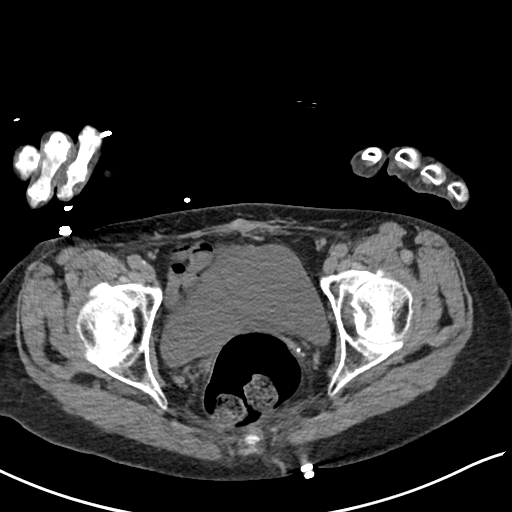
[im 24/92  soft-tissue]
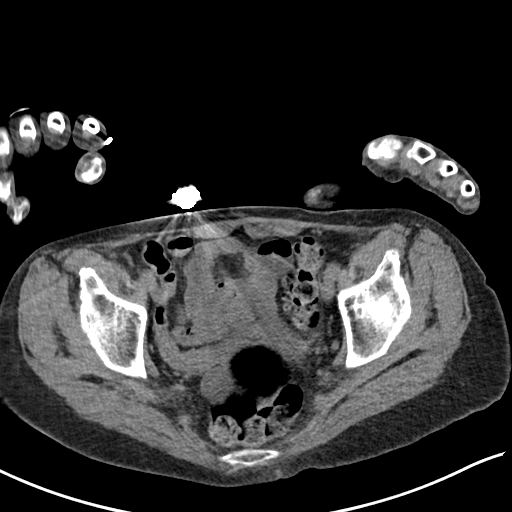
[im 34/92  soft-tissue]
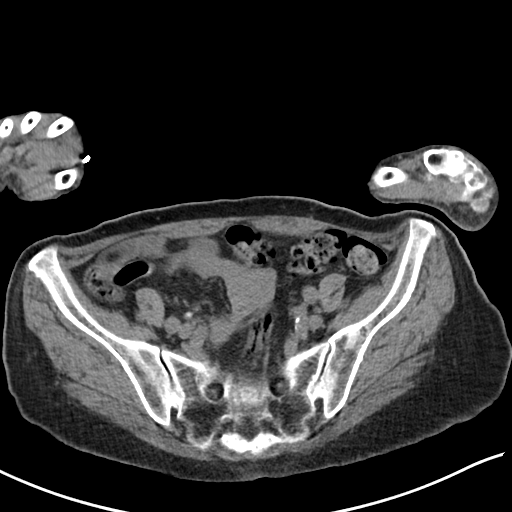
[im 39/92  soft-tissue]
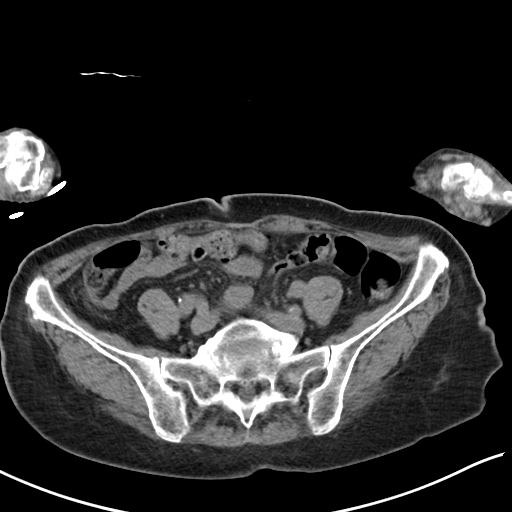
[im 48/92  soft-tissue]
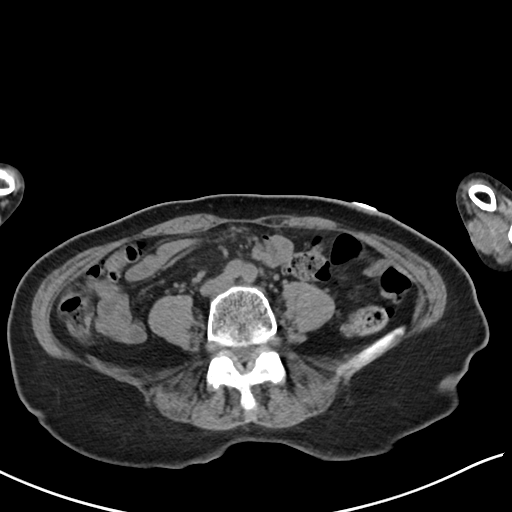
[im 53/92  soft-tissue]
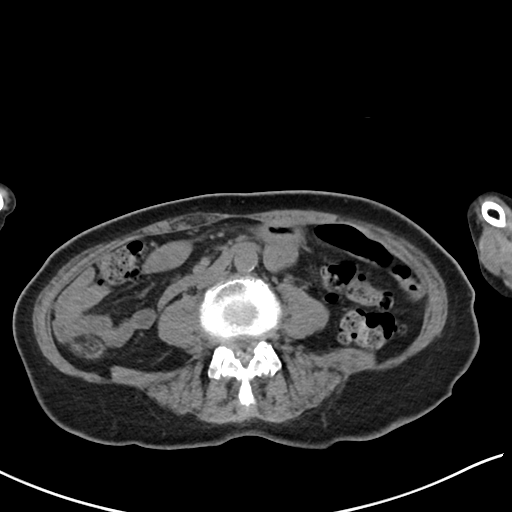
[im 58/92  soft-tissue]
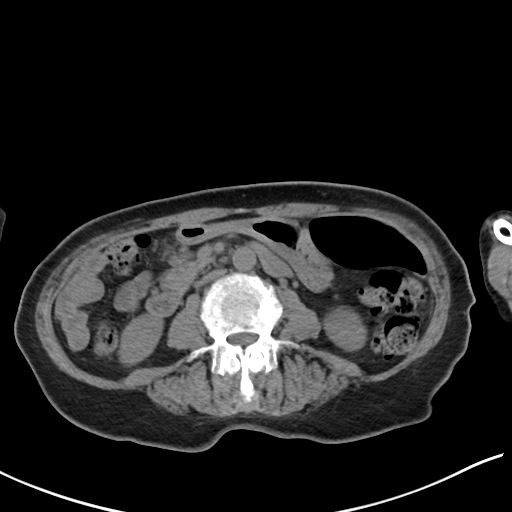
[im 58/92  bone]
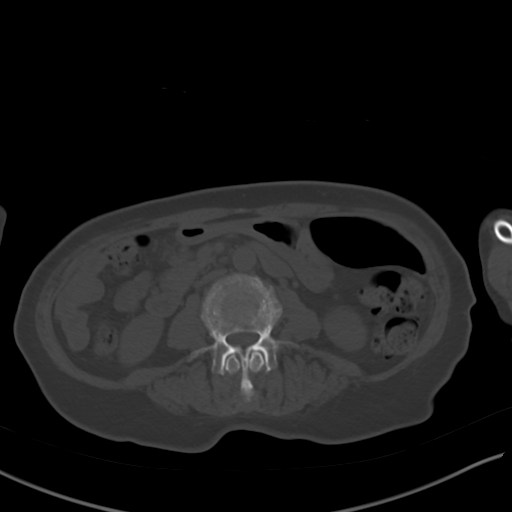
[im 68/92  soft-tissue]
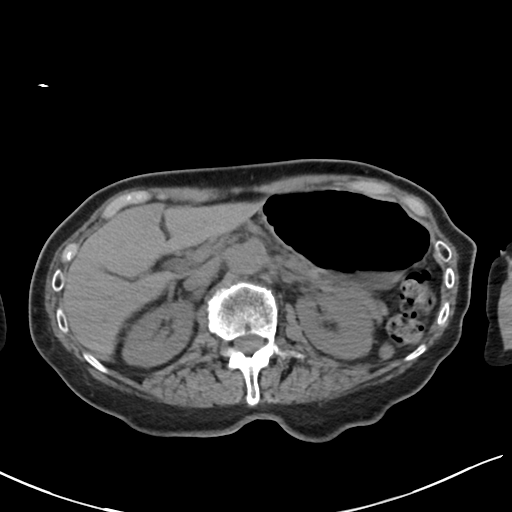
[im 72/92  soft-tissue]
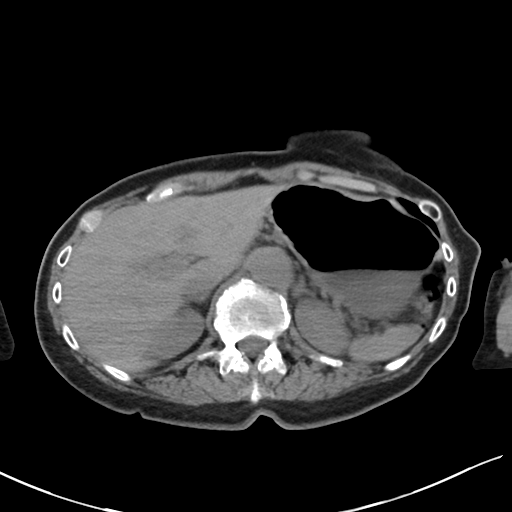
[im 77/92  soft-tissue]
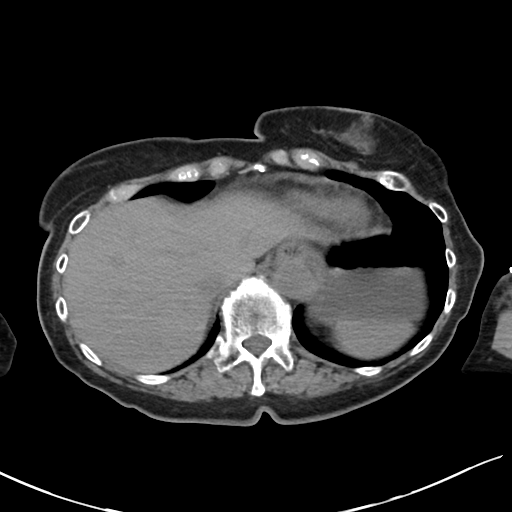
[im 87/92  soft-tissue]
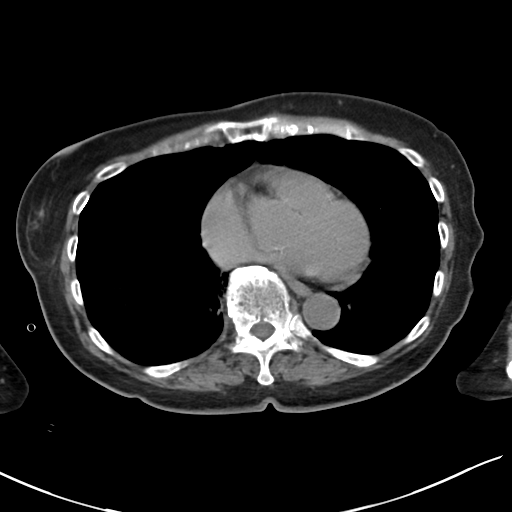

[Series 6: cor · coronal · 0.67mm/px · 3 of 90 slices shown]
[im 30/90  soft-tissue]
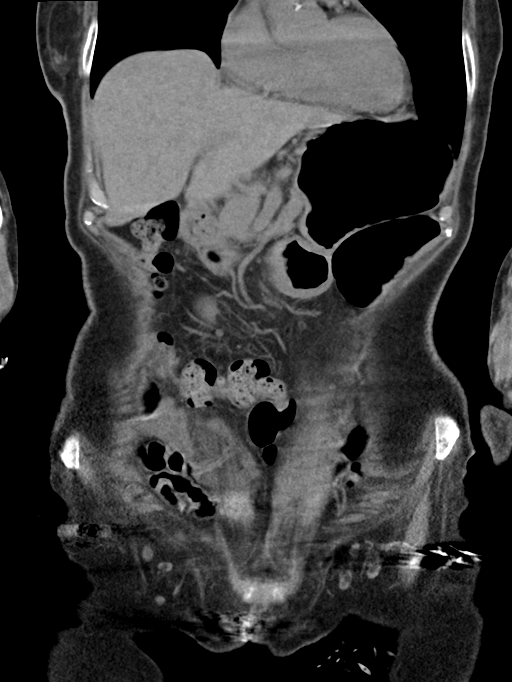
[im 40/90  soft-tissue]
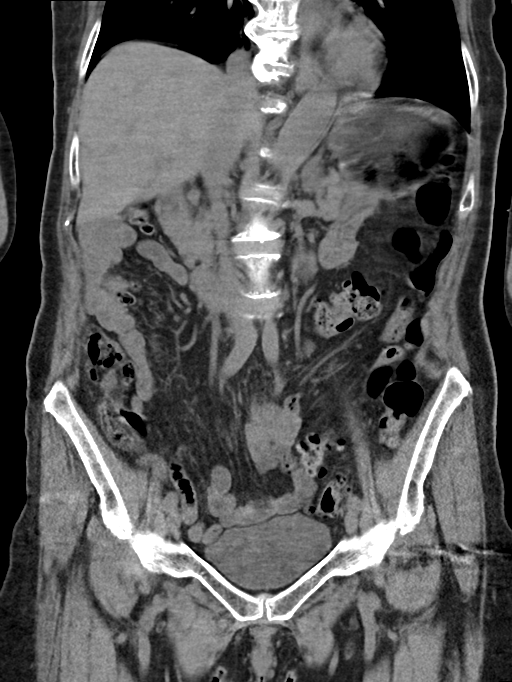
[im 50/90  soft-tissue]
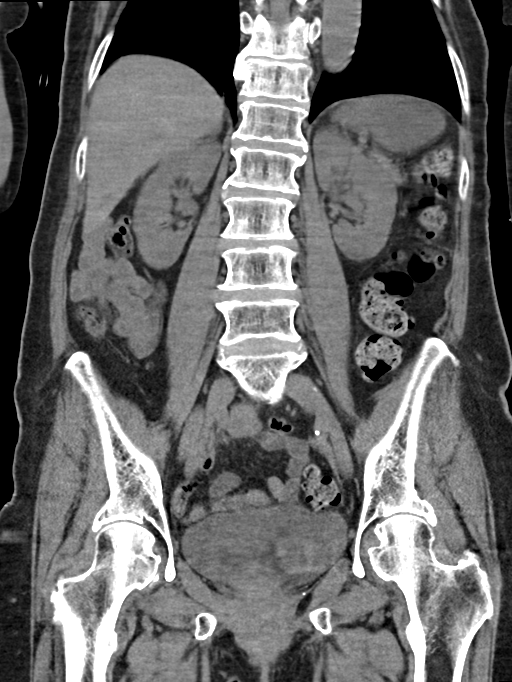

[16 of 46 positions shown; findings below may reference images not displayed]

FINDINGS: Lower Chest: Normal.

Hepatobiliary: Normal hepatic contours. No intra- or extrahepatic
biliary dilatation. The gallbladder is normal.

Pancreas: Normal pancreas. No ductal dilatation or peripancreatic
fluid collection.

Spleen: Normal.

Adrenals/Urinary Tract: The adrenal glands are normal. No
hydronephrosis, nephroureterolithiasis or solid renal mass. The
urinary bladder is normal for degree of distention

Stomach/Bowel: There is no hiatal hernia. Normal duodenal course and
caliber. No small bowel dilatation or inflammation. No focal colonic
abnormality. Normal appendix.

Vascular/Lymphatic: There is calcific atherosclerosis of the
abdominal aorta. No lymphadenopathy.

Reproductive: Normal uterus. No adnexal mass.

Other: None.

Musculoskeletal: No bony spinal canal stenosis or focal osseous
abnormality.
IMPRESSION: 1. No acute abnormality of the abdomen or pelvis.

Aortic Atherosclerosis ([id number]).
